# Patient Record
Sex: Female | Born: 1935 | Race: White | Hispanic: No | Smoking: Never smoker
Health system: Southern US, Community
[De-identification: ages and names within clinical notes are randomized; demographics above are authoritative.]

## PROBLEM LIST (undated history)

## (undated) DIAGNOSIS — Z9109 Other allergy status, other than to drugs and biological substances: Secondary | ICD-10-CM

## (undated) DIAGNOSIS — Z9221 Personal history of antineoplastic chemotherapy: Secondary | ICD-10-CM

## (undated) DIAGNOSIS — E7800 Pure hypercholesterolemia, unspecified: Secondary | ICD-10-CM

## (undated) DIAGNOSIS — I10 Essential (primary) hypertension: Secondary | ICD-10-CM

## (undated) DIAGNOSIS — C4490 Unspecified malignant neoplasm of skin, unspecified: Secondary | ICD-10-CM

## (undated) DIAGNOSIS — C189 Malignant neoplasm of colon, unspecified: Secondary | ICD-10-CM

## (undated) DIAGNOSIS — C50919 Malignant neoplasm of unspecified site of unspecified female breast: Secondary | ICD-10-CM

## (undated) DIAGNOSIS — C8580 Other specified types of non-Hodgkin lymphoma, unspecified site: Secondary | ICD-10-CM

## (undated) DIAGNOSIS — C8590 Non-Hodgkin lymphoma, unspecified, unspecified site: Secondary | ICD-10-CM

## (undated) HISTORY — DX: Malignant neoplasm of unspecified site of unspecified female breast: C50.919

## (undated) HISTORY — DX: Other specified types of non-hodgkin lymphoma, unspecified site: C85.80

## (undated) HISTORY — DX: Pure hypercholesterolemia, unspecified: E78.00

## (undated) HISTORY — DX: Essential (primary) hypertension: I10

## (undated) HISTORY — PX: COLONOSCOPY: SHX174

## (undated) HISTORY — PX: DIAGNOSTIC MAMMOGRAM: HXRAD719

## (undated) HISTORY — DX: Non-Hodgkin lymphoma, unspecified, unspecified site: C85.90

## (undated) HISTORY — DX: Malignant neoplasm of colon, unspecified: C18.9

---

## 2001-07-20 DIAGNOSIS — C8590 Non-Hodgkin lymphoma, unspecified, unspecified site: Secondary | ICD-10-CM

## 2001-07-20 DIAGNOSIS — Z9221 Personal history of antineoplastic chemotherapy: Secondary | ICD-10-CM

## 2001-07-20 DIAGNOSIS — C189 Malignant neoplasm of colon, unspecified: Secondary | ICD-10-CM

## 2001-07-20 HISTORY — DX: Malignant neoplasm of colon, unspecified: C18.9

## 2001-07-20 HISTORY — PX: LYMPH NODE DISSECTION: SHX5087

## 2001-07-20 HISTORY — DX: Non-Hodgkin lymphoma, unspecified, unspecified site: C85.90

## 2001-07-20 HISTORY — DX: Personal history of antineoplastic chemotherapy: Z92.21

## 2004-05-05 ENCOUNTER — Ambulatory Visit: Payer: Self-pay | Admitting: Oncology

## 2004-05-20 ENCOUNTER — Ambulatory Visit: Payer: Self-pay | Admitting: Oncology

## 2004-06-20 ENCOUNTER — Ambulatory Visit: Payer: Self-pay | Admitting: Oncology

## 2004-06-25 ENCOUNTER — Ambulatory Visit: Payer: Self-pay | Admitting: Oncology

## 2004-07-20 ENCOUNTER — Ambulatory Visit: Payer: Self-pay | Admitting: Oncology

## 2004-08-20 ENCOUNTER — Ambulatory Visit: Payer: Self-pay | Admitting: Oncology

## 2004-10-23 ENCOUNTER — Ambulatory Visit: Payer: Self-pay | Admitting: Oncology

## 2004-11-17 ENCOUNTER — Ambulatory Visit: Payer: Self-pay | Admitting: Oncology

## 2005-01-22 ENCOUNTER — Ambulatory Visit: Payer: Self-pay | Admitting: Oncology

## 2005-01-29 ENCOUNTER — Ambulatory Visit: Payer: Self-pay | Admitting: Oncology

## 2005-06-04 ENCOUNTER — Ambulatory Visit: Payer: Self-pay | Admitting: Oncology

## 2005-06-19 ENCOUNTER — Ambulatory Visit: Payer: Self-pay | Admitting: Oncology

## 2005-07-20 DIAGNOSIS — C8580 Other specified types of non-Hodgkin lymphoma, unspecified site: Secondary | ICD-10-CM

## 2005-07-20 HISTORY — DX: Other specified types of non-hodgkin lymphoma, unspecified site: C85.80

## 2005-08-27 ENCOUNTER — Ambulatory Visit: Payer: Self-pay | Admitting: Oncology

## 2005-09-25 ENCOUNTER — Ambulatory Visit: Payer: Self-pay | Admitting: Oncology

## 2005-10-02 ENCOUNTER — Ambulatory Visit: Payer: Self-pay | Admitting: Oncology

## 2005-11-10 ENCOUNTER — Ambulatory Visit: Payer: Self-pay | Admitting: Gastroenterology

## 2005-11-12 ENCOUNTER — Ambulatory Visit: Payer: Self-pay | Admitting: Oncology

## 2005-11-17 ENCOUNTER — Ambulatory Visit: Payer: Self-pay | Admitting: General Surgery

## 2005-11-17 ENCOUNTER — Other Ambulatory Visit: Payer: Self-pay

## 2005-11-19 ENCOUNTER — Inpatient Hospital Stay: Payer: Self-pay | Admitting: General Surgery

## 2005-11-24 HISTORY — PX: COLON SURGERY: SHX602

## 2005-11-25 ENCOUNTER — Ambulatory Visit: Payer: Self-pay | Admitting: Oncology

## 2005-12-31 ENCOUNTER — Ambulatory Visit: Payer: Self-pay | Admitting: Oncology

## 2006-01-17 ENCOUNTER — Ambulatory Visit: Payer: Self-pay | Admitting: Oncology

## 2006-03-31 ENCOUNTER — Ambulatory Visit: Payer: Self-pay | Admitting: Oncology

## 2006-04-19 ENCOUNTER — Ambulatory Visit: Payer: Self-pay | Admitting: Oncology

## 2006-06-25 ENCOUNTER — Ambulatory Visit: Payer: Self-pay | Admitting: Oncology

## 2006-06-30 ENCOUNTER — Ambulatory Visit: Payer: Self-pay | Admitting: Oncology

## 2006-09-29 ENCOUNTER — Ambulatory Visit: Payer: Self-pay | Admitting: Oncology

## 2006-10-19 ENCOUNTER — Ambulatory Visit: Payer: Self-pay | Admitting: Oncology

## 2006-11-15 ENCOUNTER — Ambulatory Visit: Payer: Self-pay | Admitting: Oncology

## 2006-11-16 ENCOUNTER — Ambulatory Visit: Payer: Self-pay | Admitting: Gastroenterology

## 2006-11-18 ENCOUNTER — Ambulatory Visit: Payer: Self-pay | Admitting: Oncology

## 2006-12-21 ENCOUNTER — Ambulatory Visit: Payer: Self-pay | Admitting: Oncology

## 2007-01-18 ENCOUNTER — Ambulatory Visit: Payer: Self-pay | Admitting: Oncology

## 2007-04-20 ENCOUNTER — Ambulatory Visit: Payer: Self-pay | Admitting: Oncology

## 2007-05-21 ENCOUNTER — Ambulatory Visit: Payer: Self-pay | Admitting: Oncology

## 2007-06-20 ENCOUNTER — Ambulatory Visit: Payer: Self-pay | Admitting: Oncology

## 2007-06-28 ENCOUNTER — Ambulatory Visit: Payer: Self-pay | Admitting: Oncology

## 2007-07-05 ENCOUNTER — Ambulatory Visit: Payer: Self-pay | Admitting: Oncology

## 2007-07-21 ENCOUNTER — Ambulatory Visit: Payer: Self-pay | Admitting: Oncology

## 2007-11-17 ENCOUNTER — Ambulatory Visit: Payer: Self-pay | Admitting: Oncology

## 2007-11-18 ENCOUNTER — Ambulatory Visit: Payer: Self-pay | Admitting: Oncology

## 2007-11-21 ENCOUNTER — Ambulatory Visit: Payer: Self-pay | Admitting: Oncology

## 2007-12-19 ENCOUNTER — Ambulatory Visit: Payer: Self-pay | Admitting: Oncology

## 2008-04-19 ENCOUNTER — Ambulatory Visit: Payer: Self-pay | Admitting: Oncology

## 2008-05-15 ENCOUNTER — Ambulatory Visit: Payer: Self-pay | Admitting: Oncology

## 2008-05-20 ENCOUNTER — Ambulatory Visit: Payer: Self-pay | Admitting: Oncology

## 2008-06-19 ENCOUNTER — Ambulatory Visit: Payer: Self-pay | Admitting: Oncology

## 2008-07-20 ENCOUNTER — Ambulatory Visit: Payer: Self-pay | Admitting: Oncology

## 2008-11-19 ENCOUNTER — Ambulatory Visit: Payer: Self-pay | Admitting: Oncology

## 2008-11-20 ENCOUNTER — Ambulatory Visit: Payer: Self-pay | Admitting: Oncology

## 2008-12-18 ENCOUNTER — Ambulatory Visit: Payer: Self-pay | Admitting: Oncology

## 2009-04-19 ENCOUNTER — Ambulatory Visit: Payer: Self-pay | Admitting: Oncology

## 2009-05-07 ENCOUNTER — Ambulatory Visit: Payer: Self-pay | Admitting: Oncology

## 2009-05-20 ENCOUNTER — Ambulatory Visit: Payer: Self-pay | Admitting: Oncology

## 2009-06-19 ENCOUNTER — Ambulatory Visit: Payer: Self-pay | Admitting: Oncology

## 2009-09-17 ENCOUNTER — Ambulatory Visit: Payer: Self-pay | Admitting: Oncology

## 2009-10-18 ENCOUNTER — Ambulatory Visit: Payer: Self-pay | Admitting: Oncology

## 2009-11-09 ENCOUNTER — Ambulatory Visit: Payer: Self-pay | Admitting: Internal Medicine

## 2009-11-11 ENCOUNTER — Ambulatory Visit: Payer: Self-pay | Admitting: Family Medicine

## 2009-11-13 ENCOUNTER — Ambulatory Visit: Payer: Self-pay | Admitting: Internal Medicine

## 2009-11-21 ENCOUNTER — Ambulatory Visit: Payer: Self-pay | Admitting: Oncology

## 2009-11-25 ENCOUNTER — Ambulatory Visit: Payer: Self-pay | Admitting: Oncology

## 2010-02-27 ENCOUNTER — Ambulatory Visit: Payer: Self-pay | Admitting: Gastroenterology

## 2010-03-20 ENCOUNTER — Ambulatory Visit: Payer: Self-pay | Admitting: Oncology

## 2010-03-25 ENCOUNTER — Ambulatory Visit: Payer: Self-pay | Admitting: Oncology

## 2010-04-19 ENCOUNTER — Ambulatory Visit: Payer: Self-pay | Admitting: Oncology

## 2010-07-23 ENCOUNTER — Ambulatory Visit: Payer: Self-pay | Admitting: Oncology

## 2010-08-06 ENCOUNTER — Ambulatory Visit: Payer: Self-pay | Admitting: Oncology

## 2010-08-20 ENCOUNTER — Ambulatory Visit: Payer: Self-pay | Admitting: Oncology

## 2010-11-25 ENCOUNTER — Ambulatory Visit: Payer: Self-pay | Admitting: Oncology

## 2011-02-04 ENCOUNTER — Ambulatory Visit: Payer: Self-pay | Admitting: Oncology

## 2011-02-05 LAB — CEA: CEA: 2.4 ng/mL (ref 0.0–4.7)

## 2011-02-18 ENCOUNTER — Ambulatory Visit: Payer: Self-pay | Admitting: Oncology

## 2011-09-11 ENCOUNTER — Ambulatory Visit: Payer: Self-pay | Admitting: Oncology

## 2011-09-11 LAB — CBC CANCER CENTER
Basophil #: 0 x10 3/mm (ref 0.0–0.1)
Basophil %: 0.9 %
Eosinophil #: 0.3 x10 3/mm (ref 0.0–0.7)
Eosinophil %: 7.3 %
Lymphocyte %: 33.7 %
MCH: 31.2 pg (ref 26.0–34.0)
Monocyte #: 0.4 x10 3/mm (ref 0.0–0.7)
Neutrophil #: 2.2 x10 3/mm (ref 1.4–6.5)
Neutrophil %: 47.8 %
Platelet: 208 x10 3/mm (ref 150–440)
RBC: 4.16 10*6/uL (ref 3.80–5.20)
WBC: 4.3 x10 3/mm (ref 3.6–11.0)

## 2011-09-11 LAB — COMPREHENSIVE METABOLIC PANEL
Anion Gap: 6 — ABNORMAL LOW (ref 7–16)
EGFR (African American): 57 — ABNORMAL LOW
EGFR (Non-African Amer.): 47 — ABNORMAL LOW
Glucose: 102 mg/dL — ABNORMAL HIGH (ref 65–99)
SGOT(AST): 17 U/L (ref 15–37)
SGPT (ALT): 20 U/L
Sodium: 140 mmol/L (ref 136–145)

## 2011-09-12 LAB — CEA: CEA: 2.6 ng/mL (ref 0.0–4.7)

## 2011-09-18 ENCOUNTER — Ambulatory Visit: Payer: Self-pay | Admitting: Oncology

## 2011-11-26 ENCOUNTER — Ambulatory Visit: Payer: Self-pay | Admitting: Oncology

## 2012-04-08 ENCOUNTER — Ambulatory Visit: Payer: Self-pay | Admitting: Oncology

## 2012-04-08 LAB — CBC CANCER CENTER
Basophil #: 0.1 x10 3/mm (ref 0.0–0.1)
Basophil %: 1.5 %
Eosinophil #: 0.5 x10 3/mm (ref 0.0–0.7)
Lymphocyte #: 1.5 x10 3/mm (ref 1.0–3.6)
Lymphocyte %: 36.5 %
MCHC: 33.7 g/dL (ref 32.0–36.0)
Monocyte %: 9.4 %
Platelet: 197 x10 3/mm (ref 150–440)
RBC: 4.07 10*6/uL (ref 3.80–5.20)
RDW: 13.5 % (ref 11.5–14.5)
WBC: 4.1 x10 3/mm (ref 3.6–11.0)

## 2012-04-08 LAB — COMPREHENSIVE METABOLIC PANEL
Albumin: 3.3 g/dL — ABNORMAL LOW (ref 3.4–5.0)
Anion Gap: 5 — ABNORMAL LOW (ref 7–16)
BUN: 19 mg/dL — ABNORMAL HIGH (ref 7–18)
Calcium, Total: 8.9 mg/dL (ref 8.5–10.1)
Chloride: 101 mmol/L (ref 98–107)
EGFR (Non-African Amer.): 48 — ABNORMAL LOW
Glucose: 102 mg/dL — ABNORMAL HIGH (ref 65–99)
SGOT(AST): 18 U/L (ref 15–37)
SGPT (ALT): 33 U/L (ref 12–78)
Total Protein: 7 g/dL (ref 6.4–8.2)

## 2012-04-08 LAB — LACTATE DEHYDROGENASE: LDH: 153 U/L (ref 81–246)

## 2012-04-19 ENCOUNTER — Ambulatory Visit: Payer: Self-pay | Admitting: Oncology

## 2012-10-07 ENCOUNTER — Ambulatory Visit: Payer: Self-pay | Admitting: Oncology

## 2012-10-07 LAB — CBC CANCER CENTER
Basophil #: 0.1 x10 3/mm (ref 0.0–0.1)
Eosinophil %: 12.3 %
Lymphocyte #: 1.3 x10 3/mm (ref 1.0–3.6)
Lymphocyte %: 33.3 %
MCHC: 33.4 g/dL (ref 32.0–36.0)
MCV: 94 fL (ref 80–100)
Monocyte %: 9.3 %
Neutrophil #: 1.6 x10 3/mm (ref 1.4–6.5)
Neutrophil %: 43.8 %
Platelet: 206 x10 3/mm (ref 150–440)
RBC: 4.18 10*6/uL (ref 3.80–5.20)
RDW: 13.1 % (ref 11.5–14.5)
WBC: 3.8 x10 3/mm (ref 3.6–11.0)

## 2012-10-07 LAB — COMPREHENSIVE METABOLIC PANEL
Albumin: 3.4 g/dL (ref 3.4–5.0)
Chloride: 101 mmol/L (ref 98–107)
Co2: 33 mmol/L — ABNORMAL HIGH (ref 21–32)
EGFR (Non-African Amer.): 52 — ABNORMAL LOW
Glucose: 106 mg/dL — ABNORMAL HIGH (ref 65–99)
Osmolality: 285 (ref 275–301)
Potassium: 3.7 mmol/L (ref 3.5–5.1)
SGOT(AST): 20 U/L (ref 15–37)
SGPT (ALT): 30 U/L (ref 12–78)
Sodium: 141 mmol/L (ref 136–145)
Total Protein: 7.2 g/dL (ref 6.4–8.2)

## 2012-10-18 ENCOUNTER — Ambulatory Visit: Payer: Self-pay | Admitting: Oncology

## 2012-11-29 ENCOUNTER — Ambulatory Visit: Payer: Self-pay | Admitting: Oncology

## 2013-05-12 ENCOUNTER — Ambulatory Visit: Payer: Self-pay | Admitting: Oncology

## 2013-05-12 LAB — CBC CANCER CENTER
Basophil %: 1.2 %
HCT: 39 % (ref 35.0–47.0)
HGB: 12.7 g/dL (ref 12.0–16.0)
Lymphocyte %: 32.4 %
MCH: 30.9 pg (ref 26.0–34.0)
MCHC: 32.5 g/dL (ref 32.0–36.0)
MCV: 95 fL (ref 80–100)
RDW: 13.1 % (ref 11.5–14.5)
WBC: 3.8 x10 3/mm (ref 3.6–11.0)

## 2013-05-12 LAB — COMPREHENSIVE METABOLIC PANEL
Bilirubin,Total: 0.4 mg/dL (ref 0.2–1.0)
Calcium, Total: 9 mg/dL (ref 8.5–10.1)
EGFR (Non-African Amer.): 47 — ABNORMAL LOW
Osmolality: 282 (ref 275–301)
Sodium: 139 mmol/L (ref 136–145)
Total Protein: 7 g/dL (ref 6.4–8.2)

## 2013-05-20 ENCOUNTER — Ambulatory Visit: Payer: Self-pay | Admitting: Oncology

## 2013-11-23 DIAGNOSIS — E7800 Pure hypercholesterolemia, unspecified: Secondary | ICD-10-CM | POA: Insufficient documentation

## 2013-11-23 DIAGNOSIS — Z85038 Personal history of other malignant neoplasm of large intestine: Secondary | ICD-10-CM | POA: Insufficient documentation

## 2013-11-23 DIAGNOSIS — I10 Essential (primary) hypertension: Secondary | ICD-10-CM | POA: Insufficient documentation

## 2013-11-23 DIAGNOSIS — J40 Bronchitis, not specified as acute or chronic: Secondary | ICD-10-CM | POA: Insufficient documentation

## 2013-11-30 ENCOUNTER — Ambulatory Visit: Payer: Self-pay | Admitting: Oncology

## 2014-03-09 ENCOUNTER — Ambulatory Visit: Payer: Self-pay | Admitting: Oncology

## 2014-03-09 LAB — URINALYSIS, COMPLETE
Bacteria: NEGATIVE
Bilirubin,UR: NEGATIVE
Blood: NEGATIVE
GLUCOSE, UR: NEGATIVE
KETONE: NEGATIVE
Leukocyte Esterase: NEGATIVE
NITRITE: NEGATIVE
PH: 6 (ref 5.0–8.0)
SPECIFIC GRAVITY: 1.005 (ref 1.000–1.030)

## 2014-03-09 LAB — CBC CANCER CENTER
Basophil #: 0 x10 3/mm (ref 0.0–0.1)
Basophil %: 1.2 %
Eosinophil #: 0.5 x10 3/mm (ref 0.0–0.7)
Eosinophil %: 14 %
HCT: 37.7 % (ref 35.0–47.0)
HGB: 12.6 g/dL (ref 12.0–16.0)
LYMPHS PCT: 32 %
Lymphocyte #: 1.1 x10 3/mm (ref 1.0–3.6)
MCH: 31.8 pg (ref 26.0–34.0)
MCHC: 33.4 g/dL (ref 32.0–36.0)
MCV: 95 fL (ref 80–100)
MONO ABS: 0.4 x10 3/mm (ref 0.2–0.9)
Monocyte %: 11.2 %
NEUTROS PCT: 41.6 %
Neutrophil #: 1.5 x10 3/mm (ref 1.4–6.5)
Platelet: 192 x10 3/mm (ref 150–440)
RBC: 3.97 10*6/uL (ref 3.80–5.20)
RDW: 13.4 % (ref 11.5–14.5)
WBC: 3.5 x10 3/mm — ABNORMAL LOW (ref 3.6–11.0)

## 2014-03-09 LAB — COMPREHENSIVE METABOLIC PANEL
AST: 17 U/L (ref 15–37)
Albumin: 3.4 g/dL (ref 3.4–5.0)
Alkaline Phosphatase: 69 U/L
Anion Gap: 6 — ABNORMAL LOW (ref 7–16)
BILIRUBIN TOTAL: 0.5 mg/dL (ref 0.2–1.0)
BUN: 19 mg/dL — ABNORMAL HIGH (ref 7–18)
CALCIUM: 9 mg/dL (ref 8.5–10.1)
CREATININE: 0.95 mg/dL (ref 0.60–1.30)
Chloride: 100 mmol/L (ref 98–107)
Co2: 30 mmol/L (ref 21–32)
EGFR (African American): >60
EGFR (Non-African Amer.): 55 — ABNORMAL LOW
Glucose: 98 mg/dL (ref 65–99)
OSMOLALITY: 274 (ref 275–301)
Potassium: 3.9 mmol/L (ref 3.5–5.1)
SGPT (ALT): 24 U/L
Sodium: 136 mmol/L (ref 136–145)
Total Protein: 7.1 g/dL (ref 6.4–8.2)

## 2014-03-09 LAB — LACTATE DEHYDROGENASE: LDH: 143 U/L (ref 81–246)

## 2014-03-20 ENCOUNTER — Ambulatory Visit: Payer: Self-pay | Admitting: Oncology

## 2014-05-18 ENCOUNTER — Ambulatory Visit: Payer: Self-pay | Admitting: Oncology

## 2014-05-18 LAB — CBC CANCER CENTER
BASOS ABS: 0.1 x10 3/mm (ref 0.0–0.1)
BASOS PCT: 1 %
Eosinophil #: 0.5 x10 3/mm (ref 0.0–0.7)
Eosinophil %: 7.2 %
HCT: 39.5 % (ref 35.0–47.0)
HGB: 13.3 g/dL (ref 12.0–16.0)
Lymphocyte #: 1.5 x10 3/mm (ref 1.0–3.6)
Lymphocyte %: 22.1 %
MCH: 31.6 pg (ref 26.0–34.0)
MCHC: 33.7 g/dL (ref 32.0–36.0)
MCV: 94 fL (ref 80–100)
MONOS PCT: 8.2 %
Monocyte #: 0.6 x10 3/mm (ref 0.2–0.9)
Neutrophil #: 4.1 x10 3/mm (ref 1.4–6.5)
Neutrophil %: 61.5 %
Platelet: 208 x10 3/mm (ref 150–440)
RBC: 4.21 10*6/uL (ref 3.80–5.20)
RDW: 12.9 % (ref 11.5–14.5)
WBC: 6.7 x10 3/mm (ref 3.6–11.0)

## 2014-05-18 LAB — COMPREHENSIVE METABOLIC PANEL
ANION GAP: 8 (ref 7–16)
Albumin: 3.6 g/dL (ref 3.4–5.0)
Alkaline Phosphatase: 80 U/L
BUN: 17 mg/dL (ref 7–18)
Bilirubin,Total: 0.6 mg/dL (ref 0.2–1.0)
CREATININE: 1.1 mg/dL (ref 0.60–1.30)
Calcium, Total: 9.4 mg/dL (ref 8.5–10.1)
Chloride: 100 mmol/L (ref 98–107)
Co2: 30 mmol/L (ref 21–32)
EGFR (African American): >60
GFR CALC NON AF AMER: 50 — AB
Glucose: 107 mg/dL — ABNORMAL HIGH (ref 65–99)
Osmolality: 278 (ref 275–301)
Potassium: 3.4 mmol/L — ABNORMAL LOW (ref 3.5–5.1)
SGOT(AST): 17 U/L (ref 15–37)
SGPT (ALT): 23 U/L
Sodium: 138 mmol/L (ref 136–145)
Total Protein: 7.4 g/dL (ref 6.4–8.2)

## 2014-05-18 LAB — URINALYSIS, COMPLETE
Glucose,UR: NEGATIVE
Nitrite: NEGATIVE
PH: 5.5 (ref 5.0–8.0)
Protein: 100
Specific Gravity: 1.02 (ref 1.000–1.030)

## 2014-05-19 LAB — URINE CULTURE

## 2014-05-20 ENCOUNTER — Ambulatory Visit: Payer: Self-pay | Admitting: Oncology

## 2014-05-21 LAB — CEA: CEA: 2.6 ng/mL (ref 0.0–4.7)

## 2014-06-19 ENCOUNTER — Ambulatory Visit: Payer: Self-pay | Admitting: Oncology

## 2014-11-12 ENCOUNTER — Other Ambulatory Visit: Payer: Self-pay | Admitting: Oncology

## 2014-11-12 DIAGNOSIS — Z85038 Personal history of other malignant neoplasm of large intestine: Secondary | ICD-10-CM

## 2014-11-12 DIAGNOSIS — C8299 Follicular lymphoma, unspecified, extranodal and solid organ sites: Secondary | ICD-10-CM

## 2014-11-15 ENCOUNTER — Other Ambulatory Visit: Payer: Self-pay | Admitting: *Deleted

## 2014-11-23 ENCOUNTER — Inpatient Hospital Stay: Payer: Medicare Other | Attending: Oncology

## 2014-11-23 ENCOUNTER — Other Ambulatory Visit: Payer: Self-pay | Admitting: Oncology

## 2014-11-23 ENCOUNTER — Ambulatory Visit: Payer: Self-pay | Admitting: Oncology

## 2014-11-23 ENCOUNTER — Inpatient Hospital Stay (HOSPITAL_BASED_OUTPATIENT_CLINIC_OR_DEPARTMENT_OTHER): Payer: Medicare Other | Admitting: Oncology

## 2014-11-23 VITALS — BP 134/67 | HR 93 | Temp 97.4°F | Resp 20 | Wt 147.5 lb

## 2014-11-23 DIAGNOSIS — Z9221 Personal history of antineoplastic chemotherapy: Secondary | ICD-10-CM

## 2014-11-23 DIAGNOSIS — I10 Essential (primary) hypertension: Secondary | ICD-10-CM | POA: Diagnosis not present

## 2014-11-23 DIAGNOSIS — Z800 Family history of malignant neoplasm of digestive organs: Secondary | ICD-10-CM | POA: Insufficient documentation

## 2014-11-23 DIAGNOSIS — C8299 Follicular lymphoma, unspecified, extranodal and solid organ sites: Secondary | ICD-10-CM

## 2014-11-23 DIAGNOSIS — Z7982 Long term (current) use of aspirin: Secondary | ICD-10-CM | POA: Insufficient documentation

## 2014-11-23 DIAGNOSIS — Z79899 Other long term (current) drug therapy: Secondary | ICD-10-CM

## 2014-11-23 DIAGNOSIS — Z8572 Personal history of non-Hodgkin lymphomas: Secondary | ICD-10-CM | POA: Diagnosis present

## 2014-11-23 DIAGNOSIS — Z803 Family history of malignant neoplasm of breast: Secondary | ICD-10-CM | POA: Insufficient documentation

## 2014-11-23 DIAGNOSIS — E780 Pure hypercholesterolemia: Secondary | ICD-10-CM | POA: Insufficient documentation

## 2014-11-23 DIAGNOSIS — Z801 Family history of malignant neoplasm of trachea, bronchus and lung: Secondary | ICD-10-CM | POA: Insufficient documentation

## 2014-11-23 DIAGNOSIS — Z85038 Personal history of other malignant neoplasm of large intestine: Secondary | ICD-10-CM

## 2014-11-23 DIAGNOSIS — Z0000 Encounter for general adult medical examination without abnormal findings: Secondary | ICD-10-CM

## 2014-11-23 LAB — CBC WITH DIFFERENTIAL/PLATELET
Basophils Absolute: 0.1 10*3/uL (ref 0–0.1)
Basophils Relative: 1 %
EOS ABS: 0.6 10*3/uL (ref 0–0.7)
EOS PCT: 13 %
HCT: 41.3 % (ref 35.0–47.0)
HEMOGLOBIN: 13.9 g/dL (ref 12.0–16.0)
LYMPHS ABS: 1.6 10*3/uL (ref 1.0–3.6)
Lymphocytes Relative: 36 %
MCH: 30.7 pg (ref 26.0–34.0)
MCHC: 33.6 g/dL (ref 32.0–36.0)
MCV: 91.2 fL (ref 80.0–100.0)
MONO ABS: 0.4 10*3/uL (ref 0.2–0.9)
Monocytes Relative: 9 %
Neutro Abs: 1.9 10*3/uL (ref 1.4–6.5)
Neutrophils Relative %: 41 %
Platelets: 224 10*3/uL (ref 150–440)
RBC: 4.52 MIL/uL (ref 3.80–5.20)
RDW: 13.5 % (ref 11.5–14.5)
WBC: 4.5 10*3/uL (ref 3.6–11.0)

## 2014-11-23 LAB — COMPREHENSIVE METABOLIC PANEL
ALBUMIN: 4.1 g/dL (ref 3.5–5.0)
ALK PHOS: 74 U/L (ref 38–126)
ALT: 14 U/L (ref 14–54)
AST: 21 U/L (ref 15–41)
Anion gap: 9 (ref 5–15)
BILIRUBIN TOTAL: 0.6 mg/dL (ref 0.3–1.2)
BUN: 26 mg/dL — ABNORMAL HIGH (ref 6–20)
CO2: 29 mmol/L (ref 22–32)
Calcium: 9.3 mg/dL (ref 8.9–10.3)
Chloride: 100 mmol/L — ABNORMAL LOW (ref 101–111)
Creatinine, Ser: 1.15 mg/dL — ABNORMAL HIGH (ref 0.44–1.00)
GFR calc Af Amer: 50 mL/min — ABNORMAL LOW (ref >60)
GFR calc non Af Amer: 43 mL/min — ABNORMAL LOW (ref >60)
GLUCOSE: 110 mg/dL — AB (ref 65–99)
POTASSIUM: 3.6 mmol/L (ref 3.5–5.1)
SODIUM: 138 mmol/L (ref 135–145)
Total Protein: 7.3 g/dL (ref 6.5–8.1)

## 2014-11-23 LAB — LACTATE DEHYDROGENASE: LDH: 138 U/L (ref 98–192)

## 2014-11-23 NOTE — Progress Notes (Signed)
Pt has been seeing Dr Janice [name] for Non Hodgkins Lymphoma and Colon Cancer. She states she is due for her mammogram now, her last one was in May last year, she said Dr Janice [name] always orders her mammograms. Pt is switching to Dr Janice [name] in order to stay in the [facility]. Her last PET/CT was in Jan 2102. Her last colonoscopy was with Dr Janice [name] in Aug 2011. Denies any night sweats or fever. Denies any abd pain. BMs are regular. No visible blood.

## 2014-11-23 NOTE — Progress Notes (Signed)
[place]  Telephone:([phone]) [phone] Fax:(336) [phone]  ID: Janice [name] OB: 12-30-1928  MR#: [id number]  HKV#:[id number]  No care team member to display  CHIEF COMPLAINT:  Chief Complaint  Patient presents with  . Follow-up    INTERVAL HISTORY: Patient returns to clinic today for routine six-month follow-up. She currently feels well and is asymptomatic. She has no neurologic complaints. She denies any pain. She denies any fevers or night sweats. She has no chest pain or shortness of breath. She denies any nausea, vomiting, constipation, or diarrhea. She denies any melena. She has no urinary complaints. Patient feels at her baseline and offers no specific complaints today.  REVIEW OF SYSTEMS:   Review of Systems  Constitutional: Negative.   Respiratory: Negative.   Gastrointestinal: Negative.     As per HPI. Otherwise, a complete review of systems is negatve.  PAST MEDICAL HISTORY: Past Medical History  Diagnosis Date  . Large cell lymphoma     Diffuse large cell lymphoma, completed CHOP and Rituxan in March 2004  . Colon cancer     adenocarcinoma of the ascending colon  . High cholesterol   . Hypertension     PAST SURGICAL HISTORY: Past Surgical History  Procedure Laterality Date  . Colonoscopy      2012  . Diagnostic mammogram      May 2014    FAMILY HISTORY Family History  Problem Relation Age of Onset  . Cancer Other     family history of colon cancer, breast cancer, lung cancer       ADVANCED DIRECTIVES:    HEALTH MAINTENANCE: History  Substance Use Topics  . Smoking status: Not on file  . Smokeless tobacco: Not on file  . Alcohol Use: Not on file     Colonoscopy:  PAP:  Bone density:  Lipid panel:  Allergies no known allergies  Current Outpatient Prescriptions  Medication Sig Dispense Refill  . amLODipine (NORVASC) 2.5 MG tablet Take 2.5 mg by mouth daily.    Marland Kitchen aspirin 81 MG tablet Take 81 mg by mouth daily.    .  Calcium-Vitamin D (CALTRATE 600 PLUS-VIT D PO) Take 1 tablet by mouth 2 (two) times daily.    Marland Kitchen lisinopril (PRINIVIL,ZESTRIL) 20 MG tablet Take 20 mg by mouth daily.    . Multiple Vitamins-Minerals (MULTIVITAMIN WITH MINERALS) tablet Take 1 tablet by mouth daily.    Marland Kitchen amlodipine-atorvastatin (CADUET) 10-10 MG per tablet Take 1 tablet by mouth daily.     No current facility-administered medications for this visit.    OBJECTIVE: Filed Vitals:   11/23/14 1231  BP: 134/67  Pulse: 93  Temp: 97.4 F (36.3 C)  Resp: 20     There is no height on file to calculate BMI.    ECOG FS:0 - Asymptomatic  General: Well-developed, well-nourished, no acute distress. Eyes: anicteric sclera. Lungs: Clear to auscultation bilaterally. Heart: Regular rate and rhythm. No rubs, murmurs, or gallops. Abdomen: Soft, nontender, nondistended. No organomegaly noted, normoactive bowel sounds. Musculoskeletal: No edema, cyanosis, or clubbing. Neuro: Alert, answering all questions appropriately. Cranial nerves grossly intact. Skin: No rashes or petechiae noted. Psych: Normal affect.   LAB RESULTS:  Lab Results  Component Value Date   NA 138 11/23/2014   K 3.6 11/23/2014   CL 100* 11/23/2014   CO2 29 11/23/2014   GLUCOSE 110* 11/23/2014   BUN 26* 11/23/2014   CREATININE 1.15* 11/23/2014   CALCIUM 9.3 11/23/2014   PROT 7.3 11/23/2014  ALBUMIN 4.1 11/23/2014   AST 21 11/23/2014   ALT 14 11/23/2014   ALKPHOS 74 11/23/2014   BILITOT 0.6 11/23/2014   GFRNONAA 43* 11/23/2014   GFRAA 50* 11/23/2014    Lab Results  Component Value Date   WBC 4.5 11/23/2014   NEUTROABS 1.9 11/23/2014   HGB 13.9 11/23/2014   HCT 41.3 11/23/2014   MCV 91.2 11/23/2014   PLT 224 11/23/2014     STUDIES: No results found.  ASSESSMENT: Diffuse large B-cell lymphoma, stage I colon cancer.  PLAN:    1. B-cell lymphoma: Patient completed her treatments in March 2004. Her most recent PET scan completed in January 2012  did not reveal any evidence of recurrence. No intervention is needed at this time. 2. Colon cancer: No evidence of disease. She had her resection in May 2007.  Patient's most recent colonoscopy in August 2011 was reported as negative. Repeat in August 2016. Her colonoscopies are completed by Dr. Candace Cruise. 3. Health maintenance: Patient is due for her screening mammogram in May 2016 and this is been ordered. These can now be managed by her primary care physician. 4. Disposition: Patient is approximately 9 years removed from her diagnosis of colon cancer and 12 years from completing her treatments for lymphoma. No further follow-up is necessary. Patient has been instructed to keep close follow-up with her primary care physician.   Patient expressed understanding and was in agreement with this plan. She also understands that She can call clinic at any time with any questions, concerns, or complaints.   No matching staging information was found for the patient.  Lloyd Huger, MD   11/23/2014 1:46 PM

## 2014-11-24 LAB — CEA: CEA: 3 ng/mL (ref 0.0–4.7)

## 2014-12-11 ENCOUNTER — Ambulatory Visit
Admission: RE | Admit: 2014-12-11 | Discharge: 2014-12-11 | Disposition: A | Discharge status: Home / Self Care | Payer: Medicare Other | Source: Ambulatory Visit | Attending: Oncology | Admitting: Oncology

## 2014-12-11 DIAGNOSIS — Z1231 Encounter for screening mammogram for malignant neoplasm of breast: Secondary | ICD-10-CM | POA: Diagnosis present

## 2014-12-11 DIAGNOSIS — Z0000 Encounter for general adult medical examination without abnormal findings: Secondary | ICD-10-CM

## 2014-12-11 HISTORY — DX: Unspecified malignant neoplasm of skin, unspecified: C44.90

## 2015-07-19 DIAGNOSIS — Z8572 Personal history of non-Hodgkin lymphomas: Secondary | ICD-10-CM | POA: Insufficient documentation

## 2015-07-21 DIAGNOSIS — C50919 Malignant neoplasm of unspecified site of unspecified female breast: Secondary | ICD-10-CM

## 2015-07-21 HISTORY — DX: Malignant neoplasm of unspecified site of unspecified female breast: C50.919

## 2015-08-27 ENCOUNTER — Encounter: Payer: Self-pay | Admitting: *Deleted

## 2015-08-27 NOTE — Discharge Instructions (Signed)

## 2015-08-28 ENCOUNTER — Ambulatory Visit
Admission: RE | Admit: 2015-08-28 | Discharge: 2015-08-28 | Disposition: A | Discharge status: Home / Self Care | Payer: Medicare Other | Source: Ambulatory Visit | Attending: Gastroenterology | Admitting: Gastroenterology

## 2015-08-28 ENCOUNTER — Ambulatory Visit: Payer: Medicare Other | Admitting: Student in an Organized Health Care Education/Training Program

## 2015-08-28 ENCOUNTER — Encounter: Payer: Self-pay | Admitting: *Deleted

## 2015-08-28 ENCOUNTER — Encounter
Admission: RE | Disposition: A | Discharge status: Home / Self Care | Payer: Self-pay | Source: Ambulatory Visit | Attending: Gastroenterology

## 2015-08-28 DIAGNOSIS — K5730 Diverticulosis of large intestine without perforation or abscess without bleeding: Secondary | ICD-10-CM | POA: Diagnosis not present

## 2015-08-28 DIAGNOSIS — Z85038 Personal history of other malignant neoplasm of large intestine: Secondary | ICD-10-CM | POA: Diagnosis present

## 2015-08-28 DIAGNOSIS — Z1211 Encounter for screening for malignant neoplasm of colon: Secondary | ICD-10-CM | POA: Insufficient documentation

## 2015-08-28 DIAGNOSIS — I10 Essential (primary) hypertension: Secondary | ICD-10-CM | POA: Diagnosis not present

## 2015-08-28 DIAGNOSIS — D125 Benign neoplasm of sigmoid colon: Secondary | ICD-10-CM | POA: Diagnosis not present

## 2015-08-28 HISTORY — PX: COLONOSCOPY: SHX5424

## 2015-08-28 HISTORY — DX: Other allergy status, other than to drugs and biological substances: Z91.09

## 2015-08-28 SURGERY — COLONOSCOPY
Anesthesia: Monitor Anesthesia Care

## 2015-08-28 MED ORDER — PROPOFOL 10 MG/ML IV BOLUS
INTRAVENOUS | Status: DC | PRN
  Administered 2015-08-28: 20 mg via INTRAVENOUS
  Administered 2015-08-28: 80 mg via INTRAVENOUS
  Administered 2015-08-28 (×2): 30 mg via INTRAVENOUS
  Administered 2015-08-28 (×2): 20 mg via INTRAVENOUS

## 2015-08-28 MED ORDER — LACTATED RINGERS IV SOLN
INTRAVENOUS | Status: DC
  Administered 2015-08-28: 10:00:00 via INTRAVENOUS

## 2015-08-28 MED ORDER — LIDOCAINE HCL (CARDIAC) 20 MG/ML IV SOLN
INTRAVENOUS | Status: DC | PRN
Start: 2015-08-28 — End: 2015-08-28
  Administered 2015-08-28: 50 mg via INTRAVENOUS

## 2015-08-28 MED ORDER — LACTATED RINGERS IV SOLN: 500.0000 mL | INTRAVENOUS | Status: DC

## 2015-08-28 SURGICAL SUPPLY — 30 items
CANISTER SUCT 1200ML W/VALVE (MISCELLANEOUS) ×2 IMPLANT
FCP ESCP3.2XJMB 240X2.8X (MISCELLANEOUS)
FORCEPS BIOP RAD 4 LRG CAP 4 (CUTTING FORCEPS) ×2 IMPLANT
FORCEPS BIOP RJ4 240 W/NDL (MISCELLANEOUS)
FORCEPS ESCP3.2XJMB 240X2.8X (MISCELLANEOUS) IMPLANT
GOWN CVR UNV OPN BCK APRN NK (MISCELLANEOUS) ×1 IMPLANT
GOWN ISOL THUMB LOOP REG UNIV (MISCELLANEOUS) ×1
GOWN STRL REUS W/ TWL LRG LVL3 (GOWN DISPOSABLE) ×1 IMPLANT
GOWN STRL REUS W/TWL LRG LVL3 (GOWN DISPOSABLE) ×1
HEMOCLIP INSTINCT (CLIP) IMPLANT
INJECTOR VARIJECT VIN23 (MISCELLANEOUS) IMPLANT
KIT CO2 TUBING (TUBING) IMPLANT
KIT DEFENDO VALVE AND CONN (KITS) IMPLANT
KIT ENDO PROCEDURE OLY (KITS) ×2 IMPLANT
LIGATOR MULTIBAND 6SHOOTER MBL (MISCELLANEOUS) IMPLANT
MARKER SPOT ENDO TATTOO 5ML (MISCELLANEOUS) IMPLANT
PAD GROUND ADULT SPLIT (MISCELLANEOUS) IMPLANT
SNARE SHORT THROW 13M SML OVAL (MISCELLANEOUS) IMPLANT
SNARE SHORT THROW 30M LRG OVAL (MISCELLANEOUS) IMPLANT
SPOT EX ENDOSCOPIC TATTOO (MISCELLANEOUS)
SUCTION POLY TRAP 4CHAMBER (MISCELLANEOUS) IMPLANT
TRAP SUCTION POLY (MISCELLANEOUS) IMPLANT
TUBING CONN 6MMX3.1M (TUBING)
TUBING SUCTION CONN 0.25 STRL (TUBING) IMPLANT
UNDERPAD 30X60 958B10 (PK) (MISCELLANEOUS) IMPLANT
VALVE BIOPSY ENDO (VALVE) IMPLANT
VARIJECT INJECTOR VIN23 (MISCELLANEOUS)
WATER AUXILLARY (MISCELLANEOUS) IMPLANT
WATER STERILE IRR 250ML POUR (IV SOLUTION) IMPLANT
WATER STERILE IRR 500ML POUR (IV SOLUTION) IMPLANT

## 2015-08-28 NOTE — Anesthesia Postprocedure Evaluation (Signed)
Anesthesia Post Note  Patient: Janice [name]  Procedure(s) Performed: Procedure(s) (LRB): COLONOSCOPY (N/A)  Patient location during evaluation: PACU Anesthesia Type: MAC Level of consciousness: awake and alert Pain management: pain level controlled Vital Signs Assessment: post-procedure vital signs reviewed and stable Respiratory status: spontaneous breathing, nonlabored ventilation and respiratory function stable Cardiovascular status: blood pressure returned to baseline and stable Postop Assessment: no signs of nausea or vomiting Anesthetic complications: no    DANIEL D KOVACS

## 2015-08-28 NOTE — Transfer of Care (Signed)
Immediate Anesthesia Transfer of Care Note  Patient: Janice [name]  Procedure(s) Performed: Procedure(s): COLONOSCOPY (N/A)  Patient Location: PACU  Anesthesia Type: MAC  Level of Consciousness: awake, alert  and patient cooperative  Airway and Oxygen Therapy: Patient Spontanous Breathing and Patient connected to supplemental oxygen  Post-op Assessment: Post-op Vital signs reviewed, Patient's Cardiovascular Status Stable, Respiratory Function Stable, Patent Airway and No signs of Nausea or vomiting  Post-op Vital Signs: Reviewed and stable  Complications: No apparent anesthesia complications

## 2015-08-28 NOTE — Anesthesia Preprocedure Evaluation (Signed)
Anesthesia Evaluation  Patient identified by MRN, date of birth, ID band Patient awake    Reviewed: Allergy & Precautions, H&P , NPO status , Patient's Chart, lab work & pertinent test results, reviewed documented beta blocker date and time   Airway Mallampati: II  TM Distance: >3 FB Neck ROM: full    Dental no notable dental hx.    Pulmonary neg pulmonary ROS,    Pulmonary exam normal breath sounds clear to auscultation       Cardiovascular Exercise Tolerance: Good hypertension, On Medications  Rhythm:regular Rate:Normal     Neuro/Psych negative neurological ROS  negative psych ROS   GI/Hepatic negative GI ROS, Neg liver ROS,   Endo/Other  negative endocrine ROS  Renal/GU negative Renal ROS  negative genitourinary   Musculoskeletal   Abdominal   Peds  Hematology negative hematology ROS (+)   Anesthesia Other Findings   Reproductive/Obstetrics negative OB ROS                             Anesthesia Physical Anesthesia Plan  ASA: II  Anesthesia Plan: MAC   Post-op Pain Management:    Induction:   Airway Management Planned:   Additional Equipment:   Intra-op Plan:   Post-operative Plan:   Informed Consent: I have reviewed the patients History and Physical, chart, labs and discussed the procedure including the risks, benefits and alternatives for the proposed anesthesia with the patient or authorized representative who has indicated his/her understanding and acceptance.     Plan Discussed with: CRNA  Anesthesia Plan Comments:         Anesthesia Quick Evaluation  

## 2015-08-28 NOTE — Anesthesia Procedure Notes (Signed)
Procedure Name: MAC Performed by: Barney Russomanno Pre-anesthesia Checklist: Patient identified, Emergency Drugs available, Suction available, Timeout performed and Patient being monitored Patient Re-evaluated:Patient Re-evaluated prior to inductionOxygen Delivery Method: Nasal cannula Placement Confirmation: positive ETCO2     

## 2015-08-28 NOTE — H&P (Signed)
  Date of Initial H&P:08/22/2015  History reviewed, patient examined, no change in status, stable for surgery.

## 2015-08-28 NOTE — Op Note (Signed)
[facility] Patient Name: Janice [name] Procedure Date: 08/28/2015 11:02 AM MRN: [id number] Account #: [id number] Date of Birth: [birth year] Admit Type: Outpatient Age: 82 Room: [facility] OR ROOM 01 Gender: Female Note Status: Finalized Procedure:         Colonoscopy Indications:       Personal history of malignant neoplasm of the colon Providers:         Lupita Dawn. Candace Cruise, MD Referring MD:      Shirline Frees (Referring MD) Medicines:         Monitored Anesthesia Care Complications:     No immediate complications. Procedure:         Pre-Anesthesia Assessment:                    - Prior to the procedure, a History and Physical was                     performed, and patient medications, allergies and                     sensitivities were reviewed. The patient's tolerance of                     previous anesthesia was reviewed.                    - The risks and benefits of the procedure and the sedation                     options and risks were discussed with the patient. All                     questions were answered and informed consent was obtained.                    - After reviewing the risks and benefits, the patient was                     deemed in satisfactory condition to undergo the procedure.                    After obtaining informed consent, the colonoscope was                     passed under direct vision. Throughout the procedure, the                     patient's blood pressure, pulse, and oxygen saturations                     were monitored continuously. The Olympus CF H180AL                     colonoscope (S#: [id number]) was introduced through the anus                     and advanced to the the cecum, identified by appendiceal                     orifice and ileocecal valve. The colonoscopy was performed                     without difficulty. The patient tolerated the procedure  well. The quality of the bowel  preparation was fair. Findings:      A small polyp was found in the sigmoid colon in the distal sigmoid       colon. The polyp was sessile. The polyp was removed with a jumbo cold       forceps. Resection and retrieval were complete.      Multiple small and large-mouthed diverticula were found in the sigmoid       colon and in the descending colon.      The exam was otherwise without abnormality. Impression:        - One small polyp in the sigmoid colon in the distal                     sigmoid colon. Resected and retrieved.                    - Diverticulosis in the sigmoid colon and in the                     descending colon.                    - The examination was otherwise normal. Recommendation:    - Discharge patient to home.                    - Await pathology results.                    - The findings and recommendations were discussed with the                     patient. Procedure Code(s): --- Professional ---                    [phone], Colonoscopy, flexible; with biopsy, single or                     multiple Diagnosis Code(s): --- Professional ---                    D12.5, Benign neoplasm of sigmoid colon                    Z85.038, Personal history of other malignant neoplasm of                     large intestine                    K57.30, Diverticulosis of large intestine without                     perforation or abscess without bleeding CPT copyright 2014 American Medical Association. All rights reserved. The codes documented in this report are preliminary and upon coder review may  be revised to meet current compliance requirements. Hulen Luster, MD 08/28/2015 11:28:23 AM This report has been signed electronically. Number of Addenda: 0 Note Initiated On: 08/28/2015 11:02 AM Scope Withdrawal Time: 0 hours 5 minutes 3 seconds  Total Procedure Duration: 0 hours 11 minutes 29 seconds       [facility]

## 2015-08-29 ENCOUNTER — Encounter: Payer: Self-pay | Admitting: Gastroenterology

## 2015-08-30 LAB — SURGICAL PATHOLOGY

## 2015-12-28 ENCOUNTER — Encounter: Payer: Self-pay | Admitting: Gynecology

## 2015-12-28 ENCOUNTER — Ambulatory Visit
Admission: EM | Admit: 2015-12-28 | Discharge: 2015-12-28 | Disposition: A | Discharge status: Home / Self Care | Payer: Medicare Other | Attending: Family Medicine | Admitting: Family Medicine

## 2015-12-28 DIAGNOSIS — L03116 Cellulitis of left lower limb: Secondary | ICD-10-CM | POA: Diagnosis not present

## 2015-12-28 MED ORDER — CEPHALEXIN 500 MG PO CAPS: 500.0000 mg | ORAL_CAPSULE | Freq: Three times a day (TID) | ORAL | Status: DC

## 2015-12-28 NOTE — Discharge Instructions (Signed)

## 2015-12-28 NOTE — ED Notes (Signed)
Patient c/o swelling on left foot notice x 4 days ago. Per patient question insect bite.

## 2016-01-15 ENCOUNTER — Other Ambulatory Visit: Payer: Self-pay | Admitting: Oncology

## 2016-01-15 ENCOUNTER — Other Ambulatory Visit: Payer: Self-pay | Admitting: Family Medicine

## 2016-01-15 DIAGNOSIS — Z1231 Encounter for screening mammogram for malignant neoplasm of breast: Secondary | ICD-10-CM

## 2016-01-29 ENCOUNTER — Ambulatory Visit: Payer: Medicare Other

## 2016-02-05 ENCOUNTER — Ambulatory Visit
Admission: RE | Admit: 2016-02-05 | Discharge: 2016-02-05 | Disposition: A | Discharge status: Home / Self Care | Payer: Medicare Other | Source: Ambulatory Visit | Attending: Family Medicine | Admitting: Family Medicine

## 2016-02-05 DIAGNOSIS — Z1231 Encounter for screening mammogram for malignant neoplasm of breast: Secondary | ICD-10-CM | POA: Insufficient documentation

## 2016-02-05 DIAGNOSIS — R928 Other abnormal and inconclusive findings on diagnostic imaging of breast: Secondary | ICD-10-CM | POA: Diagnosis not present

## 2016-02-11 NOTE — ED Provider Notes (Signed)
[facility]    CSN: [id number] Arrival date & time: 12/28/15  1150  First Provider Contact:  First MD Initiated Contact with Patient 12/28/15 1224        History   Chief Complaint Chief Complaint  Patient presents with  . Insect Bite    HPI Janice [name] is a 82 y.o. female.   82 yo female with left foot skin pain and redness. Denies any injuries, falls, fevers, chills, rash, drainage.    The history is provided by the patient.    Past Medical History:  Diagnosis Date  . Colon cancer ([place]) 2003   adenocarcinoma of the ascending colon  . Environmental allergies   . High cholesterol   . Hypertension   . Large cell lymphoma ([place]) 2007   Diffuse large cell lymphoma, completed CHOP and Rituxan in March 2004  . Skin cancer     There are no active problems to display for this patient.   Past Surgical History:  Procedure Laterality Date  . COLON SURGERY  11/24/05   Cancer surgery  . COLONOSCOPY     2012  . COLONOSCOPY N/A 08/28/2015   Procedure: COLONOSCOPY;  Surgeon: Hulen Luster, MD;  Location: [place];  Service: Gastroenterology;  Laterality: N/A;  . DIAGNOSTIC MAMMOGRAM     May 2014  . LYMPH NODE DISSECTION  2003   neck - non-hodgkin's lymphoma    OB History    No data available       Home Medications    Prior to Admission medications   Medication Sig Start Date End Date Taking? Authorizing Provider  amLODipine (NORVASC) 2.5 MG tablet Take 2.5 mg by mouth daily.   Yes Historical Provider, MD  aspirin 81 MG tablet Take 81 mg by mouth daily.   Yes Historical Provider, MD  atorvastatin (LIPITOR) 10 MG tablet Take 10 mg by mouth daily.   Yes Historical Provider, MD  Calcium-Vitamin D (CALTRATE 600 PLUS-VIT D PO) Take 1 tablet by mouth 2 (two) times daily.   Yes Historical Provider, MD  diphenhydrAMINE (SOMINEX) 25 MG tablet Take 25 mg by mouth at bedtime as needed for allergies.   Yes Historical Provider, MD  lisinopril (PRINIVIL,ZESTRIL)  20 MG tablet Take 20 mg by mouth daily.   Yes Historical Provider, MD  Multiple Vitamins-Minerals (MULTIVITAMIN WITH MINERALS) tablet Take 1 tablet by mouth daily.   Yes Historical Provider, MD  cephALEXin (KEFLEX) 500 MG capsule Take 1 capsule (500 mg total) by mouth 3 (three) times daily. 12/28/15   Norval Gable, MD    Family History Family History  Problem Relation Age of Onset  . Cancer Other     family history of colon cancer, breast cancer, lung cancer  . Breast cancer Sister 32    half sister    Social History Social History  Substance Use Topics  . Smoking status: Never Smoker  . Smokeless tobacco: Not on file  . Alcohol use No     Allergies   Review of patient's allergies indicates no known allergies.   Review of Systems Review of Systems   Physical Exam Triage Vital Signs ED Triage Vitals  Enc Vitals Group     BP 12/28/15 1202 (!) 142/65     Pulse Rate 12/28/15 1202 81     Resp 12/28/15 1202 16     Temp 12/28/15 1202 98.2 F (36.8 C)     Temp Source 12/28/15 1202 Oral     SpO2 12/28/15 1202  98 %     Weight 12/28/15 1202 165 lb (74.8 kg)     Height 12/28/15 1202 5\' 3"  (1.6 m)     Head Circumference --      Peak Flow --      Pain Score 12/28/15 1206 1     Pain Loc --      Pain Edu? --      Excl. in [place]? --    No data found.   Updated Vital Signs BP (!) 142/65 (BP Location: Left Arm)   Pulse 81   Temp 98.2 F (36.8 C) (Oral)   Resp 16   Ht 5\' 3"  (1.6 m)   Wt 165 lb (74.8 kg)   SpO2 98%   BMI 29.23 kg/m   Visual Acuity Right Eye Distance:   Left Eye Distance:   Bilateral Distance:    Right Eye Near:   Left Eye Near:    Bilateral Near:     Physical Exam  Constitutional: She appears well-developed. No distress.  Skin: She is not diaphoretic. There is erythema (blanchable, warmtha and tenderness to skin on dorsum of foot).  Nursing note and vitals reviewed.    UC Treatments / Results  Labs (all labs ordered are listed, but only  abnormal results are displayed) Labs Reviewed - No data to display  EKG  EKG Interpretation None       Radiology No results found.  Procedures Procedures (including critical care time)  Medications Ordered in UC Medications - No data to display   Initial Impression / Assessment and Plan / UC Course  I have reviewed the triage vital signs and the nursing notes.  Pertinent labs & imaging results that were available during my care of the patient were reviewed by me and considered in my medical decision making (see chart for details).  Clinical Course      Final Clinical Impressions(s) / UC Diagnoses   Final diagnoses:  Cellulitis of foot, left    New Prescriptions Discharge Medication List as of 12/28/2015 12:38 PM    START taking these medications   Details  cephALEXin (KEFLEX) 500 MG capsule Take 1 capsule (500 mg total) by mouth 3 (three) times daily., Starting 12/28/2015, Until Discontinued, Normal      1.  diagnosis reviewed with patient 2. rx as per orders above; reviewed possible side effects, interactions, risks and benefits  3. Recommend supportive treatment with warm compresses, elevation 4. Follow-up prn if symptoms worsen or don't improve   Norval Gable, MD 02/11/16 2123

## 2016-02-13 ENCOUNTER — Other Ambulatory Visit: Payer: Self-pay | Admitting: Family Medicine

## 2016-02-13 DIAGNOSIS — R921 Mammographic calcification found on diagnostic imaging of breast: Secondary | ICD-10-CM

## 2016-02-27 ENCOUNTER — Ambulatory Visit
Admission: RE | Admit: 2016-02-27 | Discharge: 2016-02-27 | Disposition: A | Discharge status: Home / Self Care | Payer: Medicare Other | Source: Ambulatory Visit | Attending: Family Medicine | Admitting: Family Medicine

## 2016-02-27 DIAGNOSIS — R921 Mammographic calcification found on diagnostic imaging of breast: Secondary | ICD-10-CM

## 2016-03-03 ENCOUNTER — Other Ambulatory Visit: Payer: Self-pay | Admitting: Family Medicine

## 2016-03-03 DIAGNOSIS — R921 Mammographic calcification found on diagnostic imaging of breast: Secondary | ICD-10-CM

## 2016-03-18 ENCOUNTER — Ambulatory Visit
Admission: RE | Admit: 2016-03-18 | Discharge: 2016-03-18 | Disposition: A | Discharge status: Home / Self Care | Payer: Medicare Other | Source: Ambulatory Visit | Attending: Family Medicine | Admitting: Family Medicine

## 2016-03-18 DIAGNOSIS — D0511 Intraductal carcinoma in situ of right breast: Secondary | ICD-10-CM | POA: Diagnosis not present

## 2016-03-18 DIAGNOSIS — R921 Mammographic calcification found on diagnostic imaging of breast: Secondary | ICD-10-CM

## 2016-03-18 HISTORY — PX: BREAST BIOPSY: SHX20

## 2016-03-20 NOTE — Progress Notes (Signed)
  Oncology Nurse Navigator Documentation  Navigator Location: [facility] (03/20/16 1200) Navigator Encounter Type: Introductory phone call;Telephone (03/20/16 1200) Telephone: [name] Call;Appt Confirmation/Clarification (03/20/16 1200) Abnormal Finding Date: 02/27/16 (03/20/16 1200) Confirmed Diagnosis Date: 03/18/16 (03/20/16 1200)     Patient Visit Type: Initial (03/20/16 1200)                              Time Spent with Patient: 79 (03/20/16 1200)   Introduced IT trainer to patient.  Scheduled  Med/Onc consult with Dr. Grayland Ormond on 03/27/16, and surgical consult with Dr. Dahlia Byes on 03/31/16.  Patient has 2 sons who live close to her.  She drives, but tries not to drive a great distance from her home. She would prefer to have appointments in [place] if possible.

## 2016-03-26 DIAGNOSIS — D0511 Intraductal carcinoma in situ of right breast: Secondary | ICD-10-CM | POA: Insufficient documentation

## 2016-03-26 NOTE — Progress Notes (Signed)
Janice [name]  Telephone:(336) [phone] Fax:(336) [phone]  ID: Janice [name] OB: 10/17/1928  MR#: [id number]  [id number]  Patient Care Team: Janice Daisy, MD as PCP - General (Family Medicine)  CHIEF COMPLAINT: Right breast DCIS, high grade.  INTERVAL HISTORY: Patient was recently discharged from clinic, but returns today after screening mammogram and subsequent biopsy revealed right breast DCIS. She is anxious, but otherwise feels well. She has some slight tenderness at her biopsy site. She has no neurologic complaints. She denies any pain. She denies any fevers or night sweats. She has no chest pain or shortness of breath. She denies any nausea, vomiting, constipation, or diarrhea. She denies any melena. She has no urinary complaints. Patient offers no specific complaints today.  REVIEW OF SYSTEMS:   Review of Systems  Constitutional: Negative.  Negative for fever, malaise/fatigue and weight loss.  Respiratory: Negative.  Negative for cough and shortness of breath.   Cardiovascular: Negative.  Negative for chest pain and leg swelling.  Gastrointestinal: Negative.  Negative for abdominal pain.  Genitourinary: Negative.   Musculoskeletal: Negative.   Neurological: Negative.  Negative for weakness.  Psychiatric/Behavioral: The patient is nervous/anxious.     As per HPI. Otherwise, a complete review of systems is negative.  PAST MEDICAL HISTORY: Past Medical History:  Diagnosis Date  . Colon cancer ([place]) 2003   adenocarcinoma of the ascending colon  . Environmental allergies   . High cholesterol   . Hypertension   . Large cell lymphoma ([place]) 2007   Diffuse large cell lymphoma, completed CHOP and Rituxan in March 2004  . Skin cancer     PAST SURGICAL HISTORY: Past Surgical History:  Procedure Laterality Date  . BREAST BIOPSY Right 03/18/2016   path pending  . COLON SURGERY  11/24/05   Cancer surgery  . COLONOSCOPY     2012  . COLONOSCOPY N/A 08/28/2015     Procedure: COLONOSCOPY;  Surgeon: Janice Luster, MD;  Location: [facility];  Service: Gastroenterology;  Laterality: N/A;  . DIAGNOSTIC MAMMOGRAM     May 2014  . LYMPH NODE DISSECTION  2003   neck - non-hodgkin's lymphoma    FAMILY HISTORY Family History  Problem Relation Age of Onset  . Breast cancer Sister 1    half sister  . Cancer Other     family history of colon cancer, breast cancer, lung cancer  . Cancer Paternal Uncle     colon       ADVANCED DIRECTIVES:    HEALTH MAINTENANCE: Social History  Substance Use Topics  . Smoking status: Never Smoker  . Smokeless tobacco: Never Used  . Alcohol use No     Colonoscopy:  PAP:  Bone density:  Lipid panel:  No Known Allergies  Current Outpatient Prescriptions  Medication Sig Dispense Refill  . amLODipine (NORVASC) 2.5 MG tablet Take 2.5 mg by mouth daily.    Marland Kitchen atorvastatin (LIPITOR) 10 MG tablet Take 10 mg by mouth daily.    Marland Kitchen lisinopril (PRINIVIL,ZESTRIL) 20 MG tablet Take 20 mg by mouth daily.    . Multiple Vitamins-Minerals (MULTIVITAMIN WITH MINERALS) tablet Take 1 tablet by mouth daily.    Marland Kitchen aspirin 81 MG tablet Take 81 mg by mouth daily.    . diphenhydrAMINE (SOMINEX) 25 MG tablet Take 25 mg by mouth at bedtime as needed for allergies.     No current facility-administered medications for this visit.     OBJECTIVE: Vitals:   03/27/16 1140  BP: (!) 144/76  Pulse: 87  Temp: 97.8 F (36.6 C)     Body mass index is 29.69 kg/m.    ECOG FS:0 - Asymptomatic  General: Well-developed, well-nourished, no acute distress. Eyes: anicteric sclera. Lungs: Clear to auscultation bilaterally. Breasts: Mild right breast ecchymosis secondary to biopsy. No palpable lumps or masses. Heart: Regular rate and rhythm. No rubs, murmurs, or gallops. Abdomen: Soft, nontender, nondistended. No organomegaly noted, normoactive bowel sounds. Musculoskeletal: No edema, cyanosis, or clubbing. Neuro: Alert, answering all  questions appropriately. Cranial nerves grossly intact. Skin: No rashes or petechiae noted. Psych: Normal affect.   LAB RESULTS:  Lab Results  Component Value Date   NA 138 11/23/2014   K 3.6 11/23/2014   CL 100 (L) 11/23/2014   CO2 29 11/23/2014   GLUCOSE 110 (H) 11/23/2014   BUN 26 (H) 11/23/2014   CREATININE 1.15 (H) 11/23/2014   CALCIUM 9.3 11/23/2014   PROT 7.3 11/23/2014   ALBUMIN 4.1 11/23/2014   AST 21 11/23/2014   ALT 14 11/23/2014   ALKPHOS 74 11/23/2014   BILITOT 0.6 11/23/2014   GFRNONAA 43 (L) 11/23/2014   GFRAA 50 (L) 11/23/2014    Lab Results  Component Value Date   WBC 4.5 11/23/2014   NEUTROABS 1.9 11/23/2014   HGB 13.9 11/23/2014   HCT 41.3 11/23/2014   MCV 91.2 11/23/2014   PLT 224 11/23/2014     STUDIES: Mm Clip Placement Right  Result Date: 03/18/2016 CLINICAL DATA:  Status post stereotactic guided right breast biopsy EXAM: DIAGNOSTIC RIGHT MAMMOGRAM POST STEREOTACTIC BIOPSY COMPARISON:  Previous exam(s). FINDINGS: Mammographic images were obtained following stereotactic guided biopsy of indeterminate calcifications in the upper, outer right breast. Post biopsy mammogram demonstrates the cylinder-shaped biopsy marker in the expected location within the upper, outer right breast. IMPRESSION: Appropriate marker position as above. Final Assessment: Post Procedure Mammograms for Marker Placement Electronically Signed   By: Janice [name] M.D.   On: 03/18/2016 14:31   Mm Rt Breast Bx W Loc Dev 1st Lesion Image Bx Spec Stereo Guide  Addendum Date: 03/25/2016   ADDENDUM REPORT: 03/24/2016 12:50 ADDENDUM: Pathology of the right breast biopsy revealed BREAST, RIGHT, UPPER OUTER QUADRANT; STEREOTACTIC CORE BIOPSY: DUCTAL CARCINOMA IN SITU (DCIS), HIGH NUCLEAR GRADE WITH COMEDONECROSIS, ASSOCIATED WITH CALCIFICATIONS. Comment: High grade DCIS is present in multiple core fragments and measures at least 3 mm. The diagnosis was called to Janice [name] in the [facility] on 03/19/16 at 4:28 PM. Read-back was performed. This was found to be concordant by Dr. Enriqueta [name]. Recommendations:  Surgical referral. At the patient's request, pathology and recommendations were relayed to the patient by phone by Dr. Enriqueta [name]. The patient stated she has done well following the biopsy. Post biopsy instructions were reviewed. All of her questions were answered. She was informed that the nurse navigator for [facility] would contact her with referral information. She was encouraged to call the [facility] with any further questions or concerns. Results and recommendations were relayed to Al Pimple, RN, nurse navigator for [facility] [place], [place]. Appointments were made with Dr. Dahlia Byes on 03/31/16 at 2:15 PM and with Dr. Grayland Ormond on 03/27/16 at 11:30 AM at the [facility] offices. The patient has been notified of the appointment information. Addendum by Jetta Lout, RRA on 03/24/16. Electronically Signed   By: Janice [name] M.D.   On: 03/24/2016 12:50   Result Date: 03/25/2016 CLINICAL DATA:  83 year old female for  stereotactic guided biopsy of indeterminate right breast calcifications EXAM: RIGHT BREAST STEREOTACTIC CORE NEEDLE BIOPSY COMPARISON:  Previous exams. FINDINGS: The patient and I discussed the procedure of stereotactic-guided biopsy including benefits and alternatives. We discussed the high likelihood of a successful procedure. We discussed the risks of the procedure including infection, bleeding, tissue injury, clip migration, and inadequate sampling. Informed written consent was given. The usual time out protocol was performed immediately prior to the procedure. Using sterile technique and 1% Lidocaine as local anesthetic, under stereotactic guidance, a 9 gauge vacuum assist device was used to perform core needle biopsy of calcifications in the upper, outer quadrant of the right breast  using a lateral to medial approach. Specimen radiograph was performed showing calcifications within the specimen. Specimens with calcifications are identified for pathology. At the conclusion of the procedure, a cylinder-shaped tissue marker clip was deployed into the biopsy cavity. Follow-up 2-view mammogram was performed and dictated separately. IMPRESSION: Stereotactic-guided biopsy of indeterminate right breast calcifications. No apparent complications. Electronically Signed: By: Janice [name] M.D. On: 03/18/2016 14:32    ASSESSMENT: Right breast DCIS, high grade.   PLAN:    1. Right breast DCIS, high grade: Patient will require lumpectomy likely followed by adjuvant XRT. She has appointment surgery in the next week. Patient will return to clinic one to 2 weeks after her surgery to discuss her final pathology results and additional treatment planning. If there is an invasive component on her final pathology, likely would not recommend chemotherapy given her advanced age. She will require tamoxifen for total of 5 years upon completion of her adjuvant XRT. 2.  History of B-cell lymphoma: Patient completed her treatments in March 2004. Her most recent PET scan completed in January 2012 did not reveal any evidence of recurrence. No intervention is needed at this time. 3.  History of Colon cancer: No evidence of disease. She had her resection in May 2007.  Patient's most recent colonoscopy in August 2011 was reported as negative.  Approximately 45 minutes was spent in discussion of which greater than 50% was consultation.   Patient expressed understanding and was in agreement with this plan. She also understands that She can call clinic at any time with any questions, concerns, or complaints.   Lloyd Huger, MD   03/29/2016 10:32 PM

## 2016-03-27 ENCOUNTER — Encounter: Payer: Self-pay | Admitting: Oncology

## 2016-03-27 ENCOUNTER — Inpatient Hospital Stay: Payer: Medicare Other | Attending: Oncology | Admitting: Oncology

## 2016-03-27 DIAGNOSIS — I10 Essential (primary) hypertension: Secondary | ICD-10-CM | POA: Diagnosis not present

## 2016-03-27 DIAGNOSIS — Z85828 Personal history of other malignant neoplasm of skin: Secondary | ICD-10-CM | POA: Diagnosis not present

## 2016-03-27 DIAGNOSIS — Z800 Family history of malignant neoplasm of digestive organs: Secondary | ICD-10-CM | POA: Diagnosis not present

## 2016-03-27 DIAGNOSIS — F419 Anxiety disorder, unspecified: Secondary | ICD-10-CM | POA: Insufficient documentation

## 2016-03-27 DIAGNOSIS — Z803 Family history of malignant neoplasm of breast: Secondary | ICD-10-CM | POA: Insufficient documentation

## 2016-03-27 DIAGNOSIS — D0511 Intraductal carcinoma in situ of right breast: Secondary | ICD-10-CM

## 2016-03-27 DIAGNOSIS — Z8572 Personal history of non-Hodgkin lymphomas: Secondary | ICD-10-CM | POA: Diagnosis not present

## 2016-03-27 DIAGNOSIS — Z79899 Other long term (current) drug therapy: Secondary | ICD-10-CM

## 2016-03-27 DIAGNOSIS — Z7982 Long term (current) use of aspirin: Secondary | ICD-10-CM | POA: Diagnosis not present

## 2016-03-27 DIAGNOSIS — E7800 Pure hypercholesterolemia, unspecified: Secondary | ICD-10-CM | POA: Insufficient documentation

## 2016-03-27 DIAGNOSIS — Z85038 Personal history of other malignant neoplasm of large intestine: Secondary | ICD-10-CM | POA: Insufficient documentation

## 2016-03-27 DIAGNOSIS — Z801 Family history of malignant neoplasm of trachea, bronchus and lung: Secondary | ICD-10-CM

## 2016-03-27 NOTE — Progress Notes (Signed)
Patient here for breast cancer (right) evaluation.

## 2016-03-31 ENCOUNTER — Telehealth: Payer: Self-pay | Admitting: Surgery

## 2016-03-31 ENCOUNTER — Ambulatory Visit (INDEPENDENT_AMBULATORY_CARE_PROVIDER_SITE_OTHER): Payer: Medicare Other | Admitting: Surgery

## 2016-03-31 ENCOUNTER — Encounter: Payer: Self-pay | Admitting: Surgery

## 2016-03-31 VITALS — BP 150/75 | HR 85 | Temp 97.7°F | Ht 60.0 in | Wt 152.0 lb

## 2016-03-31 DIAGNOSIS — N63 Unspecified lump in breast: Secondary | ICD-10-CM

## 2016-03-31 DIAGNOSIS — N6310 Unspecified lump in the right breast, unspecified quadrant: Secondary | ICD-10-CM

## 2016-03-31 NOTE — Telephone Encounter (Signed)
LVM for Janice [name] @ [place]  At this time to schedule Right breast US for patient.   When appointment is scheduled Dr.Reilly Molchan would like to speak specifically to the Radiologist that will be doing the Ultrasound.

## 2016-03-31 NOTE — Telephone Encounter (Signed)
Called patient at this time to let her know of her appointment times. Ultrasound appt-04/06/16 @ 9:20 an [place] F/U Dr.Pabon- 04/13/16 @ 3:00 pm [place]   Patient notified of appointment times above.

## 2016-03-31 NOTE — Patient Instructions (Signed)
I will call you later today or tomorrow with an Ultrasound appointment. Please call our office if you have questions or concerns.

## 2016-04-01 NOTE — Progress Notes (Signed)
Patient ID: Janice [name], female   DOB: 01-Feb-1929, 82 y.o.   MRN: [id number]  HPI Janice [name] is a 82 y.o. female seen in consultation for breast calcifications obtained on routine mammogram. She underwent spot compression showing evidence of clusters of calcification in the right upper outer quadrant. Biopsy confirmed high-grade DCIS with, or necrosis. I have personally seen and the mammograms and there is a cluster of calcifications. I did see that the clip is where the calcifications where. An I also see that her breast tissue spray dense. Of note she did have a history of colon cancer status post colectomy did not require any chemotherapy. She did have B-cell lymphoma that was treated with chemotherapy and now is on remission. She denies any breast mass or any breast discharge. She only complains of mild breast pain after the biopsy but not before this. She is 72 but is very functional and is independent and lives with her son and is able to walk only with assistance of a cane. She is able to drive on her own. No dyspnea or angina  HPI  Past Medical History:  Diagnosis Date  . Colon cancer ([place]) 2003   adenocarcinoma of the ascending colon  . Environmental allergies   . High cholesterol   . Hypertension   . Large cell lymphoma ([place]) 2007   Diffuse large cell lymphoma, completed CHOP and Rituxan in March 2004  . Skin cancer     Past Surgical History:  Procedure Laterality Date  . BREAST BIOPSY Right 03/18/2016   path pending  . COLON SURGERY  11/24/05   Cancer surgery  . COLONOSCOPY     2012  . COLONOSCOPY N/A 08/28/2015   Procedure: COLONOSCOPY;  Surgeon: Hulen Luster, MD;  Location: [facility];  Service: Gastroenterology;  Laterality: N/A;  . DIAGNOSTIC MAMMOGRAM     May 2014  . LYMPH NODE DISSECTION  2003   neck - non-hodgkin's lymphoma    Family History  Problem Relation Age of Onset  . Breast cancer Sister 88    half sister  . Cancer Other     family history of  colon cancer, breast cancer, lung cancer  . Cancer Paternal Uncle     colon    Social History Social History  Substance Use Topics  . Smoking status: Never Smoker  . Smokeless tobacco: Never Used  . Alcohol use No    No Known Allergies  Current Outpatient Prescriptions  Medication Sig Dispense Refill  . amLODipine (NORVASC) 2.5 MG tablet Take 2.5 mg by mouth daily.    Marland Kitchen aspirin 81 MG tablet Take 81 mg by mouth daily.    Marland Kitchen atorvastatin (LIPITOR) 10 MG tablet Take 10 mg by mouth daily.    Marland Kitchen lisinopril (PRINIVIL,ZESTRIL) 20 MG tablet Take 20 mg by mouth daily.     No current facility-administered medications for this visit.      Review of Systems A 10 point review of systems was asked and was negative except for the information on the HPI  Physical Exam Blood pressure (!) 150/75, pulse 85, temperature 97.7 F (36.5 C), temperature source Oral, height 5' (1.524 m), weight 68.9 kg (152 lb). CONSTITUTIONAL: NAD, walks w a cane at a resonable pace. EYES: Pupils are equal, round, and reactive to light, Sclera are non-icteric. EARS, NOSE, MOUTH AND THROAT: The oropharynx is clear. The oral mucosa is pink and moist. Hearing is intact to voice. LYMPH NODES:  Lymph nodes in the neck  are normal. RESPIRATORY:  Lungs are clear. There is normal respiratory effort, with equal breath sounds bilaterally, and without pathologic use of accessory muscles. CARDIOVASCULAR: Heart is regular without murmurs, gallops, or rubs. BREAST: Large and pedunculated breast. And there is a previous ecchymosis from the biopsy site and in the right upper outer quadrant there is a heart mass measuring approximately 1.5 x 3.5 cm. This is mobile and and is somehow ill-defined. GI: The abdomen is soft, nontender, and nondistended. There are no palpable masses. There is no hepatosplenomegaly. There are normal bowel sounds in all quadrants. GU: Rectal deferred.   MUSCULOSKELETAL: Normal muscle strength and tone. No  cyanosis or edema.   SKIN: Turgor is good and there are no pathologic skin lesions or ulcers. NEUROLOGIC: Motor and sensation is grossly normal. Cranial nerves are grossly intact. PSYCH:  Oriented to person, place and time. Affect is normal.  Data Reviewed I have personally reviewed the patient's imaging, laboratory findings and medical records.    Assessment Plan 82 year old female with recently diagnosed DCIS by biopsy of the calcifications. I do however feel an mass on my physical exam that may or may not correspond to the calcifications area. I given the discrepancy on the physical exam and mammography I'll like to obtain at targeted ultrasound on the right upper outer quadrant and discussed with our breast radiologist to see if what I'm feeling is part of the calcifications or its a completely new mass. She may need additional work In the form of an MRI. As a stated before there is some discrepancy between my physical exam which I do not believe this is a hematoma a do believe this is a legitimate breast tissue mass that may warrant additional workup before committing to a definitive surgical plan. I have discussed with the patient in detail and also discussed with the son about my concerns. I have informed very meticulous with my physical exam and also with the review of the imaging studies. One of the reasons  Why the mammogram may have missed the palpable mass could have been because of the density of the breast tissue and how pediculated and large her breasts are. After obtaining further images studies we will develop a definitive surgical plan. She is currently interested in breast preserving surgery, which I think might be a reasonable option if in fact the calc correlate closely with the palpable mass.  Extensive counseling provided.  Caroleen Hamman, MD FACS General Surgeon 04/01/2016, 10:18 AM

## 2016-04-03 NOTE — Progress Notes (Signed)
  Oncology Nurse Navigator Documentation  Navigator Location: [facility]c (04/03/16 1500)   Telephone: Outgoing Call (04/03/16 1500)                                        Time Spent with Patient: 15 (04/03/16 1500)   Phoned patient to follow-up on consults with Med Onc and Surgery.  States she is coming for ultrasound on 04/06/16, and will follow with Dr. Dahlia Byes on 04/13/16.  Reminded to call with any needs.

## 2016-04-06 ENCOUNTER — Other Ambulatory Visit: Payer: Self-pay | Admitting: Surgery

## 2016-04-06 ENCOUNTER — Telehealth: Payer: Self-pay

## 2016-04-06 ENCOUNTER — Ambulatory Visit
Admission: RE | Admit: 2016-04-06 | Discharge: 2016-04-06 | Disposition: A | Discharge status: Home / Self Care | Payer: Medicare Other | Source: Ambulatory Visit | Attending: Surgery | Admitting: Surgery

## 2016-04-06 DIAGNOSIS — N63 Unspecified lump in breast: Secondary | ICD-10-CM | POA: Diagnosis present

## 2016-04-06 DIAGNOSIS — I808 Phlebitis and thrombophlebitis of other sites: Secondary | ICD-10-CM | POA: Insufficient documentation

## 2016-04-06 DIAGNOSIS — L7632 Postprocedural hematoma of skin and subcutaneous tissue following other procedure: Secondary | ICD-10-CM | POA: Insufficient documentation

## 2016-04-06 DIAGNOSIS — D0511 Intraductal carcinoma in situ of right breast: Secondary | ICD-10-CM | POA: Diagnosis not present

## 2016-04-06 DIAGNOSIS — N6310 Unspecified lump in the right breast, unspecified quadrant: Secondary | ICD-10-CM

## 2016-04-06 DIAGNOSIS — C50411 Malignant neoplasm of upper-outer quadrant of right female breast: Secondary | ICD-10-CM

## 2016-04-06 DIAGNOSIS — C50412 Malignant neoplasm of upper-outer quadrant of left female breast: Principal | ICD-10-CM

## 2016-04-06 NOTE — Telephone Encounter (Signed)
I spoke with patient today to let her know that Dr,Pabon would like for her to have a MRI Bilateral Breast. Patient is  ok with this. I let the patient know that I would get this scheduled tomorrow and call her back. We also discussed a possibility of moving her follow up appointment with Dr.Pabon out further if I can not get the MRI scheduled prior to her appointment on 04/13/16.

## 2016-04-07 NOTE — Progress Notes (Signed)
  Oncology Nurse Navigator Documentation  Navigator Location: [facility] (04/07/16 0900)   Telephone: [name] Call;Incoming Call;Appt Confirmation/Clarification (04/07/16 0900)         Patient Visit Type: Follow-up (04/07/16 0900)                              Time Spent with Patient: 30 (04/07/16 0900)  Patient and son Vicente Serene requested information on follow-up appointments.  Needed directions for MRI on 04/16/16 at 3:00 at [facility].  Spoke to MRI staff, and directions to [facility] given to patient.

## 2016-04-07 NOTE — Telephone Encounter (Signed)
Spoke with patient at this time. Mailed appointment reminders today.  1. MRI 04/16/16 @ 2:45 pm 2. F/U Dr.Pabon 04/22/16 @ 11:00 am [place]  Patient verbalized understanding.

## 2016-04-10 ENCOUNTER — Other Ambulatory Visit (HOSPITAL_COMMUNITY): Payer: Medicare Other

## 2016-04-13 ENCOUNTER — Ambulatory Visit: Payer: Self-pay | Admitting: Surgery

## 2016-04-16 ENCOUNTER — Ambulatory Visit (HOSPITAL_COMMUNITY)
Admission: RE | Admit: 2016-04-16 | Discharge: 2016-04-16 | Disposition: A | Discharge status: Home / Self Care | Payer: Medicare Other | Source: Ambulatory Visit | Attending: Surgery | Admitting: Surgery

## 2016-04-16 DIAGNOSIS — C50411 Malignant neoplasm of upper-outer quadrant of right female breast: Secondary | ICD-10-CM | POA: Diagnosis not present

## 2016-04-16 DIAGNOSIS — C50412 Malignant neoplasm of upper-outer quadrant of left female breast: Secondary | ICD-10-CM | POA: Diagnosis not present

## 2016-04-16 LAB — POCT I-STAT CREATININE: CREATININE: 1 mg/dL (ref 0.44–1.00)

## 2016-04-16 MED ORDER — GADOBENATE DIMEGLUMINE 529 MG/ML IV SOLN
14.0000 mL | Freq: Once | INTRAVENOUS | Status: AC | PRN
  Administered 2016-04-16: 14 mL via INTRAVENOUS

## 2016-04-17 ENCOUNTER — Other Ambulatory Visit: Payer: Self-pay

## 2016-04-17 DIAGNOSIS — N6310 Unspecified lump in the right breast, unspecified quadrant: Secondary | ICD-10-CM

## 2016-04-17 NOTE — Telephone Encounter (Signed)
Called pt about MRI results, these were reviewed personally. Evidence of mass on the right side likely hematoma after the biopsy, No evidence of second malignancy. We will plan on needle loc of the right breast with SLNBx. Procedure d/w the pt in detail and she wishes to proceed. D/W son in detail as well. We will plan for surgery next week

## 2016-04-20 ENCOUNTER — Telehealth: Payer: Self-pay | Admitting: Surgery

## 2016-04-20 NOTE — Telephone Encounter (Signed)
Pt advised of pre op date/time and sx date. Sx: 04/24/16 with Dr Othelia Pulling lumpectomy with NL an SN.  Pre op: 04/21/16 @ 1:45pm--Office.   Patient made aware to arrive at [facility] at 8:00am the day of surgery.   Patient understands all directions.

## 2016-04-21 ENCOUNTER — Encounter
Admission: RE | Admit: 2016-04-21 | Discharge: 2016-04-21 | Disposition: A | Discharge status: Home / Self Care | Payer: Medicare Other | Source: Ambulatory Visit | Attending: Surgery | Admitting: Surgery

## 2016-04-21 DIAGNOSIS — Z85828 Personal history of other malignant neoplasm of skin: Secondary | ICD-10-CM | POA: Diagnosis not present

## 2016-04-21 DIAGNOSIS — Z7982 Long term (current) use of aspirin: Secondary | ICD-10-CM | POA: Diagnosis not present

## 2016-04-21 DIAGNOSIS — Z9221 Personal history of antineoplastic chemotherapy: Secondary | ICD-10-CM | POA: Diagnosis not present

## 2016-04-21 DIAGNOSIS — Z803 Family history of malignant neoplasm of breast: Secondary | ICD-10-CM | POA: Diagnosis not present

## 2016-04-21 DIAGNOSIS — N644 Mastodynia: Secondary | ICD-10-CM | POA: Diagnosis not present

## 2016-04-21 DIAGNOSIS — Z85038 Personal history of other malignant neoplasm of large intestine: Secondary | ICD-10-CM | POA: Diagnosis not present

## 2016-04-21 DIAGNOSIS — D0511 Intraductal carcinoma in situ of right breast: Secondary | ICD-10-CM | POA: Diagnosis not present

## 2016-04-21 DIAGNOSIS — E7800 Pure hypercholesterolemia, unspecified: Secondary | ICD-10-CM | POA: Diagnosis not present

## 2016-04-21 DIAGNOSIS — Z801 Family history of malignant neoplasm of trachea, bronchus and lung: Secondary | ICD-10-CM | POA: Diagnosis not present

## 2016-04-21 DIAGNOSIS — I10 Essential (primary) hypertension: Secondary | ICD-10-CM | POA: Diagnosis not present

## 2016-04-21 DIAGNOSIS — Z800 Family history of malignant neoplasm of digestive organs: Secondary | ICD-10-CM | POA: Diagnosis not present

## 2016-04-21 DIAGNOSIS — Z8572 Personal history of non-Hodgkin lymphomas: Secondary | ICD-10-CM | POA: Diagnosis not present

## 2016-04-21 LAB — CBC WITH DIFFERENTIAL/PLATELET
BASOS ABS: 0 10*3/uL (ref 0–0.1)
BASOS PCT: 1 %
EOS ABS: 0.3 10*3/uL (ref 0–0.7)
EOS PCT: 6 %
HCT: 42.5 % (ref 35.0–47.0)
Hemoglobin: 14 g/dL (ref 12.0–16.0)
Lymphocytes Relative: 26 %
Lymphs Abs: 1.3 10*3/uL (ref 1.0–3.6)
MCH: 31.2 pg (ref 26.0–34.0)
MCHC: 32.9 g/dL (ref 32.0–36.0)
MCV: 94.9 fL (ref 80.0–100.0)
Monocytes Absolute: 0.4 10*3/uL (ref 0.2–0.9)
Monocytes Relative: 8 %
Neutro Abs: 2.9 10*3/uL (ref 1.4–6.5)
Neutrophils Relative %: 59 %
PLATELETS: 209 10*3/uL (ref 150–440)
RBC: 4.48 MIL/uL (ref 3.80–5.20)
RDW: 14.1 % (ref 11.5–14.5)
WBC: 4.8 10*3/uL (ref 3.6–11.0)

## 2016-04-21 LAB — COMPREHENSIVE METABOLIC PANEL
ALK PHOS: 60 U/L (ref 38–126)
ALT: 19 U/L (ref 14–54)
AST: 20 U/L (ref 15–41)
Albumin: 4 g/dL (ref 3.5–5.0)
Anion gap: 7 (ref 5–15)
BUN: 16 mg/dL (ref 6–20)
CALCIUM: 9.7 mg/dL (ref 8.9–10.3)
CHLORIDE: 102 mmol/L (ref 101–111)
CO2: 30 mmol/L (ref 22–32)
CREATININE: 0.83 mg/dL (ref 0.44–1.00)
GFR calc Af Amer: >60 mL/min (ref >60)
Glucose, Bld: 107 mg/dL — ABNORMAL HIGH (ref 65–99)
Potassium: 4 mmol/L (ref 3.5–5.1)
SODIUM: 139 mmol/L (ref 135–145)
Total Bilirubin: 0.4 mg/dL (ref 0.3–1.2)
Total Protein: 7.5 g/dL (ref 6.5–8.1)

## 2016-04-21 LAB — PROTIME-INR
INR: 0.92
Prothrombin Time: 12.3 seconds (ref 11.4–15.2)

## 2016-04-21 LAB — APTT: APTT: 28 s (ref 24–36)

## 2016-04-21 NOTE — Patient Instructions (Addendum)
Your procedure is scheduled on: Friday 04/24/16 Report to Mammography AT 8:00 AM.  [phone] DAY SURGERY NUMBER Remember: Instructions that are not followed completely may result in serious medical risk, up to and including death, or upon the discretion of your surgeon and anesthesiologist your surgery may need to be rescheduled.    __X__ 1. Do not eat food or drink liquids after midnight. No gum chewing or hard candies.     __X__ 2. No Alcohol for 24 hours before or after surgery.   ____ 3. Bring all medications with you on the day of surgery if instructed.    __X__ 4. Notify your doctor if there is any change in your medical condition     (cold, fever, infections).     Do not wear jewelry, make-up, hairpins, clips or nail polish.  Do not wear lotions, powders, or perfumes.   Do not shave 48 hours prior to surgery. Men may shave face and neck.  Do not bring valuables to the hospital.    [facility] is not responsible for any belongings or valuables.               Contacts, dentures or bridgework may not be worn into surgery.  Leave your suitcase in the car. After surgery it may be brought to your room.  For patients admitted to the hospital, discharge time is determined by your                treatment team.   Patients discharged the day of surgery will not be allowed to drive home.   Please read over the following fact sheets that you were given:   MRSA Information   __X__ Take these medicines the morning of surgery with A SIP OF WATER:    1. AMLODIPINE  2. ATORVASTATIN  3. LISINOPRIL  4.  5.  6.  ____ Fleet Enema (as directed)   __X__ Use CHG Soap as directed  ____ Use inhalers on the day of surgery  ____ Stop metformin 2 days prior to surgery    ____ Take 1/2 of usual insulin dose the night before surgery and none on the morning of surgery.   ____ Stop Coumadin/Plavix/aspirin on ALREADY STOPPED ASPIRIN  ____ Stop Anti-inflammatories on    ____ Stop  supplements until after surgery.    ____ Bring C-Pap to the hospital.

## 2016-04-22 ENCOUNTER — Ambulatory Visit: Payer: Self-pay | Admitting: Surgery

## 2016-04-22 NOTE — Pre-Procedure Instructions (Signed)
MEDICAL CLEARANCE REQUEST/EKG,AS INSTRUCTED BY DR Amie Critchley CALLED AND FAXED TO [place] AT DR Claudette Laws. ALSO CALLED AND FAXED TO Janace Hoard AT DR PABON'S. PATIENT ALSO NOTIFIED.

## 2016-04-23 MED ORDER — CEFAZOLIN SODIUM-DEXTROSE 2-4 GM/100ML-% IV SOLN
2.0000 g | INTRAVENOUS | Status: AC
  Administered 2016-04-24: 2 g via INTRAVENOUS

## 2016-04-23 NOTE — Pre-Procedure Instructions (Signed)
Medical clearance received from Dr. Kandice Robinsons and placed on the chart; patient is a moderate surgical risk.

## 2016-04-24 ENCOUNTER — Ambulatory Visit
Admission: RE | Admit: 2016-04-24 | Discharge: 2016-04-24 | Disposition: A | Discharge status: Home / Self Care | Payer: Medicare Other | Source: Ambulatory Visit | Attending: Surgery | Admitting: Surgery

## 2016-04-24 ENCOUNTER — Encounter: Payer: Self-pay | Admitting: *Deleted

## 2016-04-24 ENCOUNTER — Encounter
Admission: RE | Admit: 2016-04-24 | Discharge: 2016-04-24 | Disposition: A | Discharge status: Home / Self Care | Payer: Medicare Other | Source: Ambulatory Visit | Attending: Surgery | Admitting: Surgery

## 2016-04-24 ENCOUNTER — Encounter
Admission: RE | Disposition: A | Discharge status: Home / Self Care | Payer: Self-pay | Source: Ambulatory Visit | Attending: Surgery

## 2016-04-24 ENCOUNTER — Ambulatory Visit: Payer: Medicare Other | Admitting: Anesthesiology

## 2016-04-24 DIAGNOSIS — Z85828 Personal history of other malignant neoplasm of skin: Secondary | ICD-10-CM | POA: Insufficient documentation

## 2016-04-24 DIAGNOSIS — Z803 Family history of malignant neoplasm of breast: Secondary | ICD-10-CM | POA: Insufficient documentation

## 2016-04-24 DIAGNOSIS — Z8572 Personal history of non-Hodgkin lymphomas: Secondary | ICD-10-CM | POA: Insufficient documentation

## 2016-04-24 DIAGNOSIS — N6310 Unspecified lump in the right breast, unspecified quadrant: Secondary | ICD-10-CM

## 2016-04-24 DIAGNOSIS — Z7982 Long term (current) use of aspirin: Secondary | ICD-10-CM | POA: Insufficient documentation

## 2016-04-24 DIAGNOSIS — Z85038 Personal history of other malignant neoplasm of large intestine: Secondary | ICD-10-CM | POA: Insufficient documentation

## 2016-04-24 DIAGNOSIS — D0511 Intraductal carcinoma in situ of right breast: Secondary | ICD-10-CM | POA: Diagnosis not present

## 2016-04-24 DIAGNOSIS — N644 Mastodynia: Secondary | ICD-10-CM | POA: Insufficient documentation

## 2016-04-24 DIAGNOSIS — Z801 Family history of malignant neoplasm of trachea, bronchus and lung: Secondary | ICD-10-CM | POA: Insufficient documentation

## 2016-04-24 DIAGNOSIS — E7800 Pure hypercholesterolemia, unspecified: Secondary | ICD-10-CM | POA: Insufficient documentation

## 2016-04-24 DIAGNOSIS — I10 Essential (primary) hypertension: Secondary | ICD-10-CM | POA: Insufficient documentation

## 2016-04-24 DIAGNOSIS — Z9221 Personal history of antineoplastic chemotherapy: Secondary | ICD-10-CM | POA: Insufficient documentation

## 2016-04-24 DIAGNOSIS — Z800 Family history of malignant neoplasm of digestive organs: Secondary | ICD-10-CM | POA: Insufficient documentation

## 2016-04-24 HISTORY — PX: SENTINEL NODE BIOPSY: SHX6608

## 2016-04-24 HISTORY — PX: BREAST LUMPECTOMY: SHX2

## 2016-04-24 HISTORY — PX: BREAST LUMPECTOMY WITH NEEDLE LOCALIZATION: SHX5759

## 2016-04-24 SURGERY — BREAST LUMPECTOMY WITH NEEDLE LOCALIZATION
Anesthesia: General | Laterality: Right

## 2016-04-24 MED ORDER — CHLORHEXIDINE GLUCONATE CLOTH 2 % EX PADS: 6.0000 | MEDICATED_PAD | Freq: Once | CUTANEOUS | Status: DC

## 2016-04-24 MED ORDER — LIDOCAINE-EPINEPHRINE 1 %-1:100000 IJ SOLN: INTRAMUSCULAR | Status: DC | PRN

## 2016-04-24 MED ORDER — OXYCODONE HCL 5 MG PO TABS
ORAL_TABLET | ORAL | Status: AC
  Filled 2016-04-24: qty 1

## 2016-04-24 MED ORDER — FENTANYL CITRATE (PF) 100 MCG/2ML IJ SOLN
INTRAMUSCULAR | Status: DC | PRN
  Administered 2016-04-24 (×8): 25 ug via INTRAVENOUS

## 2016-04-24 MED ORDER — ISOSULFAN BLUE 1 % ~~LOC~~ SOLN
SUBCUTANEOUS | Status: AC
  Filled 2016-04-24: qty 5

## 2016-04-24 MED ORDER — PHENYLEPHRINE HCL 10 MG/ML IJ SOLN
INTRAMUSCULAR | Status: DC | PRN
  Administered 2016-04-24 (×2): 100 ug via INTRAVENOUS
  Administered 2016-04-24: 50 ug via INTRAVENOUS
  Administered 2016-04-24: 100 ug via INTRAVENOUS

## 2016-04-24 MED ORDER — OXYCODONE HCL 5 MG/5ML PO SOLN: 5.0000 mg | Freq: Once | ORAL | Status: AC | PRN

## 2016-04-24 MED ORDER — PROPOFOL 10 MG/ML IV BOLUS
INTRAVENOUS | Status: DC | PRN
  Administered 2016-04-24: 30 mg via INTRAVENOUS
  Administered 2016-04-24: 80 mg via INTRAVENOUS
  Administered 2016-04-24: 40 mg via INTRAVENOUS

## 2016-04-24 MED ORDER — ISOSULFAN BLUE 1 % ~~LOC~~ SOLN
SUBCUTANEOUS | Status: DC | PRN
  Administered 2016-04-24: 5 mg via SUBCUTANEOUS

## 2016-04-24 MED ORDER — BUPIVACAINE-EPINEPHRINE (PF) 0.25% -1:200000 IJ SOLN
INTRAMUSCULAR | Status: AC
  Filled 2016-04-24: qty 30

## 2016-04-24 MED ORDER — MEPERIDINE HCL 25 MG/ML IJ SOLN: 6.2500 mg | INTRAMUSCULAR | Status: DC | PRN

## 2016-04-24 MED ORDER — LIDOCAINE-EPINEPHRINE 1 %-1:100000 IJ SOLN
INTRAMUSCULAR | Status: AC
  Filled 2016-04-24: qty 1

## 2016-04-24 MED ORDER — TECHNETIUM TC 99M SULFUR COLLOID
1.0840 | Freq: Once | INTRAVENOUS | Status: AC | PRN
  Administered 2016-04-24: 1.084 via INTRAVENOUS

## 2016-04-24 MED ORDER — ACETAMINOPHEN 500 MG PO TABS
ORAL_TABLET | ORAL | Status: AC
  Filled 2016-04-24: qty 2

## 2016-04-24 MED ORDER — CEFAZOLIN SODIUM-DEXTROSE 2-4 GM/100ML-% IV SOLN
INTRAVENOUS | Status: AC
  Filled 2016-04-24: qty 100

## 2016-04-24 MED ORDER — LACTATED RINGERS IV SOLN
INTRAVENOUS | Status: DC
  Administered 2016-04-24: 16:00:00 via INTRAVENOUS
  Administered 2016-04-24: 75 mL/h via INTRAVENOUS

## 2016-04-24 MED ORDER — FAMOTIDINE 20 MG PO TABS
20.0000 mg | ORAL_TABLET | Freq: Once | ORAL | Status: AC
  Administered 2016-04-24: 20 mg via ORAL

## 2016-04-24 MED ORDER — BUPIVACAINE-EPINEPHRINE (PF) 0.5% -1:200000 IJ SOLN
INTRAMUSCULAR | Status: AC
Start: 2016-04-24 — End: 2016-04-24
  Filled 2016-04-24: qty 30

## 2016-04-24 MED ORDER — DEXAMETHASONE SODIUM PHOSPHATE 10 MG/ML IJ SOLN
INTRAMUSCULAR | Status: DC | PRN
  Administered 2016-04-24: 5 mg via INTRAVENOUS

## 2016-04-24 MED ORDER — FENTANYL CITRATE (PF) 100 MCG/2ML IJ SOLN
25.0000 ug | INTRAMUSCULAR | Status: DC | PRN
  Administered 2016-04-24 (×2): 25 ug via INTRAVENOUS

## 2016-04-24 MED ORDER — OXYCODONE HCL 5 MG PO TABS
5.0000 mg | ORAL_TABLET | Freq: Once | ORAL | Status: AC | PRN
  Administered 2016-04-24: 5 mg via ORAL

## 2016-04-24 MED ORDER — ACETAMINOPHEN 10 MG/ML IV SOLN
INTRAVENOUS | Status: AC
  Filled 2016-04-24: qty 100

## 2016-04-24 MED ORDER — BUPIVACAINE-EPINEPHRINE 0.25% -1:200000 IJ SOLN
INTRAMUSCULAR | Status: DC | PRN
  Administered 2016-04-24: 30 mL

## 2016-04-24 MED ORDER — LIDOCAINE HCL (CARDIAC) 20 MG/ML IV SOLN
INTRAVENOUS | Status: DC | PRN
  Administered 2016-04-24: 60 mg via INTRAVENOUS

## 2016-04-24 MED ORDER — ONDANSETRON HCL 4 MG/2ML IJ SOLN
INTRAMUSCULAR | Status: DC | PRN
  Administered 2016-04-24: 4 mg via INTRAVENOUS

## 2016-04-24 MED ORDER — FENTANYL CITRATE (PF) 100 MCG/2ML IJ SOLN
INTRAMUSCULAR | Status: AC
  Administered 2016-04-24: 25 ug via INTRAVENOUS
  Filled 2016-04-24: qty 2

## 2016-04-24 MED ORDER — ACETAMINOPHEN 10 MG/ML IV SOLN
INTRAVENOUS | Status: DC | PRN
  Administered 2016-04-24: 1000 mg via INTRAVENOUS

## 2016-04-24 MED ORDER — FAMOTIDINE 20 MG PO TABS
ORAL_TABLET | ORAL | Status: AC
  Administered 2016-04-24: 20 mg via ORAL
  Filled 2016-04-24: qty 1

## 2016-04-24 MED ORDER — PROMETHAZINE HCL 25 MG/ML IJ SOLN: 6.2500 mg | INTRAMUSCULAR | Status: DC | PRN

## 2016-04-24 MED ORDER — EPHEDRINE SULFATE 50 MG/ML IJ SOLN
INTRAMUSCULAR | Status: DC | PRN
  Administered 2016-04-24: 10 mg via INTRAVENOUS

## 2016-04-24 MED ORDER — HYDROCODONE-ACETAMINOPHEN 5-325 MG PO TABS: 1.0000 | ORAL_TABLET | ORAL | 0 refills | Status: DC | PRN

## 2016-04-24 SURGICAL SUPPLY — 24 items
APPLIER CLIP 9.375 SM OPEN (CLIP) ×6
BULB RESERV EVAC DRAIN JP 100C (MISCELLANEOUS) ×3 IMPLANT
CANISTER SUCT 1200ML W/VALVE (MISCELLANEOUS) ×3 IMPLANT
CHLORAPREP W/TINT 26ML (MISCELLANEOUS) ×3 IMPLANT
CLIP APPLIE 9.375 SM OPEN (CLIP) ×2 IMPLANT
DRAIN CHANNEL JP 15F RND 16 (MISCELLANEOUS) ×3 IMPLANT
DRAPE LAPAROTOMY 100X77 ABD (DRAPES) ×3 IMPLANT
ELECT CAUTERY BLADE 6.4 (BLADE) ×3 IMPLANT
ELECT REM PT RETURN 9FT ADLT (ELECTROSURGICAL) ×3
ELECTRODE REM PT RTRN 9FT ADLT (ELECTROSURGICAL) ×1 IMPLANT
GLOVE BIO SURGEON STRL SZ7 (GLOVE) ×12 IMPLANT
GOWN STRL REUS W/ TWL LRG LVL3 (GOWN DISPOSABLE) ×3 IMPLANT
GOWN STRL REUS W/TWL LRG LVL3 (GOWN DISPOSABLE) ×6
KIT RM TURNOVER STRD PROC AR (KITS) ×3 IMPLANT
LIQUID BAND (GAUZE/BANDAGES/DRESSINGS) ×3 IMPLANT
NEEDLE HYPO 22GX1.5 SAFETY (NEEDLE) ×3 IMPLANT
PACK BASIN MINOR ARMC (MISCELLANEOUS) ×3 IMPLANT
SLEVE PROBE SENORX GAMMA FIND (MISCELLANEOUS) ×3 IMPLANT
SPONGE DRAIN TRACH 4X4 STRL 2S (GAUZE/BANDAGES/DRESSINGS) ×3 IMPLANT
SPONGE LAP 18X18 5 PK (GAUZE/BANDAGES/DRESSINGS) ×3 IMPLANT
SUT SILK 2 0 SH (SUTURE) ×3 IMPLANT
SUT VIC AB 2-0 CT2 27 (SUTURE) ×3 IMPLANT
SYR 10ML SLIP (SYRINGE) ×3 IMPLANT
WATER STERILE IRR 1000ML POUR (IV SOLUTION) ×3 IMPLANT

## 2016-04-24 NOTE — Transfer of Care (Signed)
Immediate Anesthesia Transfer of Care Note  Patient: Janice [name]  Procedure(s) Performed: Procedure(s): BREAST LUMPECTOMY WITH NEEDLE LOCALIZATION (Right) SENTINEL NODE BIOPSY (Right)  Patient Location: PACU  Anesthesia Type:General  Level of Consciousness: sedated  Airway & Oxygen Therapy: Patient Spontanous Breathing and Patient connected to face mask oxygen  Post-op Assessment: Report given to RN and Post -op Vital signs reviewed and stable  Post vital signs: Reviewed and stable  Last Vitals:  Vitals:   04/24/16 0948 04/24/16 1556  BP: (!) 175/75 134/64  Pulse: 91 82  Resp: 17 14  Temp: 36.5 C 36.7 C    Last Pain:  Vitals:   04/24/16 0948  TempSrc: Oral         Complications: No apparent anesthesia complications

## 2016-04-24 NOTE — Anesthesia Preprocedure Evaluation (Signed)
Anesthesia Evaluation  Patient identified by MRN, date of birth, ID band Patient awake    Reviewed: Allergy & Precautions, NPO status , Patient's Chart, lab work & pertinent test results  History of Anesthesia Complications Negative for: history of anesthetic complications  Airway Mallampati: III  TM Distance: >3 FB Neck ROM: Full    Dental no notable dental hx.    Pulmonary neg pulmonary ROS, neg sleep apnea, neg COPD,    breath sounds clear to auscultation- rhonchi (-) wheezing      Cardiovascular Exercise Tolerance: Good hypertension, Pt. on medications (-) CAD and (-) Past MI  Rhythm:Regular Rate:Normal - Systolic murmurs and - Diastolic murmurs    Neuro/Psych negative neurological ROS  negative psych ROS   GI/Hepatic negative GI ROS, Neg liver ROS,   Endo/Other  negative endocrine ROSneg diabetes  Renal/GU negative Renal ROS     Musculoskeletal negative musculoskeletal ROS (+)   Abdominal (+) - obese,   Peds  Hematology negative hematology ROS (+)   Anesthesia Other Findings Past Medical History: 2003: Colon cancer ([place])     Comment: adenocarcinoma of the ascending colon No date: Environmental allergies No date: High cholesterol No date: Hypertension 2007: Large cell lymphoma ([place])     Comment: Diffuse large cell lymphoma, completed CHOP               and Rituxan in March 2004 No date: Skin cancer   Reproductive/Obstetrics                             Anesthesia Physical Anesthesia Plan  ASA: III  Anesthesia Plan: General   Post-op Pain Management:    Induction: Intravenous  Airway Management Planned: LMA  Additional Equipment:   Intra-op Plan:   Post-operative Plan:   Informed Consent: I have reviewed the patients History and Physical, chart, labs and discussed the procedure including the risks, benefits and alternatives for the proposed anesthesia with the  patient or authorized representative who has indicated his/her understanding and acceptance.   Dental advisory given  Plan Discussed with: CRNA and Anesthesiologist  Anesthesia Plan Comments:         Anesthesia Quick Evaluation

## 2016-04-24 NOTE — Anesthesia Postprocedure Evaluation (Signed)
Anesthesia Post Note  Patient: Tyrhianna Bonnie Kato  Procedure(s) Performed: Procedure(s) (LRB): BREAST LUMPECTOMY WITH NEEDLE LOCALIZATION (Right) SENTINEL NODE BIOPSY (Right)  Patient location during evaluation: PACU Anesthesia Type: General Level of consciousness: awake and alert Pain management: pain level controlled Vital Signs Assessment: post-procedure vital signs reviewed and stable Respiratory status: spontaneous breathing, nonlabored ventilation, respiratory function stable and patient connected to nasal cannula oxygen Cardiovascular status: blood pressure returned to baseline and stable Postop Assessment: no signs of nausea or vomiting Anesthetic complications: no    Last Vitals:  Vitals:   04/24/16 1640 04/24/16 1641  BP:  (!) 155/64  Pulse: 92 92  Resp: 19 (!) 26  Temp:  36.3 C    Last Pain:  Vitals:   04/24/16 1640  TempSrc:   PainSc: 5                  Precious Haws Aideliz Garmany

## 2016-04-24 NOTE — Discharge Instructions (Signed)

## 2016-04-24 NOTE — Progress Notes (Signed)
Went over instructions for JP drain with sons and gave copy of instructions   Stated understanding

## 2016-04-24 NOTE — Op Note (Signed)
  Pre-operative Diagnosis:  DCIS with comedo necrosis    Post-operative Diagnosis: Same   Surgeon: Caroleen Hamman, MD FACS  Anesthesia:GETA  Procedure: Right Lumpectomy needle directed and sentinel node biopsy   Findings: Clip and tip of the wire within specimen                  One True SLNBx Both blue and radioactive. Second Lymph node only blue  Estimated Blood Loss: 25cc         Drains: None         Specimens: partial mastectomy with labels, long lateral and short superior; sentinel node for frozen section       Complications: none                Condition: Stable   Procedure Details  The patient was seen again in the Holding Room. The benefits, complications, treatment options, and expected outcomes were discussed with the patient. The risks of bleeding, infection, recurrence of symptoms, failure to resolve symptoms, hematoma, seroma, open wound, cosmetic deformity, and the need for further surgery were discussed.  The patient was taken to Operating Room, identified as [place] and the procedure verified.  A Time Out was held and the above information confirmed.  Prior to the induction of general anesthesia, antibiotic prophylaxis was administered. VTE prophylaxis was in place. Appropriate anesthesia was then administered and tolerated well. The chest was prepped with Chloraprep and draped in the sterile fashion. The patient was positioned in the supine position.   Isosulfan blue dye was injected periareolar early under aseptic conditions. Then using the hand-held probe an area of high counts was identified in the axilla, an incision was made and direction by the probe aided in dissection of a lymph node which was sent for permanent pathology, there was a second node that was blue but has no activity, this was also sent for pathology.  Attention was turned to the needle localization site where an incision was made encompassing the needle insertion site and we were actually able  to use the previous axillary incision given the lateral location of the lesion. Dissection around the needle to perform a partial mastectomy with adequate margins was performed. This was done with electrocautery and sharp dissection. I performed generous margins and the dep margin was the pectoralis muscle.   Hemostasis was with electrocautery. Additional Marcaine was infiltrated into the skin and subcutaneous tissues of the cavity. Once assuring that hemostasis was adequate and checked multiple times the wound was closed with interrupted 2-0 Vicryl followed by 4-0 subcuticular Monocryl sutures. A 15 Fr. Blake drain was placed deep in axillary region and on top of the pectoralis muscle. Drain secure w nylon sutures.   Patient was taken to the recovery room in stable condition    Caroleen Hamman, MD, FACS

## 2016-04-24 NOTE — Interval H&P Note (Signed)
History and Physical Interval Note:  04/24/2016 11:17 AM  Janice [name]  has presented today for surgery, with the diagnosis of right breast mass  The various methods of treatment have been discussed with the patient and family. After consideration of risks, benefits and other options for treatment, the patient has consented to  Procedure(s): BREAST LUMPECTOMY WITH NEEDLE LOCALIZATION (Right) SENTINEL NODE BIOPSY (Right) as a surgical intervention .  The patient's history has been reviewed, patient examined, no change in status, stable for surgery.  I have reviewed the patient's chart and labs.  Questions were answered to the patient's satisfaction.     [place]

## 2016-04-24 NOTE — Anesthesia Procedure Notes (Signed)
Procedure Name: LMA Insertion Performed by: Dionne Bucy Pre-anesthesia Checklist: Patient identified, Patient being monitored, Timeout performed, Emergency Drugs available and Suction available Patient Re-evaluated:Patient Re-evaluated prior to inductionOxygen Delivery Method: Circle system utilized Preoxygenation: Pre-oxygenation with 100% oxygen Intubation Type: IV induction Ventilation: Mask ventilation without difficulty LMA: LMA inserted LMA Size: 4.0 Tube type: Oral Number of attempts: 1 Placement Confirmation: positive ETCO2 and breath sounds checked- equal and bilateral Tube secured with: Tape Dental Injury: Teeth and Oropharynx as per pre-operative assessment

## 2016-04-24 NOTE — H&P (View-Only) (Signed)
Patient ID: Janice [name], female   DOB: September 29, 1928, 82 y.o.   MRN: [id number]  HPI Janice [name] is a 82 y.o. female seen in consultation for breast calcifications obtained on routine mammogram. She underwent spot compression showing evidence of clusters of calcification in the right upper outer quadrant. Biopsy confirmed high-grade DCIS with, or necrosis. I have personally seen and the mammograms and there is a cluster of calcifications. I did see that the clip is where the calcifications where. An I also see that her breast tissue spray dense. Of note she did have a history of colon cancer status post colectomy did not require any chemotherapy. She did have B-cell lymphoma that was treated with chemotherapy and now is on remission. She denies any breast mass or any breast discharge. She only complains of mild breast pain after the biopsy but not before this. She is 58 but is very functional and is independent and lives with her son and is able to walk only with assistance of a cane. She is able to drive on her own. No dyspnea or angina  HPI  Past Medical History:  Diagnosis Date  . Colon cancer ([place]) 2003   adenocarcinoma of the ascending colon  . Environmental allergies   . High cholesterol   . Hypertension   . Large cell lymphoma ([place]) 2007   Diffuse large cell lymphoma, completed [facility] and Rituxan in March 2004  . Skin cancer     Past Surgical History:  Procedure Laterality Date  . BREAST BIOPSY Right 03/18/2016   path pending  . COLON SURGERY  11/24/05   Cancer surgery  . COLONOSCOPY     2012  . COLONOSCOPY N/A 08/28/2015   Procedure: COLONOSCOPY;  Surgeon: Hulen Luster, MD;  Location: [facility];  Service: Gastroenterology;  Laterality: N/A;  . DIAGNOSTIC MAMMOGRAM     May 2014  . LYMPH NODE DISSECTION  2003   neck - non-hodgkin's lymphoma    Family History  Problem Relation Age of Onset  . Breast cancer Sister 45    half sister  . Cancer Other     family history of  colon cancer, breast cancer, lung cancer  . Cancer Paternal Uncle     colon    Social History Social History  Substance Use Topics  . Smoking status: Never Smoker  . Smokeless tobacco: Never Used  . Alcohol use No    No Known Allergies  Current Outpatient Prescriptions  Medication Sig Dispense Refill  . amLODipine (NORVASC) 2.5 MG tablet Take 2.5 mg by mouth daily.    Marland Kitchen aspirin 81 MG tablet Take 81 mg by mouth daily.    Marland Kitchen atorvastatin (LIPITOR) 10 MG tablet Take 10 mg by mouth daily.    Marland Kitchen lisinopril (PRINIVIL,ZESTRIL) 20 MG tablet Take 20 mg by mouth daily.     No current facility-administered medications for this visit.      Review of Systems A 10 point review of systems was asked and was negative except for the information on the HPI  Physical Exam Blood pressure (!) 150/75, pulse 85, temperature 97.7 F (36.5 C), temperature source Oral, height 5' (1.524 m), weight 68.9 kg (152 lb). CONSTITUTIONAL: NAD, walks w a cane at a resonable pace. EYES: Pupils are equal, round, and reactive to light, Sclera are non-icteric. EARS, NOSE, MOUTH AND THROAT: The oropharynx is clear. The oral mucosa is pink and moist. Hearing is intact to voice. LYMPH NODES:  Lymph nodes in the neck  are normal. RESPIRATORY:  Lungs are clear. There is normal respiratory effort, with equal breath sounds bilaterally, and without pathologic use of accessory muscles. CARDIOVASCULAR: Heart is regular without murmurs, gallops, or rubs. BREAST: Large and pedunculated breast. And there is a previous ecchymosis from the biopsy site and in the right upper outer quadrant there is a heart mass measuring approximately 1.5 x 3.5 cm. This is mobile and and is somehow ill-defined. GI: The abdomen is soft, nontender, and nondistended. There are no palpable masses. There is no hepatosplenomegaly. There are normal bowel sounds in all quadrants. GU: Rectal deferred.   MUSCULOSKELETAL: Normal muscle strength and tone. No  cyanosis or edema.   SKIN: Turgor is good and there are no pathologic skin lesions or ulcers. NEUROLOGIC: Motor and sensation is grossly normal. Cranial nerves are grossly intact. PSYCH:  Oriented to person, place and time. Affect is normal.  Data Reviewed I have personally reviewed the patient's imaging, laboratory findings and medical records.    Assessment Plan 82 year old female with recently diagnosed DCIS by biopsy of the calcifications. I do however feel an mass on my physical exam that may or may not correspond to the calcifications area. I given the discrepancy on the physical exam and mammography I'll like to obtain at targeted ultrasound on the right upper outer quadrant and discussed with our breast radiologist to see if what I'm feeling is part of the calcifications or its a completely new mass. She may need additional work In the form of an MRI. As a stated before there is some discrepancy between my physical exam which I do not believe this is a hematoma a do believe this is a legitimate breast tissue mass that may warrant additional workup before committing to a definitive surgical plan. I have discussed with the patient in detail and also discussed with the son about my concerns. I have informed very meticulous with my physical exam and also with the review of the imaging studies. One of the reasons  Why the mammogram may have missed the palpable mass could have been because of the density of the breast tissue and how pediculated and large her breasts are. After obtaining further images studies we will develop a definitive surgical plan. She is currently interested in breast preserving surgery, which I think might be a reasonable option if in fact the calc correlate closely with the palpable mass.  Extensive counseling provided.  Caroleen Hamman, MD FACS General Surgeon 04/01/2016, 10:18 AM

## 2016-04-24 NOTE — Progress Notes (Signed)
Dressing to right breast clean and dry at drain site  JP emptied of 2.5cc bright red drainage

## 2016-04-24 NOTE — Transfer of Care (Signed)
Immediate Anesthesia Transfer of Care Note  Patient: Janice [name]  Procedure(s) Performed: Procedure(s): BREAST LUMPECTOMY WITH NEEDLE LOCALIZATION (Right) SENTINEL NODE BIOPSY (Right)  Patient Location: PACU  Anesthesia Type:General  Level of Consciousness: sedated  Airway & Oxygen Therapy: Patient Spontanous Breathing and Patient connected to face mask oxygen  Post-op Assessment: Report given to RN and Post -op Vital signs reviewed and stable  Post vital signs: Reviewed and stable  Last Vitals:  Vitals:   04/24/16 0948  BP: (!) 175/75  Pulse: 91  Resp: 17  Temp: 36.5 C    Last Pain:  Vitals:   04/24/16 0948  TempSrc: Oral         Complications: No apparent anesthesia complications

## 2016-04-27 ENCOUNTER — Encounter: Payer: Self-pay | Admitting: Surgery

## 2016-04-28 ENCOUNTER — Telehealth: Payer: Self-pay | Admitting: Surgery

## 2016-04-28 LAB — SURGICAL PATHOLOGY

## 2016-04-28 NOTE — Telephone Encounter (Signed)
D/w pt in detail about pathology and she understands. She is very Patent attorney. F/U next Monday

## 2016-04-30 LAB — SURGICAL PATHOLOGY

## 2016-05-04 ENCOUNTER — Ambulatory Visit (INDEPENDENT_AMBULATORY_CARE_PROVIDER_SITE_OTHER): Payer: Medicare Other | Admitting: Surgery

## 2016-05-04 DIAGNOSIS — Z09 Encounter for follow-up examination after completed treatment for conditions other than malignant neoplasm: Secondary | ICD-10-CM

## 2016-05-04 MED ORDER — AMOXICILLIN-POT CLAVULANATE 875-125 MG PO TABS: 1.0000 | ORAL_TABLET | Freq: Two times a day (BID) | ORAL | 0 refills | Status: DC

## 2016-05-04 NOTE — Addendum Note (Signed)
Addended by: Wayna Chalet on: 05/04/2016 03:09 PM   Modules accepted: Orders

## 2016-05-04 NOTE — Progress Notes (Signed)
S/p right lumpectomy SLNBx for DCIS w comedo necrosis Path no evidence of residual DCIS , negative lymph node  She ios doing well, minimal output from drain less 5cc Taking PO, minimal pain  PE NAD Wound healing well, no infection, some retraction of subq from closure of lumpectomy. No evidence of infection. Drain removed  A/P doing well Pending oncology and radiation re evaluation No need for further surgical re-excision Path d/w pt in detail

## 2016-05-11 ENCOUNTER — Ambulatory Visit
Admission: RE | Admit: 2016-05-11 | Discharge: 2016-05-11 | Disposition: A | Discharge status: Home / Self Care | Payer: Medicare Other | Source: Ambulatory Visit | Attending: Radiation Oncology | Admitting: Radiation Oncology

## 2016-05-11 ENCOUNTER — Inpatient Hospital Stay: Payer: Medicare Other | Attending: Oncology | Admitting: Oncology

## 2016-05-11 VITALS — BP 173/72 | HR 79 | Temp 97.4°F | Resp 18 | Wt 148.9 lb

## 2016-05-11 DIAGNOSIS — D0511 Intraductal carcinoma in situ of right breast: Secondary | ICD-10-CM | POA: Insufficient documentation

## 2016-05-11 DIAGNOSIS — I10 Essential (primary) hypertension: Secondary | ICD-10-CM | POA: Diagnosis not present

## 2016-05-11 DIAGNOSIS — Z8572 Personal history of non-Hodgkin lymphomas: Secondary | ICD-10-CM | POA: Insufficient documentation

## 2016-05-11 DIAGNOSIS — Z85038 Personal history of other malignant neoplasm of large intestine: Secondary | ICD-10-CM | POA: Insufficient documentation

## 2016-05-11 DIAGNOSIS — Z800 Family history of malignant neoplasm of digestive organs: Secondary | ICD-10-CM | POA: Insufficient documentation

## 2016-05-11 DIAGNOSIS — E7800 Pure hypercholesterolemia, unspecified: Secondary | ICD-10-CM | POA: Diagnosis not present

## 2016-05-11 DIAGNOSIS — Z803 Family history of malignant neoplasm of breast: Secondary | ICD-10-CM | POA: Diagnosis not present

## 2016-05-11 DIAGNOSIS — Z7982 Long term (current) use of aspirin: Secondary | ICD-10-CM | POA: Insufficient documentation

## 2016-05-11 DIAGNOSIS — Z85828 Personal history of other malignant neoplasm of skin: Secondary | ICD-10-CM | POA: Insufficient documentation

## 2016-05-11 DIAGNOSIS — Z79899 Other long term (current) drug therapy: Secondary | ICD-10-CM | POA: Insufficient documentation

## 2016-05-11 DIAGNOSIS — Z79810 Long term (current) use of selective estrogen receptor modulators (SERMs): Secondary | ICD-10-CM | POA: Diagnosis not present

## 2016-05-11 DIAGNOSIS — Z801 Family history of malignant neoplasm of trachea, bronchus and lung: Secondary | ICD-10-CM | POA: Insufficient documentation

## 2016-05-11 DIAGNOSIS — Z809 Family history of malignant neoplasm, unspecified: Secondary | ICD-10-CM | POA: Insufficient documentation

## 2016-05-11 DIAGNOSIS — Z170 Estrogen receptor positive status [ER+]: Secondary | ICD-10-CM | POA: Insufficient documentation

## 2016-05-11 MED ORDER — TAMOXIFEN CITRATE 20 MG PO TABS: 20.0000 mg | ORAL_TABLET | Freq: Every day | ORAL | 11 refills | Status: DC

## 2016-05-11 NOTE — Progress Notes (Signed)
[place]  [phone] Fax:(336) [phone]  ID: Janice [name] OB: 1928-12-07  MR#: [id number]  QIO#:[id number]  Patient Care Team: Sherrin Daisy, MD as PCP - General (Family Medicine)  CHIEF COMPLAINT: Right breast DCIS, high grade.  INTERVAL HISTORY: Patient returns to clinic today for further evaluation, discussion of her final pathology results, and treatment planning. She has some mild breast tenderness at the site of her surgery, but otherwise feels well. She has no neurologic complaints. She denies any other pain. She denies any fevers or night sweats. She has no chest pain or shortness of breath. She denies any nausea, vomiting, constipation, or diarrhea. She denies any melena. She has no urinary complaints. Patient offers no further specific complaints today.  REVIEW OF SYSTEMS:   Review of Systems  Constitutional: Negative.  Negative for fever, malaise/fatigue and weight loss.  Respiratory: Negative.  Negative for cough and shortness of breath.   Cardiovascular: Negative.  Negative for chest pain and leg swelling.  Gastrointestinal: Negative.  Negative for abdominal pain.  Genitourinary: Negative.   Musculoskeletal: Negative.   Neurological: Negative.  Negative for weakness.  Psychiatric/Behavioral: The patient is nervous/anxious.     As per HPI. Otherwise, a complete review of systems is negative.  PAST MEDICAL HISTORY: Past Medical History:  Diagnosis Date  . Colon cancer ([place]) 2003   adenocarcinoma of the ascending colon  . Environmental allergies   . High cholesterol   . Hypertension   . Large cell lymphoma ([place]) 2007   Diffuse large cell lymphoma, completed CHOP and Rituxan in March 2004  . Skin cancer     PAST SURGICAL HISTORY: Past Surgical History:  Procedure Laterality Date  . BREAST BIOPSY Right 03/18/2016   path pending  . BREAST LUMPECTOMY WITH NEEDLE LOCALIZATION Right 04/24/2016   Procedure: BREAST LUMPECTOMY WITH  NEEDLE LOCALIZATION;  Surgeon: Jules Husbands, MD;  Location: [facility];  Service: General;  Laterality: Right;  . COLON SURGERY  11/24/05   Cancer surgery  . COLONOSCOPY     2012  . COLONOSCOPY N/A 08/28/2015   Procedure: COLONOSCOPY;  Surgeon: Hulen Luster, MD;  Location: [place];  Service: Gastroenterology;  Laterality: N/A;  . DIAGNOSTIC MAMMOGRAM     May 2014  . LYMPH NODE DISSECTION  2003   neck - non-hodgkin's lymphoma  . SENTINEL NODE BIOPSY Right 04/24/2016   Procedure: SENTINEL NODE BIOPSY;  Surgeon: Jules Husbands, MD;  Location: [facility];  Service: General;  Laterality: Right;    FAMILY HISTORY Family History  Problem Relation Age of Onset  . Breast cancer Sister 35    half sister  . Cancer Other     family history of colon cancer, breast cancer, lung cancer  . Cancer Paternal Uncle     colon       ADVANCED DIRECTIVES:    HEALTH MAINTENANCE: Social History  Substance Use Topics  . Smoking status: Never Smoker  . Smokeless tobacco: Never Used  . Alcohol use No     Colonoscopy:  PAP:  Bone density:  Lipid panel:  No Known Allergies  Current Outpatient Prescriptions  Medication Sig Dispense Refill  . acetaminophen (TYLENOL) 500 MG tablet Take 500 mg by mouth every 6 (six) hours as needed.    Marland Kitchen amLODipine (NORVASC) 2.5 MG tablet Take 2.5 mg by mouth daily.    Marland Kitchen aspirin 81 MG tablet Take 81 mg by mouth daily.    Marland Kitchen atorvastatin (LIPITOR) 10 MG tablet Take  10 mg by mouth daily.    . diphenhydrAMINE (SOMINEX) 25 MG tablet Take 25 mg by mouth at bedtime as needed for sleep.    Marland Kitchen lisinopril (PRINIVIL,ZESTRIL) 20 MG tablet Take 20 mg by mouth daily.    . tamoxifen (NOLVADEX) 20 MG tablet Take 1 tablet (20 mg total) by mouth daily. 30 tablet 11   No current facility-administered medications for this visit.     OBJECTIVE: Vitals:   05/11/16 1013  BP: (!) 173/72  Pulse: 79  Resp: 18  Temp: 97.4 F (36.3 C)     Body mass index is 26.38 kg/m.    ECOG  FS:0 - Asymptomatic  General: Well-developed, well-nourished, no acute distress. Eyes: anicteric sclera. Lungs: Clear to auscultation bilaterally. Breasts: Well-healed surgical scar on right breast. No palpable lumps or masses. Heart: Regular rate and rhythm. No rubs, murmurs, or gallops. Abdomen: Soft, nontender, nondistended. No organomegaly noted, normoactive bowel sounds. Musculoskeletal: No edema, cyanosis, or clubbing. Neuro: Alert, answering all questions appropriately. Cranial nerves grossly intact. Skin: No rashes or petechiae noted. Psych: Normal affect.   LAB RESULTS:  Lab Results  Component Value Date   NA 139 04/21/2016   K 4.0 04/21/2016   CL 102 04/21/2016   CO2 30 04/21/2016   GLUCOSE 107 (H) 04/21/2016   BUN 16 04/21/2016   CREATININE 0.83 04/21/2016   CALCIUM 9.7 04/21/2016   PROT 7.5 04/21/2016   ALBUMIN 4.0 04/21/2016   AST 20 04/21/2016   ALT 19 04/21/2016   ALKPHOS 60 04/21/2016   BILITOT 0.4 04/21/2016   GFRNONAA >60 04/21/2016   GFRAA >60 04/21/2016    Lab Results  Component Value Date   WBC 4.8 04/21/2016   NEUTROABS 2.9 04/21/2016   HGB 14.0 04/21/2016   HCT 42.5 04/21/2016   MCV 94.9 04/21/2016   PLT 209 04/21/2016     STUDIES: Mr Breast Bilateral W Wo Contrast  Result Date: 04/17/2016 CLINICAL DATA:  82 year old female presenting for evaluation of bilateral breasts status post recent diagnosis of high-grade DCIS in the right breast identified following biopsy of suspicious calcifications in the upper-outer quadrant. The patient had a palpable lump in the right breast following biopsy which on diagnostic workup was found to be compatible with a hematoma and a superficial thrombophlebitis (Mondor's Disease). LABS:  Not applicable. EXAM: BILATERAL BREAST MRI WITH AND WITHOUT CONTRAST TECHNIQUE: Multiplanar, multisequence MR images of both breasts were obtained prior to and following the intravenous administration of 14 ml of MultiHance.  THREE-DIMENSIONAL MR IMAGE RENDERING ON INDEPENDENT WORKSTATION: Three-dimensional MR images were rendered by post-processing of the original MR data on an independent workstation. The three-dimensional MR images were interpreted, and findings are reported in the following complete MRI report for this study. Three dimensional images were evaluated at the independent DynaCad workstation COMPARISON:  No prior MRI available for comparison. Correlation made with prior ultrasounds and mammograms. FINDINGS: Breast composition: c. Heterogeneous fibroglandular tissue. Background parenchymal enhancement: Minimal. Right breast: Mild linear non mass enhancement is noted in the upper-outer quadrant of the right breast extending to the skin surface of the upper outer breast, most compatible with the biopsy tract. Toward the end of this tract, susceptibility artifact from the biopsy marking clip in the upper-outer quadrant of the breast is seen denoting the site of biopsy. The linear non mass enhancement continues approximately 1.5 cm posterior to the biopsy marking clip. This MRI cannot differentiate postprocedural enhancement with that of any residual DCIS at the biopsy site. There is  no residual hematoma identified at this site. Left breast: No mass or abnormal enhancement. Lymph nodes: No abnormal appearing lymph nodes. Ancillary findings:  None. IMPRESSION: 1. There is linear non mass enhancement extending from the skin surface of the upper-outer quadrant to the biopsy marking clip in the upper-outer quadrant and continues 1.5 cm posterior to the clip. This is compatible with the biopsy tract, though this enhancement cannot be differentiated from any residual DCIS at the biopsy site. 2.  No MRI evidence of left breast malignancy. RECOMMENDATION: Continue treatment plan. BI-RADS CATEGORY  6: Known biopsy-proven malignancy. Electronically Signed   By: Ammie Ferrier M.D.   On: 04/17/2016 09:04   Nm Sentinel Node  Injection  Result Date: 04/24/2016 CLINICAL DATA:  Right breast cancer. EXAM: NUCLEAR MEDICINE BREAST LYMPHOSCINTIGRAPHY RIGHT BREAST TECHNIQUE: Intradermal injection of radiopharmaceutical was performed at the 12 o'clock, 3 o'clock, 6 o'clock, and 9 o'clock positions around the right nipple. The patient was then sent to the operating room where the sentinel node(s) were identified and removed by the surgeon. RADIOPHARMACEUTICALS:  Total of 1 mCi Millipore-filtered Technetium-1msulfur colloid, injected into the right periareolar region. IMPRESSION: Uncomplicated intradermal injection of a total of 1 mCi Technetium-937mulfur colloid for purposes of sentinel node identification. Electronically Signed   By: ThMarcello MooresRegister   On: 04/24/2016 10:10   Mm Breast Surgical Specimen  Result Date: 04/24/2016 CLINICAL DATA:  Status post lumpectomy right breast. EXAM: SPECIMEN RADIOGRAPH OF THE RIGHT BREAST COMPARISON:  Previous exam(s). FINDINGS: Status post excision of the right breast. The wire tip and biopsy marker clip are present and are marked for pathology. IMPRESSION: Specimen radiograph of the right breast. Electronically Signed   By: ElNolon Nations.D.   On: 04/24/2016 15:23   Mm Rt Plc Breast Loc Dev   1st Lesion  Inc Mammo Guide  Result Date: 04/24/2016 CLINICAL DATA:  Patient presents for needle localization prior to lumpectomy. Recent stereotactic guided core biopsy of right breast calcifications reveals high-grade ductal carcinoma in situ. EXAM: NEEDLE LOCALIZATION OF THE RIGHT BREAST WITH MAMMO GUIDANCE COMPARISON:  Previous exams. FINDINGS: Patient presents for needle localization prior to lumpectomy. I met with the patient and we discussed the procedure of needle localization including benefits and alternatives. We discussed the high likelihood of a successful procedure. We discussed the risks of the procedure, including infection, bleeding, tissue injury, and further surgery. Informed, written  consent was given. The usual time-out protocol was performed immediately prior to the procedure. Using mammographic guidance, sterile technique, 1% lidocaine and a 9 cm modified Kopans needle, tissue marker clip in the lateral portion of the right breast is localized using a lateral approach. The images were marked for Dr. PaDahlia ByesIMPRESSION: Needle localization of the right breast. No apparent complications. Electronically Signed   By: ElNolon Nations.D.   On: 04/24/2016 09:12    ASSESSMENT: Right breast DCIS, high grade.   PLAN:    1. Right breast DCIS, high grade: Final pathology did not reveal an invasive component. Patient will require lumpectomy likely followed by adjuvant XRT. She also had consultation with radiation oncology today, but ultimately declined not to pursue adjuvant treatment given her multiple comorbidities. Patient was given a prescription for tamoxifen today which she will be required to take for 5 years completing in October 2022. Return to clinic in 2 months with repeat laboratory work and further evaluation.  2.  History of B-cell lymphoma: Patient completed her treatments in March 2004. Her most recent  PET scan completed in January 2012 did not reveal any evidence of recurrence. No intervention is needed at this time. 3.  History of colon cancer: No evidence of disease. She had her resection in May 2007.  Patient's most recent colonoscopy in August 2011 was reported as negative.  Approximately 30 minutes was spent in discussion of which greater than 50% was consultation.  Patient expressed understanding and was in agreement with this plan. She also understands that She can call clinic at any time with any questions, concerns, or complaints.   Lloyd Huger, MD   05/16/2016 9:03 AM

## 2016-05-11 NOTE — Progress Notes (Signed)
States is slightly tender from surgery today but feeling well.

## 2016-05-11 NOTE — Consult Note (Signed)
NEW PATIENT EVALUATION  Name: Janice [name]  MRN: [id number]  Date:   05/11/2016     DOB: 25-Dec-1928   This 82 y.o. female patient presents to the clinic for initial evaluation of ductal carcinoma of the right breast status post wide local excision and sentinel node biopsy stage 0 (Tis N0 M0) ER positive PR negative.  REFERRING PHYSICIAN: Sherrin Daisy, MD  CHIEF COMPLAINT:  Chief Complaint  Patient presents with  . Breast Cancer    Pt is here for initial consultation of breast cancer.      DIAGNOSIS: The encounter diagnosis was Ductal carcinoma in situ of right breast.   PREVIOUS INVESTIGATIONS:  Mammograms ultrasound and MRI scan of breast reviewed Pathology reports reviewed Clinical notes reviewed  HPI: Patient is a 82 year old female with significant past medical history for adenocarcinoma the ascending colon and diffuse large cell lymphoma treated back in 2004. She recently presented with an abnormal mammogram of the right breast showing 8 mm group of calcifications in the 4 port posterior upper outer quadrant of the right breast. Stereotactic biopsy was performed showing ductal carcinoma in situ ER/PR positive PR negative. MRI scan of the breast were performed showing linear non-mass enhancement extending from the skin to the upper outer quadrant of the biopsy marking clip with is compatible with biopsy tract. She underwent a reexcision wide local excision and sentinel node biopsy again showing no residual ductal carcinoma in situ and 2 sentinel lymph nodes negative for metastatic disease. Tumor did show comedonecrosis. She tolerated her surgery well seen today for radiation oncology opinion. She is been prescribed tamoxifen therapy.  PLANNED TREATMENT REGIMEN: Observation and tamoxifen  PAST MEDICAL HISTORY:  has a past medical history of Colon cancer ([place]) (2003); Environmental allergies; High cholesterol; Hypertension; Large cell lymphoma ([place]) (2007); and Skin cancer.     PAST SURGICAL HISTORY:  Past Surgical History:  Procedure Laterality Date  . BREAST BIOPSY Right 03/18/2016   path pending  . BREAST LUMPECTOMY WITH NEEDLE LOCALIZATION Right 04/24/2016   Procedure: BREAST LUMPECTOMY WITH NEEDLE LOCALIZATION;  Surgeon: Jules Husbands, MD;  Location: [facility];  Service: General;  Laterality: Right;  . COLON SURGERY  11/24/05   Cancer surgery  . COLONOSCOPY     2012  . COLONOSCOPY N/A 08/28/2015   Procedure: COLONOSCOPY;  Surgeon: Hulen Luster, MD;  Location: [place];  Service: Gastroenterology;  Laterality: N/A;  . DIAGNOSTIC MAMMOGRAM     May 2014  . LYMPH NODE DISSECTION  2003   neck - non-hodgkin's lymphoma  . SENTINEL NODE BIOPSY Right 04/24/2016   Procedure: SENTINEL NODE BIOPSY;  Surgeon: Jules Husbands, MD;  Location: [facility];  Service: General;  Laterality: Right;    FAMILY HISTORY: family history includes Breast cancer (age of onset: 58) in her sister; Cancer in her other and paternal uncle.  SOCIAL HISTORY:  reports that she has never smoked. She has never used smokeless tobacco. She reports that she does not drink alcohol or use drugs.  ALLERGIES: Review of patient's allergies indicates no known allergies.  MEDICATIONS:  Current Outpatient Prescriptions  Medication Sig Dispense Refill  . acetaminophen (TYLENOL) 500 MG tablet Take 500 mg by mouth every 6 (six) hours as needed.    Marland Kitchen amLODipine (NORVASC) 2.5 MG tablet Take 2.5 mg by mouth daily.    Marland Kitchen aspirin 81 MG tablet Take 81 mg by mouth daily.    Marland Kitchen atorvastatin (LIPITOR) 10 MG tablet Take 10 mg by mouth daily.    Marland Kitchen  diphenhydrAMINE (SOMINEX) 25 MG tablet Take 25 mg by mouth at bedtime as needed for sleep.    Marland Kitchen lisinopril (PRINIVIL,ZESTRIL) 20 MG tablet Take 20 mg by mouth daily.     No current facility-administered medications for this encounter.     ECOG PERFORMANCE STATUS:  0 - Asymptomatic  REVIEW OF SYSTEMS:  Patient denies any weight loss, fatigue, weakness, fever,  chills or night sweats. Patient denies any loss of vision, blurred vision. Patient denies any ringing  of the ears or hearing loss. No irregular heartbeat. Patient denies heart murmur or history of fainting. Patient denies any chest pain or pain radiating to her upper extremities. Patient denies any shortness of breath, difficulty breathing at night, cough or hemoptysis. Patient denies any swelling in the lower legs. Patient denies any nausea vomiting, vomiting of blood, or coffee ground material in the vomitus. Patient denies any stomach pain. Patient states has had normal bowel movements no significant constipation or diarrhea. Patient denies any dysuria, hematuria or significant nocturia. Patient denies any problems walking, swelling in the joints or loss of balance. Patient denies any skin changes, loss of hair or loss of weight. Patient denies any excessive worrying or anxiety or significant depression. Patient denies any problems with insomnia. Patient denies excessive thirst, polyuria, polydipsia. Patient denies any swollen glands, patient denies easy bruising or easy bleeding. Patient denies any recent infections, allergies or URI. Patient "s visual fields have not changed significantly in recent time.    PHYSICAL EXAM: There were no vitals taken for this visit. Elderly female in NAD. Breasts are extremely large and pendulous. She is status post wide local excision of the upper outer quadrant of the right breast which is healing well. No dominant mass or nodularity is noted in either breast in 2 positions examined. Well-developed well-nourished patient in NAD. HEENT reveals PERLA, EOMI, discs not visualized.  Oral cavity is clear. No oral mucosal lesions are identified. Neck is clear without evidence of cervical or supraclavicular adenopathy. Lungs are clear to A&P. Cardiac examination is essentially unremarkable with regular rate and rhythm without murmur rub or thrill. Abdomen is benign with no  organomegaly or masses noted. Motor sensory and DTR levels are equal and symmetric in the upper and lower extremities. Cranial nerves II through XII are grossly intact. Proprioception is intact. No peripheral adenopathy or edema is identified. No motor or sensory levels are noted. Crude visual fields are within normal range.  LABORATORY DATA: Pathology reports reviewed    RADIOLOGY RESULTS: MRI scan ultrasound and mammograms reviewed   IMPRESSION: Ductal carcinoma in situ of the right breast as was wide local excision and sentinel node biopsy in 82 year old female  PLAN: Based on the patient's significant comorbidities, extremely large breast size, and ductal carcinoma in situ nature of her disease I would recommend observation in this patient. She has significant risk for significant skin reaction based on the large size of her breasts. I believe based on the small nature for DCIS in that it is ER positive we can just follow her on tamoxifen with serial mammograms. I explained this in detail to the patient and her son and both seem to comprehend my treatment plan well. Patient will start her tamoxifen therapy and be followed by surgery and medical oncology. We'll be happy to reevaluate the patient any time should further opinion be indicated.  I would like to take this opportunity to thank you for allowing me to participate in the care of your patient.Noreene Filbert  S., MD

## 2016-05-12 NOTE — Progress Notes (Signed)
  Oncology Nurse Navigator Documentation  Navigator Location: [facility] (05/12/16 1500)   )Navigator Encounter Type: Telephone (05/12/16 1500) Telephone: Incoming Call;Outgoing Call;Appt Confirmation/Clarification;Education (05/12/16 1500)                   Patient Visit Type: Follow-up;RadOnc (05/12/16 1500)       Interventions: Coordination of Care;Education (05/12/16 1500)                      Time Spent with Patient: 30 (05/12/16 1500)  Patient phoned with questions about radiation treatment.  Clarified with Dr. Baruch Gouty that patient will not require radiation, and she is to start Tamoxifen as ordered.  Phoned patient pharmacy to make sure prescription for tamoxifen received, and notified patient that it is ready.

## 2016-07-21 ENCOUNTER — Encounter: Payer: Self-pay | Admitting: *Deleted

## 2016-07-21 NOTE — Progress Notes (Signed)
  Oncology Nurse Navigator Documentation  Navigator Location: [facility] (07/21/16 1500)   )Navigator Encounter Type: Letter/Fax/Email (07/21/16 1500)    Thinking of you card mailed to patient.       Time Spent with Patient: 15 (07/21/16 1500)

## 2016-07-23 ENCOUNTER — Ambulatory Visit: Payer: Medicare Other | Admitting: Oncology

## 2016-07-29 NOTE — Progress Notes (Signed)
[place]  Telephone:(336) [phone] Fax:(336) [phone]  ID: Tristy Vaden Cropley OB: 23-Aug-1928  MR#: [id number]  [id number]  Patient Care Team: Sherrin Daisy, MD as PCP - General (Family Medicine)  CHIEF COMPLAINT: Right breast DCIS, high grade.  INTERVAL HISTORY: Patient returns to clinic today for further evaluation and follow-up after surgery in October 2017. She denies breast tenderness at the site of her surgery, and otherwise feels well. She reports having gained 15 pounds over the past 3-4 years and would like assistance with weight loss. She has no neurologic complaints. She denies any other pain. She denies any fevers or night sweats. She has no chest pain or shortness of breath. She denies any nausea, vomiting, constipation, or diarrhea. She denies any melena. She has no urinary complaints. Patient offers no further specific complaints today.  REVIEW OF SYSTEMS:   Review of Systems  Constitutional: Negative.  Negative for fever, malaise/fatigue and weight loss.  HENT: Negative for congestion, sinus pain and sore throat.   Respiratory: Negative.  Negative for cough and shortness of breath.   Cardiovascular: Negative.  Negative for chest pain and leg swelling.  Gastrointestinal: Negative.  Negative for blood in stool, constipation, diarrhea, melena, nausea and vomiting.  Genitourinary: Negative.  Negative for frequency, hematuria and urgency.  Musculoskeletal: Negative.   Neurological: Negative.  Negative for tingling, weakness and headaches.  Psychiatric/Behavioral: The patient is not nervous/anxious.     As per HPI. Otherwise, a complete review of systems is negative.  PAST MEDICAL HISTORY: Past Medical History:  Diagnosis Date  . Colon cancer ([place]) 2003   adenocarcinoma of the ascending colon  . Environmental allergies   . High cholesterol   . Hypertension   . Large cell lymphoma ([place]) 2007   Diffuse large cell lymphoma, completed CHOP and Rituxan in  March 2004  . Skin cancer     PAST SURGICAL HISTORY: Past Surgical History:  Procedure Laterality Date  . BREAST BIOPSY Right 03/18/2016   path pending  . BREAST LUMPECTOMY WITH NEEDLE LOCALIZATION Right 04/24/2016   Procedure: BREAST LUMPECTOMY WITH NEEDLE LOCALIZATION;  Surgeon: Jules Husbands, MD;  Location: [facility];  Service: General;  Laterality: Right;  . COLON SURGERY  11/24/05   Cancer surgery  . COLONOSCOPY     2012  . COLONOSCOPY N/A 08/28/2015   Procedure: COLONOSCOPY;  Surgeon: Hulen Luster, MD;  Location: [place];  Service: Gastroenterology;  Laterality: N/A;  . DIAGNOSTIC MAMMOGRAM     May 2014  . LYMPH NODE DISSECTION  2003   neck - non-hodgkin's lymphoma  . SENTINEL NODE BIOPSY Right 04/24/2016   Procedure: SENTINEL NODE BIOPSY;  Surgeon: Jules Husbands, MD;  Location: [facility];  Service: General;  Laterality: Right;    FAMILY HISTORY Family History  Problem Relation Age of Onset  . Breast cancer Sister 32    half sister  . Cancer Other     family history of colon cancer, breast cancer, lung cancer  . Cancer Paternal Uncle     colon       ADVANCED DIRECTIVES:    HEALTH MAINTENANCE: Social History  Substance Use Topics  . Smoking status: Never Smoker  . Smokeless tobacco: Never Used  . Alcohol use No     Colonoscopy:  PAP:  Bone density:  Lipid panel:  No Known Allergies  Current Outpatient Prescriptions  Medication Sig Dispense Refill  . acetaminophen (TYLENOL) 500 MG tablet Take 500 mg by mouth every 6 (six)  hours as needed.    Marland Kitchen amLODipine (NORVASC) 2.5 MG tablet Take 2.5 mg by mouth daily.    Marland Kitchen aspirin 81 MG tablet Take 81 mg by mouth daily.    Marland Kitchen atorvastatin (LIPITOR) 10 MG tablet Take 10 mg by mouth daily.    . diphenhydrAMINE (SOMINEX) 25 MG tablet Take 25 mg by mouth at bedtime as needed for sleep.    Marland Kitchen lisinopril (PRINIVIL,ZESTRIL) 20 MG tablet Take 20 mg by mouth daily.    . tamoxifen (NOLVADEX) 20 MG tablet Take 1 tablet (20  mg total) by mouth daily. 30 tablet 11   No current facility-administered medications for this visit.     OBJECTIVE: Vitals:   07/31/16 1400  BP: (!) 165/72  Pulse: 82  Resp: 18  Temp: 98.5 F (36.9 C)     Body mass index is 27.14 kg/m.    ECOG FS:0 - Asymptomatic  General: Well-developed, well-nourished, no acute distress. Eyes: anicteric sclera. Lungs: Clear to auscultation bilaterally. Breasts: Well-healed surgical scar on right breast. No palpable lumps or masses. Heart: Regular rate and rhythm. No rubs, murmurs, or gallops. Abdomen: Soft, nontender, nondistended. No organomegaly noted, normoactive bowel sounds. Musculoskeletal: No edema, cyanosis, or clubbing. Neuro: Alert, answering all questions appropriately. Cranial nerves grossly intact. Skin: No rashes or petechiae noted. Psych: Normal affect.   LAB RESULTS:  Lab Results  Component Value Date   NA 139 04/21/2016   K 4.0 04/21/2016   CL 102 04/21/2016   CO2 30 04/21/2016   GLUCOSE 107 (H) 04/21/2016   BUN 16 04/21/2016   CREATININE 0.83 04/21/2016   CALCIUM 9.7 04/21/2016   PROT 7.5 04/21/2016   ALBUMIN 4.0 04/21/2016   AST 20 04/21/2016   ALT 19 04/21/2016   ALKPHOS 60 04/21/2016   BILITOT 0.4 04/21/2016   GFRNONAA >60 04/21/2016   GFRAA >60 04/21/2016    Lab Results  Component Value Date   WBC 4.8 04/21/2016   NEUTROABS 2.9 04/21/2016   HGB 14.0 04/21/2016   HCT 42.5 04/21/2016   MCV 94.9 04/21/2016   PLT 209 04/21/2016     STUDIES: No results found.  ASSESSMENT: Right breast DCIS, high grade.   PLAN:    1. Right breast DCIS, high grade: Final pathology did not reveal an invasive component. Patient At her lumpectomy on April 24, 2016, but declined adjuvant XRT. Patient is currently taking tamoxifen, started in October 2017, and will take for 5 years, completing in October 2022. Return to clinic in 6 months for further evaluation.  2.  History of B-cell lymphoma: Patient completed her  treatments in March 2004. Her most recent PET scan completed in January 2012 did not reveal any evidence of recurrence. No intervention is needed at this time. 3.  History of colon cancer: No evidence of disease. She had her resection in May 2007.  Patient's most recent colonoscopy in February 2017 was reported as negative.  Patient expressed understanding and was in agreement with this plan. She also understands that She can call clinic at any time with any questions, concerns, or complaints.   Lucendia Herrlich, NP  08/02/16 6:51 PM   Patient was seen and evaluated independently and I agree with the assessment and plan as dictated above. Patient expressed understanding the risks of foregoing adjuvant XRT, but given her advanced age and comorbidities it was agreed upon that this is not necessary.  Lloyd Huger, MD 08/02/16 6:53 PM

## 2016-07-31 ENCOUNTER — Inpatient Hospital Stay: Payer: Medicare Other | Attending: Oncology | Admitting: Oncology

## 2016-07-31 VITALS — BP 165/72 | HR 82 | Temp 98.5°F | Resp 18 | Wt 153.2 lb

## 2016-07-31 DIAGNOSIS — D0511 Intraductal carcinoma in situ of right breast: Secondary | ICD-10-CM | POA: Insufficient documentation

## 2016-07-31 DIAGNOSIS — Z9221 Personal history of antineoplastic chemotherapy: Secondary | ICD-10-CM | POA: Diagnosis not present

## 2016-07-31 DIAGNOSIS — Z801 Family history of malignant neoplasm of trachea, bronchus and lung: Secondary | ICD-10-CM | POA: Diagnosis not present

## 2016-07-31 DIAGNOSIS — Z803 Family history of malignant neoplasm of breast: Secondary | ICD-10-CM | POA: Insufficient documentation

## 2016-07-31 DIAGNOSIS — Z85828 Personal history of other malignant neoplasm of skin: Secondary | ICD-10-CM | POA: Diagnosis not present

## 2016-07-31 DIAGNOSIS — Z170 Estrogen receptor positive status [ER+]: Secondary | ICD-10-CM | POA: Diagnosis not present

## 2016-07-31 DIAGNOSIS — Z800 Family history of malignant neoplasm of digestive organs: Secondary | ICD-10-CM | POA: Insufficient documentation

## 2016-07-31 DIAGNOSIS — I10 Essential (primary) hypertension: Secondary | ICD-10-CM | POA: Diagnosis not present

## 2016-07-31 DIAGNOSIS — E7800 Pure hypercholesterolemia, unspecified: Secondary | ICD-10-CM | POA: Insufficient documentation

## 2016-07-31 DIAGNOSIS — Z79810 Long term (current) use of selective estrogen receptor modulators (SERMs): Secondary | ICD-10-CM | POA: Insufficient documentation

## 2016-07-31 DIAGNOSIS — R635 Abnormal weight gain: Secondary | ICD-10-CM | POA: Insufficient documentation

## 2016-07-31 DIAGNOSIS — Z8572 Personal history of non-Hodgkin lymphomas: Secondary | ICD-10-CM

## 2016-07-31 DIAGNOSIS — Z85038 Personal history of other malignant neoplasm of large intestine: Secondary | ICD-10-CM | POA: Diagnosis not present

## 2016-07-31 DIAGNOSIS — Z7982 Long term (current) use of aspirin: Secondary | ICD-10-CM | POA: Insufficient documentation

## 2016-07-31 DIAGNOSIS — Z79899 Other long term (current) drug therapy: Secondary | ICD-10-CM | POA: Diagnosis not present

## 2016-07-31 NOTE — Progress Notes (Signed)
Offers no complaints. States is feeling well. 

## 2017-01-26 NOTE — Progress Notes (Signed)
[name]  Telephone:(336) [phone] Fax:(336) [phone]  ID: Janice [name] OB: 10-Feb-1929  MR#: [id number]  FTD#:[id number]  Patient Care Team: Sherrin Daisy, MD as PCP - General (Family Medicine)  CHIEF COMPLAINT: Right breast DCIS, high grade.  INTERVAL HISTORY: Patient returns to clinic today for routine 6 month evaluation. She has some mild tenderness in her left neck at the site of her non-Hodgkin's lymphoma, but otherwise feels well and is asymptomatic. She is tolerating tamoxifen without significant side effects. She has no neurologic complaints. She denies any other pain. She denies any fevers or night sweats. She has no chest pain or shortness of breath. She denies any nausea, vomiting, constipation, or diarrhea. She denies any melena. She has no urinary complaints. Patient offers no further specific complaints today.  REVIEW OF SYSTEMS:   Review of Systems  Constitutional: Negative.  Negative for fever, malaise/fatigue and weight loss.  HENT: Negative for congestion, sinus pain and sore throat.   Respiratory: Negative.  Negative for cough and shortness of breath.   Cardiovascular: Negative.  Negative for chest pain and leg swelling.  Gastrointestinal: Negative.  Negative for blood in stool, constipation, diarrhea, melena, nausea and vomiting.  Genitourinary: Negative.  Negative for frequency, hematuria and urgency.  Musculoskeletal: Positive for neck pain.  Neurological: Negative.  Negative for tingling, weakness and headaches.  Psychiatric/Behavioral: The patient is not nervous/anxious.     As per HPI. Otherwise, a complete review of systems is negative.  PAST MEDICAL HISTORY: Past Medical History:  Diagnosis Date  . Breast cancer ([place])    DCIS  . Colon cancer [facility]) 2003   adenocarcinoma of the ascending colon, 2007  . Environmental allergies   . High cholesterol   . Hypertension   . Large cell lymphoma ([place]) 2007   Diffuse large cell lymphoma,  completed CHOP and Rituxan in March 2004  . Non Hodgkin's lymphoma ([place]) 2003  . Skin cancer     PAST SURGICAL HISTORY: Past Surgical History:  Procedure Laterality Date  . BREAST BIOPSY Right 03/18/2016   path pending  . BREAST LUMPECTOMY WITH NEEDLE LOCALIZATION Right 04/24/2016   Procedure: BREAST LUMPECTOMY WITH NEEDLE LOCALIZATION;  Surgeon: Jules Husbands, MD;  Location: [facility];  Service: General;  Laterality: Right;  . COLON SURGERY  11/24/05   Cancer surgery  . COLONOSCOPY     2012  . COLONOSCOPY N/A 08/28/2015   Procedure: COLONOSCOPY;  Surgeon: Hulen Luster, MD;  Location: [place];  Service: Gastroenterology;  Laterality: N/A;  . DIAGNOSTIC MAMMOGRAM     May 2014  . LYMPH NODE DISSECTION  2003   neck - non-hodgkin's lymphoma  . SENTINEL NODE BIOPSY Right 04/24/2016   Procedure: SENTINEL NODE BIOPSY;  Surgeon: Jules Husbands, MD;  Location: [facility];  Service: General;  Laterality: Right;    FAMILY HISTORY Family History  Problem Relation Age of Onset  . Breast cancer Sister 20       half sister  . Cancer Other        family history of colon cancer, breast cancer, lung cancer  . Cancer Paternal Uncle        colon  . Colon cancer Maternal Uncle   . Stomach cancer Maternal Grandmother        ADVANCED DIRECTIVES:    HEALTH MAINTENANCE: Social History  Substance Use Topics  . Smoking status: Never Smoker  . Smokeless tobacco: Never Used  . Alcohol use No     Colonoscopy:  PAP:  Bone density:  Lipid panel:  No Known Allergies  Current Outpatient Prescriptions  Medication Sig Dispense Refill  . acetaminophen (TYLENOL) 500 MG tablet Take 500 mg by mouth every 6 (six) hours as needed.    Marland Kitchen amLODipine (NORVASC) 2.5 MG tablet Take 2.5 mg by mouth daily.    Marland Kitchen aspirin 81 MG tablet Take 81 mg by mouth daily.    Marland Kitchen atorvastatin (LIPITOR) 10 MG tablet Take 10 mg by mouth daily.    . diphenhydrAMINE (SOMINEX) 25 MG tablet Take 25 mg by mouth at bedtime as  needed for sleep.    Marland Kitchen lisinopril (PRINIVIL,ZESTRIL) 20 MG tablet Take 20 mg by mouth daily.    . tamoxifen (NOLVADEX) 20 MG tablet Take 1 tablet (20 mg total) by mouth daily. 30 tablet 11   No current facility-administered medications for this visit.     OBJECTIVE: Vitals:   01/29/17 1109  BP: (!) 150/82  Pulse: 74  Resp: 18  Temp: 98.7 F (37.1 C)     Body mass index is 26.91 kg/m.    ECOG FS:0 - Asymptomatic  General: Well-developed, well-nourished, no acute distress. Eyes: anicteric sclera. HEENT: Clear oropharynx. No palpable cervical or supraclavicular lymphadenopathy. Lungs: Clear to auscultation bilaterally. Breasts: Well-healed surgical scar on right breast. No palpable lumps or masses. Heart: Regular rate and rhythm. No rubs, murmurs, or gallops. Abdomen: Soft, nontender, nondistended. No organomegaly noted, normoactive bowel sounds. Musculoskeletal: No edema, cyanosis, or clubbing. Neuro: Alert, answering all questions appropriately. Cranial nerves grossly intact. Skin: No rashes or petechiae noted. Psych: Normal affect.   LAB RESULTS:  Lab Results  Component Value Date   NA 139 04/21/2016   K 4.0 04/21/2016   CL 102 04/21/2016   CO2 30 04/21/2016   GLUCOSE 107 (H) 04/21/2016   BUN 16 04/21/2016   CREATININE 0.83 04/21/2016   CALCIUM 9.7 04/21/2016   PROT 7.5 04/21/2016   ALBUMIN 4.0 04/21/2016   AST 20 04/21/2016   ALT 19 04/21/2016   ALKPHOS 60 04/21/2016   BILITOT 0.4 04/21/2016   GFRNONAA >60 04/21/2016   GFRAA >60 04/21/2016    Lab Results  Component Value Date   WBC 4.8 04/21/2016   NEUTROABS 2.9 04/21/2016   HGB 14.0 04/21/2016   HCT 42.5 04/21/2016   MCV 94.9 04/21/2016   PLT 209 04/21/2016     STUDIES: No results found.  ASSESSMENT: Right breast DCIS, high grade.   PLAN:    1. Right breast DCIS, high grade: Final pathology did not reveal an invasive component. Patient had her lumpectomy on April 24, 2016, but declined  adjuvant XRT. Patient expressed understanding the risks of foregoing adjuvant XRT, but given her advanced age and comorbidities it was agreed upon that this is not necessary.  Patient has been instructed to continue tamoxifen for a total 5 years completing in October 2022. Patient will require repeat mammogram in August 2018.  Return to clinic in 6 months for further evaluation.  2.  History of B-cell lymphoma: Patient completed her treatments in March 2004. Her most recent PET scan completed in January 2012 did not reveal any evidence of recurrence. No intervention is needed at this time. 3.  History of colon cancer: No evidence of disease. She had her resection in May 2007.  Patient's most recent colonoscopy in February 2017 was reported as negative.  Patient expressed understanding and was in agreement with this plan. She also understands that She can call clinic at any time with  any questions, concerns, or complaints.   Lloyd Huger, MD 01/29/17 11:36 AM

## 2017-01-29 ENCOUNTER — Inpatient Hospital Stay: Payer: Medicare Other | Attending: Oncology | Admitting: Oncology

## 2017-01-29 ENCOUNTER — Ambulatory Visit: Payer: Medicare Other | Admitting: Oncology

## 2017-01-29 ENCOUNTER — Encounter: Payer: Self-pay | Admitting: Oncology

## 2017-01-29 VITALS — BP 150/82 | HR 74 | Temp 98.7°F | Resp 18 | Ht 63.0 in | Wt 151.9 lb

## 2017-01-29 DIAGNOSIS — Z85828 Personal history of other malignant neoplasm of skin: Secondary | ICD-10-CM | POA: Diagnosis not present

## 2017-01-29 DIAGNOSIS — D0511 Intraductal carcinoma in situ of right breast: Secondary | ICD-10-CM | POA: Diagnosis not present

## 2017-01-29 DIAGNOSIS — Z79810 Long term (current) use of selective estrogen receptor modulators (SERMs): Secondary | ICD-10-CM | POA: Diagnosis not present

## 2017-01-29 DIAGNOSIS — Z79899 Other long term (current) drug therapy: Secondary | ICD-10-CM | POA: Insufficient documentation

## 2017-01-29 DIAGNOSIS — Z8572 Personal history of non-Hodgkin lymphomas: Secondary | ICD-10-CM | POA: Diagnosis not present

## 2017-01-29 DIAGNOSIS — Z170 Estrogen receptor positive status [ER+]: Secondary | ICD-10-CM | POA: Insufficient documentation

## 2017-01-29 DIAGNOSIS — Z803 Family history of malignant neoplasm of breast: Secondary | ICD-10-CM | POA: Diagnosis not present

## 2017-01-29 DIAGNOSIS — I10 Essential (primary) hypertension: Secondary | ICD-10-CM

## 2017-01-29 DIAGNOSIS — Z7982 Long term (current) use of aspirin: Secondary | ICD-10-CM | POA: Insufficient documentation

## 2017-01-29 DIAGNOSIS — Z85038 Personal history of other malignant neoplasm of large intestine: Secondary | ICD-10-CM | POA: Diagnosis not present

## 2017-01-29 DIAGNOSIS — E7800 Pure hypercholesterolemia, unspecified: Secondary | ICD-10-CM | POA: Diagnosis not present

## 2017-01-29 DIAGNOSIS — Z800 Family history of malignant neoplasm of digestive organs: Secondary | ICD-10-CM | POA: Diagnosis not present

## 2017-03-05 ENCOUNTER — Other Ambulatory Visit: Payer: Medicare Other

## 2017-04-07 ENCOUNTER — Other Ambulatory Visit: Payer: Self-pay | Admitting: *Deleted

## 2017-04-07 MED ORDER — TAMOXIFEN CITRATE 20 MG PO TABS: 20.0000 mg | ORAL_TABLET | Freq: Every day | ORAL | 1 refills | Status: DC

## 2017-08-01 NOTE — Progress Notes (Signed)
[place]  Telephone:(336) [phone] Fax:(336) [phone]  ID: Janice [name] OB: 07/09/1929  MR#: [id number]  GUY#:[id number]  Patient Care Team: Sherrin Daisy, MD as PCP - General (Family Medicine)  CHIEF COMPLAINT: Right breast DCIS, high grade.  INTERVAL HISTORY: Patient returns to clinic today for routine 6 month evaluation. She currently feels well and is asymptomatic. She has what appears to be stable angina.  She is tolerating tamoxifen without significant side effects. She has no neurologic complaints. She denies any other pain. She denies any fevers or night sweats. She has no cough, hemoptysis, or shortness of breath. She denies any nausea, vomiting, constipation, or diarrhea. She denies any melena. She has no urinary complaints. Patient offers no further specific complaints today.  REVIEW OF SYSTEMS:   Review of Systems  Constitutional: Negative.  Negative for fever, malaise/fatigue and weight loss.  HENT: Negative for congestion, sinus pain and sore throat.   Respiratory: Negative.  Negative for cough and shortness of breath.   Cardiovascular: Positive for chest pain. Negative for leg swelling.  Gastrointestinal: Negative.  Negative for blood in stool, constipation, diarrhea, melena, nausea and vomiting.  Genitourinary: Negative.  Negative for frequency, hematuria and urgency.  Musculoskeletal: Negative for neck pain.  Neurological: Negative.  Negative for tingling, sensory change, weakness and headaches.  Psychiatric/Behavioral: The patient is not nervous/anxious.     As per HPI. Otherwise, a complete review of systems is negative.  PAST MEDICAL HISTORY: Past Medical History:  Diagnosis Date  . Breast cancer ([place])    DCIS  . Colon cancer [facility]) 2003   adenocarcinoma of the ascending colon, 2007  . Environmental allergies   . High cholesterol   . Hypertension   . Large cell lymphoma ([place]) 2007   Diffuse large cell lymphoma, completed CHOP and Rituxan  in March 2004  . Non Hodgkin's lymphoma ([place]) 2003  . Skin cancer     PAST SURGICAL HISTORY: Past Surgical History:  Procedure Laterality Date  . BREAST BIOPSY Right 03/18/2016   path pending  . BREAST LUMPECTOMY WITH NEEDLE LOCALIZATION Right 04/24/2016   Procedure: BREAST LUMPECTOMY WITH NEEDLE LOCALIZATION;  Surgeon: Jules Husbands, MD;  Location: [facility];  Service: General;  Laterality: Right;  . COLON SURGERY  11/24/05   Cancer surgery  . COLONOSCOPY     2012  . COLONOSCOPY N/A 08/28/2015   Procedure: COLONOSCOPY;  Surgeon: Hulen Luster, MD;  Location: [place];  Service: Gastroenterology;  Laterality: N/A;  . DIAGNOSTIC MAMMOGRAM     May 2014  . LYMPH NODE DISSECTION  2003   neck - non-hodgkin's lymphoma  . SENTINEL NODE BIOPSY Right 04/24/2016   Procedure: SENTINEL NODE BIOPSY;  Surgeon: Jules Husbands, MD;  Location: [facility];  Service: General;  Laterality: Right;    FAMILY HISTORY Family History  Problem Relation Age of Onset  . Breast cancer Sister 71       half sister  . Cancer Other        family history of colon cancer, breast cancer, lung cancer  . Cancer Paternal Uncle        colon  . Colon cancer Maternal Uncle   . Stomach cancer Maternal Grandmother        ADVANCED DIRECTIVES:    HEALTH MAINTENANCE: Social History   Tobacco Use  . Smoking status: Never Smoker  . Smokeless tobacco: Never Used  Substance Use Topics  . Alcohol use: No  . Drug use: No  Colonoscopy:  PAP:  Bone density:  Lipid panel:  No Known Allergies  Current Outpatient Medications  Medication Sig Dispense Refill  . acetaminophen (TYLENOL) 500 MG tablet Take 500 mg by mouth every 6 (six) hours as needed.    Marland Kitchen amLODipine (NORVASC) 2.5 MG tablet Take 2.5 mg by mouth daily.    Marland Kitchen aspirin 81 MG tablet Take 81 mg by mouth daily.    Marland Kitchen atorvastatin (LIPITOR) 10 MG tablet Take 10 mg by mouth daily.    . diphenhydrAMINE (SOMINEX) 25 MG tablet Take 25 mg by mouth at bedtime  as needed for sleep.    Marland Kitchen lisinopril (PRINIVIL,ZESTRIL) 20 MG tablet Take 20 mg by mouth daily.    . tamoxifen (NOLVADEX) 20 MG tablet Take 1 tablet (20 mg total) by mouth daily. 90 tablet 1   No current facility-administered medications for this visit.     OBJECTIVE: There were no vitals filed for this visit.   There is no height or weight on file to calculate BMI.    ECOG FS:0 - Asymptomatic  General: Well-developed, well-nourished, no acute distress. Eyes: anicteric sclera. HEENT: Clear oropharynx. No palpable cervical or supraclavicular lymphadenopathy. Lungs: Clear to auscultation bilaterally. Breasts: Well-healed surgical scar on right breast. No palpable lumps or masses. Heart: Regular rate and rhythm. No rubs, murmurs, or gallops. Abdomen: Soft, nontender, nondistended. No organomegaly noted, normoactive bowel sounds. Musculoskeletal: No edema, cyanosis, or clubbing. Neuro: Alert, answering all questions appropriately. Cranial nerves grossly intact. Skin: No rashes or petechiae noted. Psych: Normal affect.   LAB RESULTS:  Lab Results  Component Value Date   NA 139 04/21/2016   K 4.0 04/21/2016   CL 102 04/21/2016   CO2 30 04/21/2016   GLUCOSE 107 (H) 04/21/2016   BUN 16 04/21/2016   CREATININE 0.83 04/21/2016   CALCIUM 9.7 04/21/2016   PROT 7.5 04/21/2016   ALBUMIN 4.0 04/21/2016   AST 20 04/21/2016   ALT 19 04/21/2016   ALKPHOS 60 04/21/2016   BILITOT 0.4 04/21/2016   GFRNONAA >60 04/21/2016   GFRAA >60 04/21/2016    Lab Results  Component Value Date   WBC 4.8 04/21/2016   NEUTROABS 2.9 04/21/2016   HGB 14.0 04/21/2016   HCT 42.5 04/21/2016   MCV 94.9 04/21/2016   PLT 209 04/21/2016     STUDIES: No results found.  ASSESSMENT: Right breast DCIS, high grade.   PLAN:    1. Right breast DCIS, high grade: Final pathology did not reveal an invasive component. Patient had her lumpectomy on April 24, 2016, but declined adjuvant XRT. Patient expressed  understanding the risks of foregoing adjuvant XRT, but given her advanced age and comorbidities it was agreed upon that this is not necessary.  Patient has been instructed to continue tamoxifen for a total 5 years completing in October 2022. Patient has a mammogram scheduled for next week. Return to clinic in 6 months for further evaluation.  2.  History of B-cell lymphoma: Patient completed her treatments in March 2004. Her most recent PET scan completed in January 2012 did not reveal any evidence of recurrence. No intervention is needed at this time. 3.  History of colon cancer: No evidence of disease. She had her resection in May 2007.  Patient's most recent colonoscopy in February 2017 was reported as negative. 4.  Angina: Patient has been instructed that if her intermittent chest pain begins to increase in frequency, duration, or intensity that she should seek medical attention immediately.  Patient expressed understanding  and was in agreement with this plan. She also understands that She can call clinic at any time with any questions, concerns, or complaints.   Lloyd Huger, MD 08/01/17 3:00 PM

## 2017-08-06 ENCOUNTER — Encounter: Payer: Self-pay | Admitting: Oncology

## 2017-08-06 ENCOUNTER — Ambulatory Visit: Payer: Medicare Other | Admitting: Oncology

## 2017-08-06 ENCOUNTER — Inpatient Hospital Stay: Payer: Medicare Other | Attending: Oncology | Admitting: Oncology

## 2017-08-06 ENCOUNTER — Other Ambulatory Visit: Payer: Self-pay

## 2017-08-06 VITALS — BP 153/71 | HR 77 | Temp 97.8°F | Resp 20 | Wt 151.2 lb

## 2017-08-06 DIAGNOSIS — Z79810 Long term (current) use of selective estrogen receptor modulators (SERMs): Secondary | ICD-10-CM | POA: Diagnosis not present

## 2017-08-06 DIAGNOSIS — D0511 Intraductal carcinoma in situ of right breast: Secondary | ICD-10-CM | POA: Insufficient documentation

## 2017-08-06 DIAGNOSIS — Z85038 Personal history of other malignant neoplasm of large intestine: Secondary | ICD-10-CM | POA: Diagnosis not present

## 2017-08-06 DIAGNOSIS — I209 Angina pectoris, unspecified: Secondary | ICD-10-CM | POA: Diagnosis not present

## 2017-08-06 DIAGNOSIS — Z8572 Personal history of non-Hodgkin lymphomas: Secondary | ICD-10-CM | POA: Insufficient documentation

## 2017-08-06 NOTE — Progress Notes (Signed)
Patient denies any concerns today.  

## 2017-08-12 ENCOUNTER — Ambulatory Visit
Admission: RE | Admit: 2017-08-12 | Discharge: 2017-08-12 | Disposition: A | Discharge status: Home / Self Care | Payer: Medicare Other | Source: Ambulatory Visit | Attending: Oncology | Admitting: Oncology

## 2017-08-12 ENCOUNTER — Other Ambulatory Visit: Payer: Self-pay | Admitting: Oncology

## 2017-08-12 DIAGNOSIS — D0511 Intraductal carcinoma in situ of right breast: Secondary | ICD-10-CM | POA: Diagnosis present

## 2017-08-12 DIAGNOSIS — Z79810 Long term (current) use of selective estrogen receptor modulators (SERMs): Secondary | ICD-10-CM | POA: Insufficient documentation

## 2017-08-12 DIAGNOSIS — R928 Other abnormal and inconclusive findings on diagnostic imaging of breast: Secondary | ICD-10-CM

## 2017-08-12 DIAGNOSIS — R921 Mammographic calcification found on diagnostic imaging of breast: Secondary | ICD-10-CM

## 2017-08-19 ENCOUNTER — Ambulatory Visit
Admission: RE | Admit: 2017-08-19 | Discharge: 2017-08-19 | Disposition: A | Discharge status: Home / Self Care | Payer: Medicare Other | Source: Ambulatory Visit | Attending: Oncology | Admitting: Oncology

## 2017-08-19 DIAGNOSIS — N6011 Diffuse cystic mastopathy of right breast: Secondary | ICD-10-CM | POA: Diagnosis not present

## 2017-08-19 DIAGNOSIS — R928 Other abnormal and inconclusive findings on diagnostic imaging of breast: Secondary | ICD-10-CM

## 2017-08-19 DIAGNOSIS — R921 Mammographic calcification found on diagnostic imaging of breast: Secondary | ICD-10-CM

## 2017-08-19 HISTORY — PX: BREAST BIOPSY: SHX20

## 2017-08-20 LAB — SURGICAL PATHOLOGY

## 2017-09-10 IMAGING — MG MM BREAST SURGICAL SPECIMEN
1 series · 1 of 1 positions shown · non-contrast
Comparison: Previous exam(s).

CLINICAL DATA: Status post lumpectomy right breast.

EXAM:
SPECIMEN RADIOGRAPH OF THE RIGHT BREAST

[R SPECIMEN]
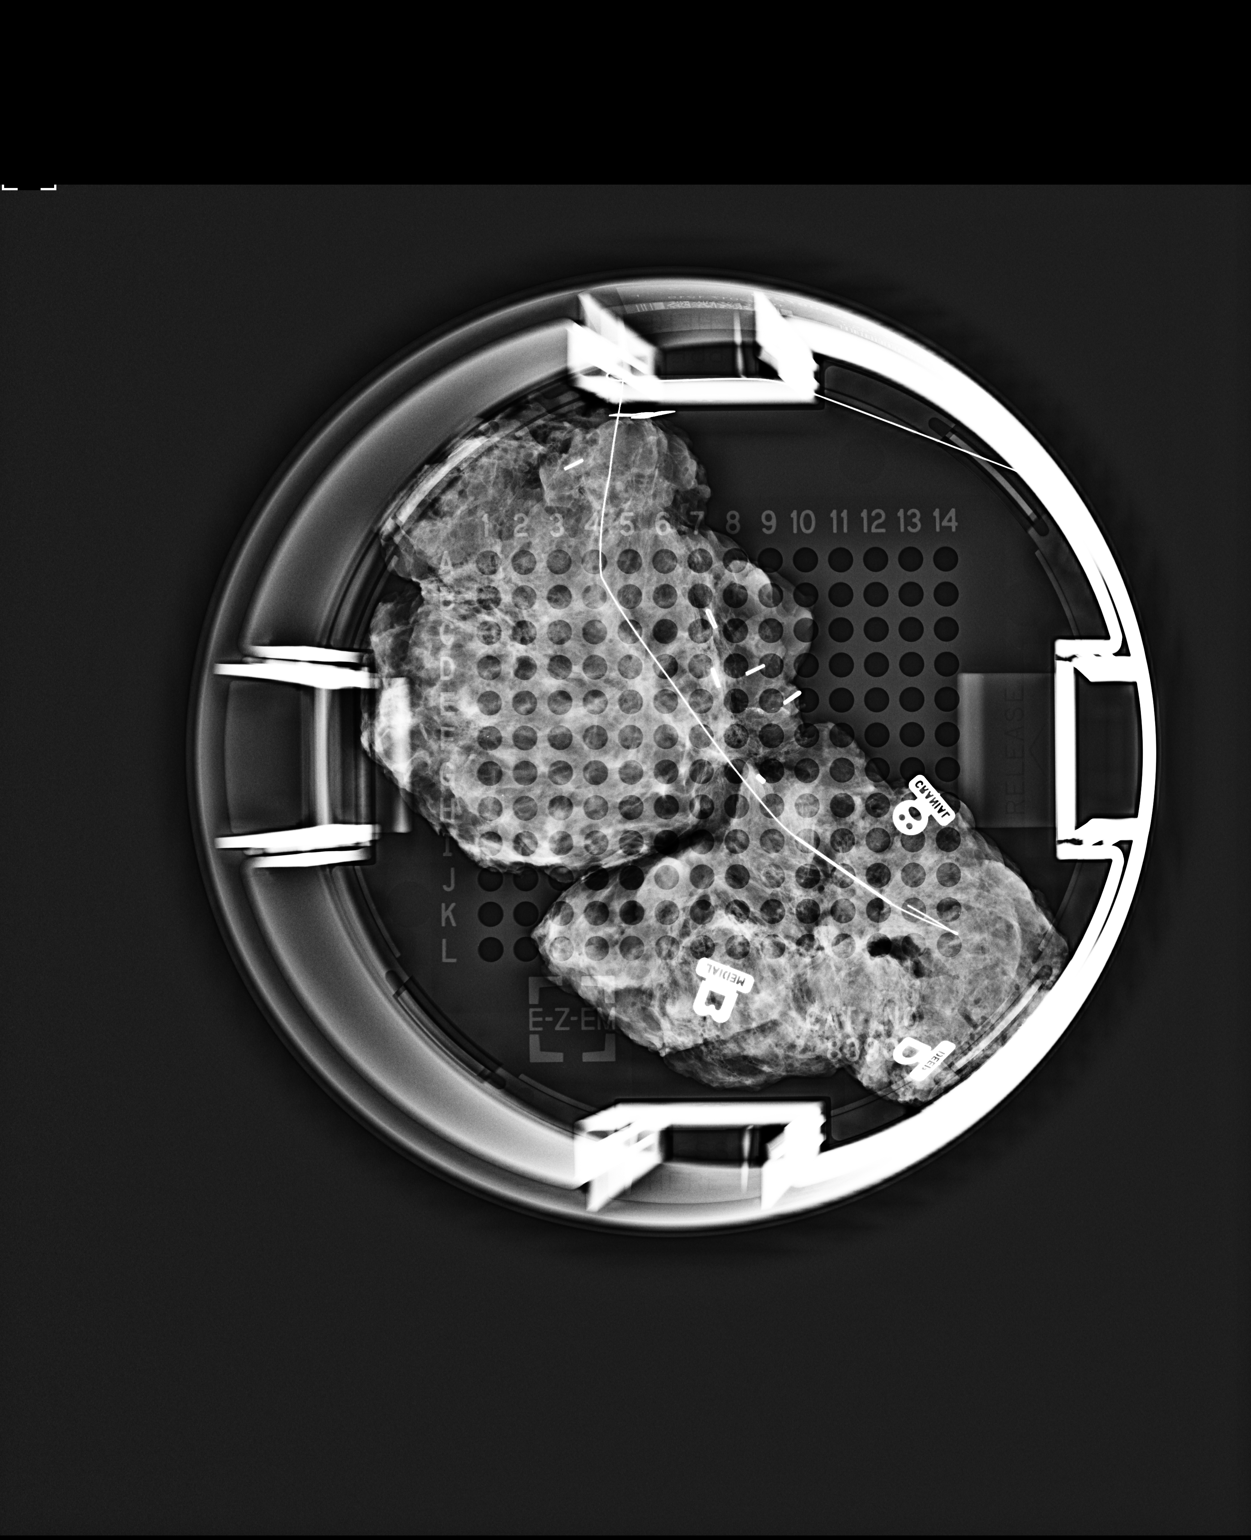

[1 of 1 positions shown; findings below may reference images not displayed]

FINDINGS: Status post excision of the right breast. The wire tip and biopsy
marker clip are present and are marked for pathology.
IMPRESSION: Specimen radiograph of the right breast.

## 2017-09-10 IMAGING — MG MM PLC BREAST LOC DEV 1ST LESION INC*R*
4 series · 4 of 4 positions shown · non-contrast
Comparison: Previous exams.

CLINICAL DATA: Patient presents for needle localization prior to
lumpectomy. Recent stereotactic guided core biopsy of right breast
calcifications reveals high-grade ductal carcinoma in situ.

EXAM:
NEEDLE LOCALIZATION OF THE RIGHT BREAST WITH MAMMO GUIDANCE

[R CC (1 of 2)]
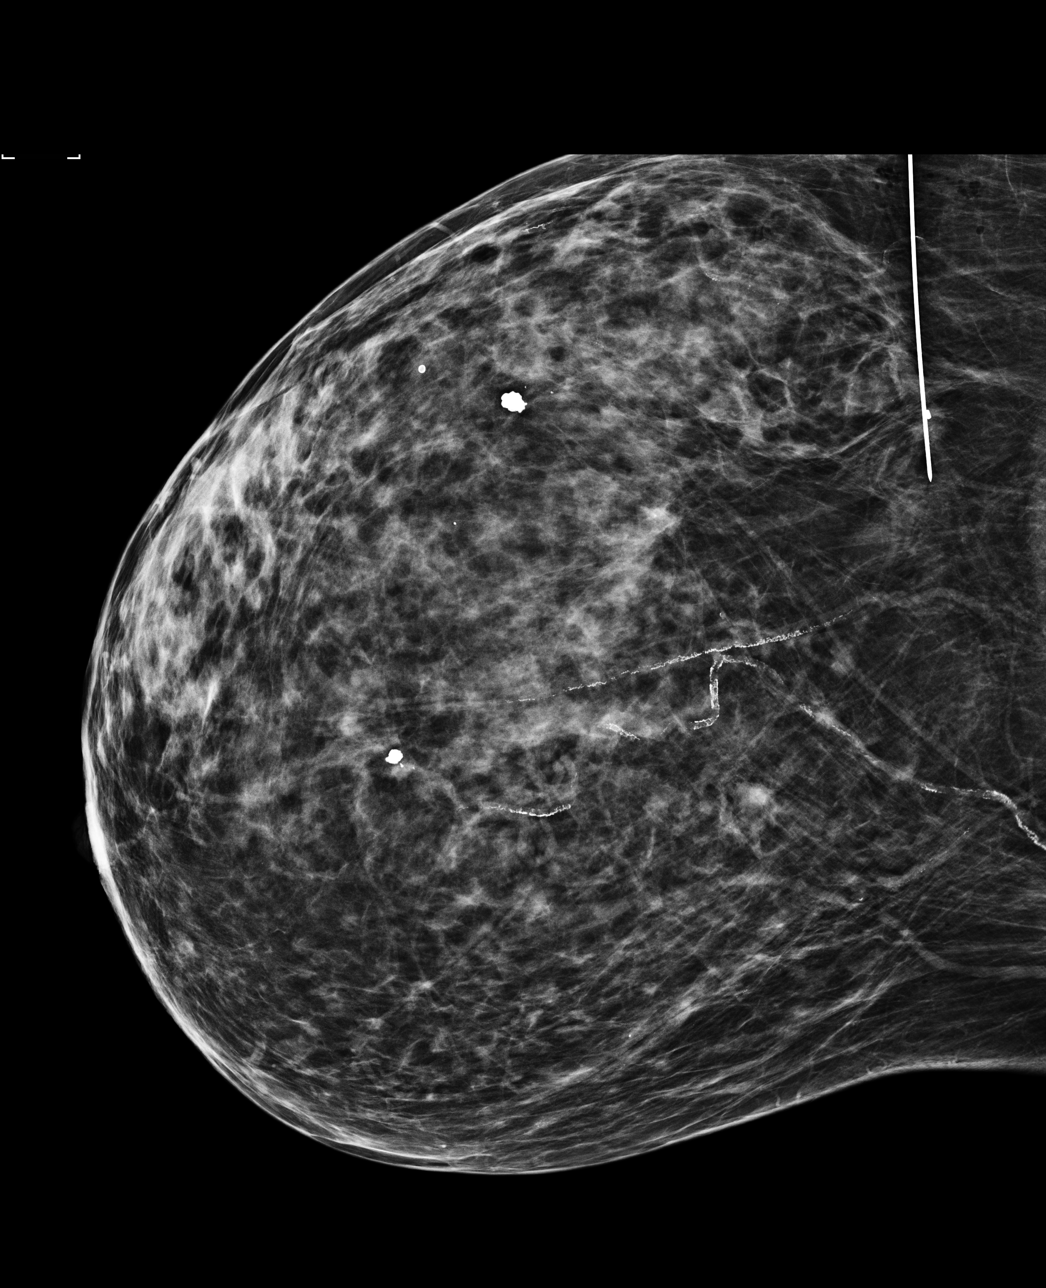

[R CC (2 of 2)]
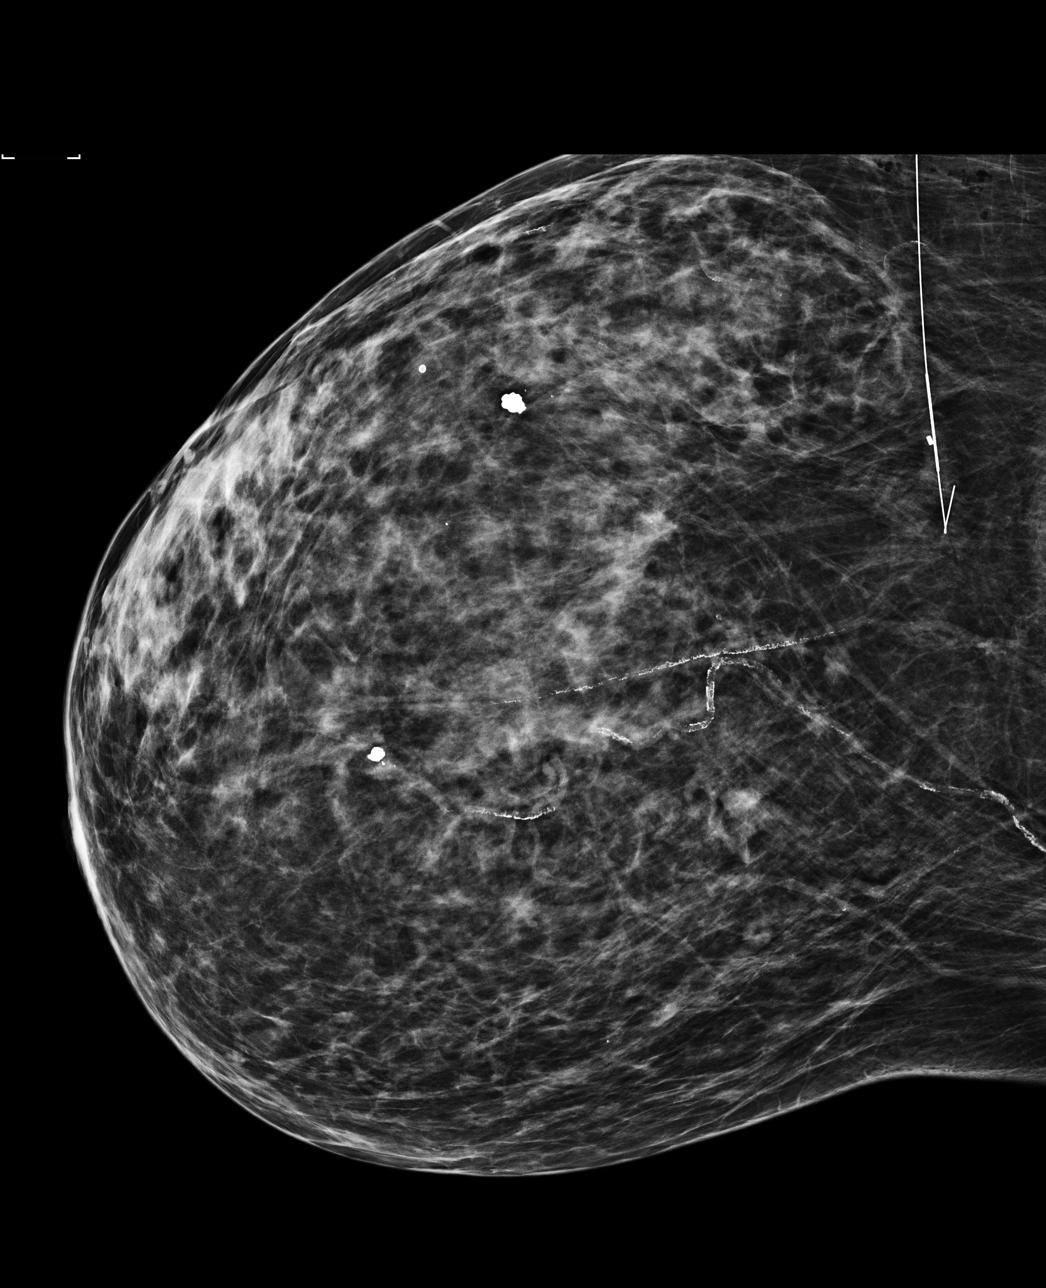

[R LM (1 of 2)]
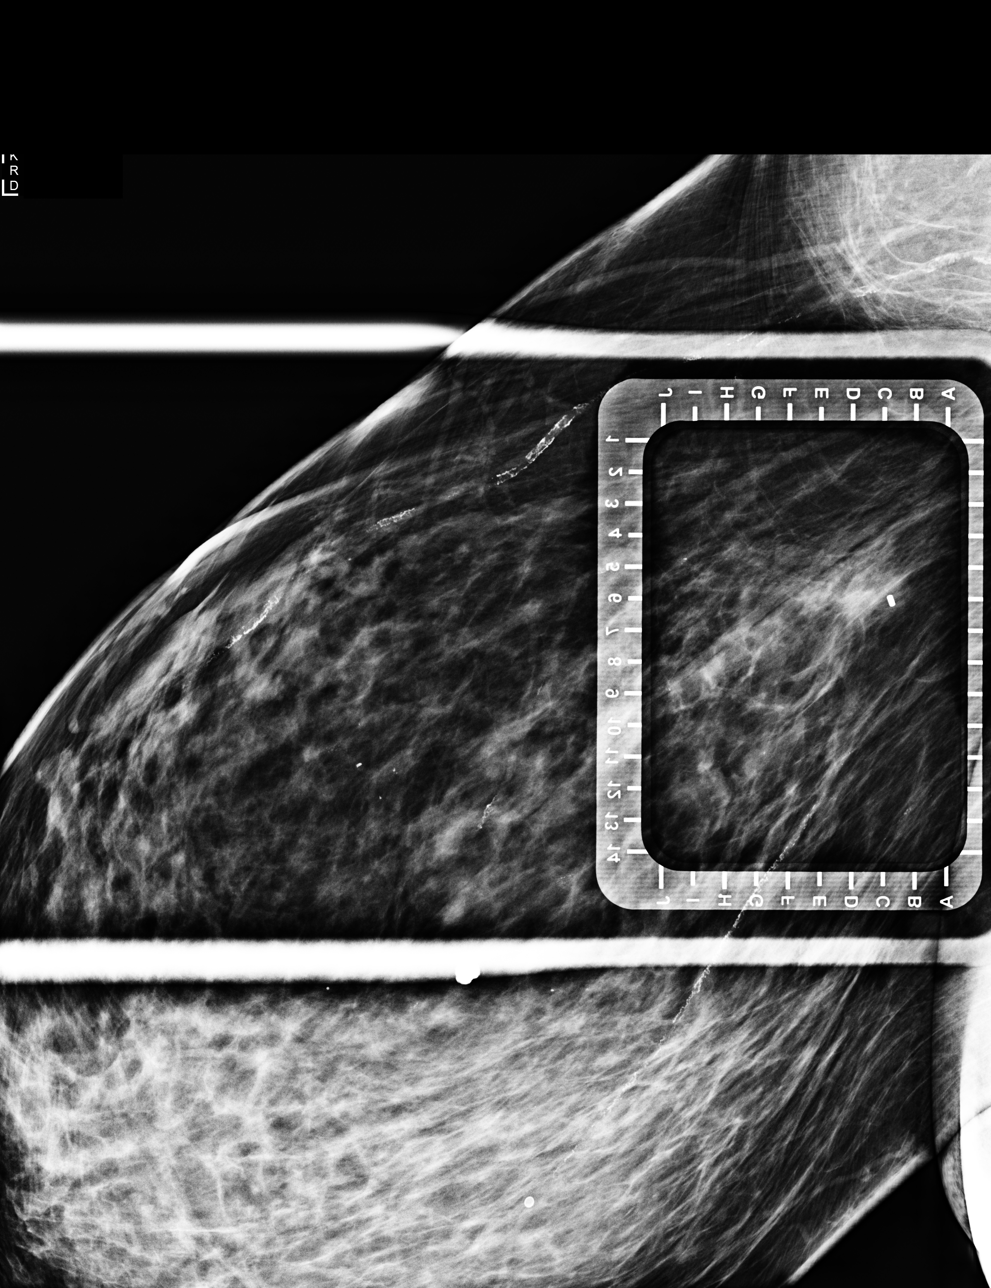

[R LM (2 of 2)]
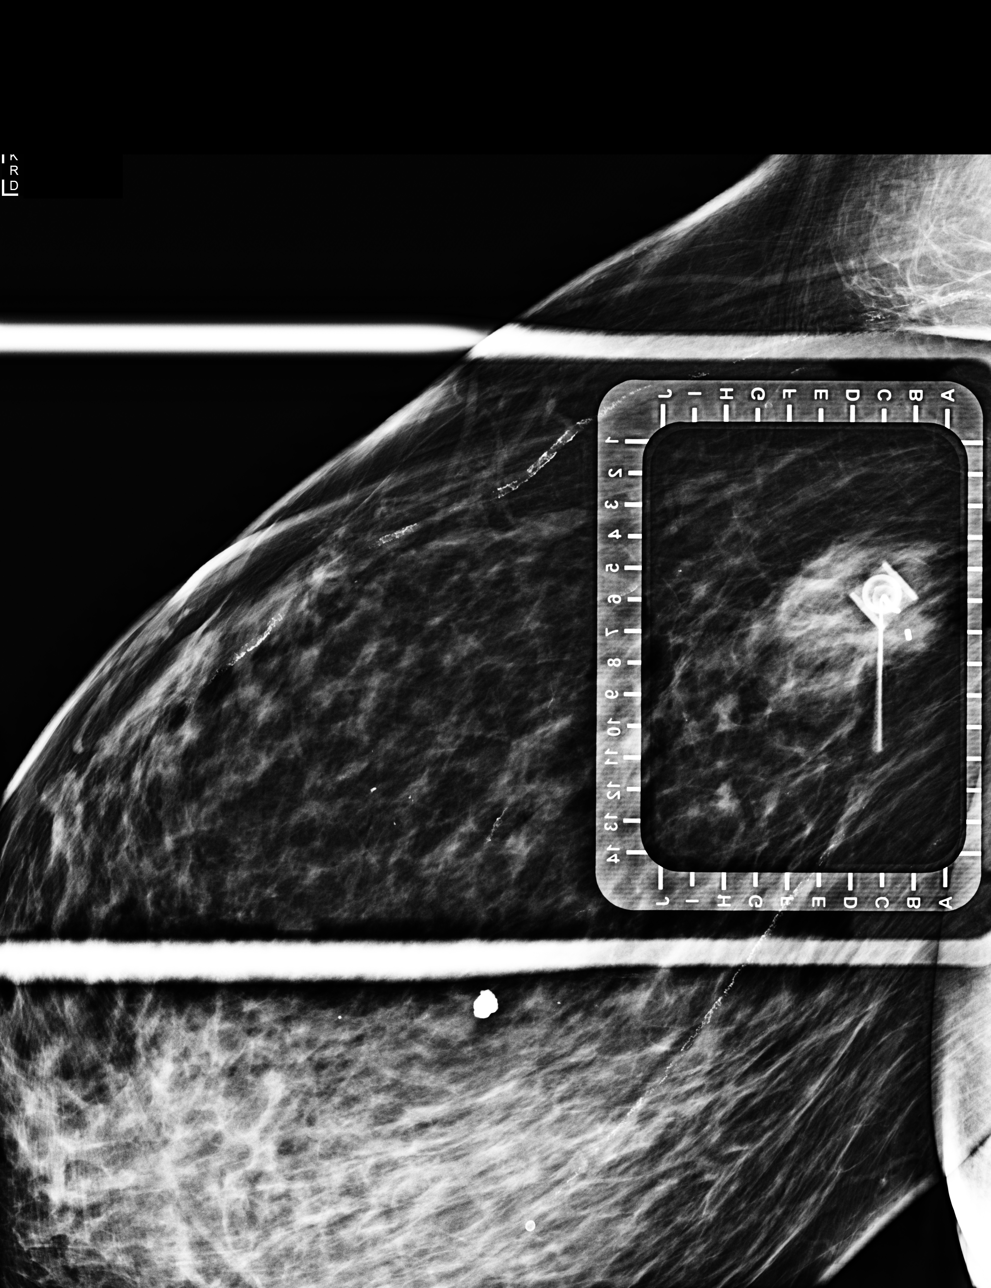

[4 of 4 positions shown; findings below may reference images not displayed]

FINDINGS: Patient presents for needle localization prior to lumpectomy. I met
with the patient and we discussed the procedure of needle
localization including benefits and alternatives. We discussed the
high likelihood of a successful procedure. We discussed the risks of
the procedure, including infection, bleeding, tissue injury, and
further surgery. Informed, written consent was given. The usual
time-out protocol was performed immediately prior to the procedure.

Using mammographic guidance, sterile technique, 1% lidocaine and a 9
cm modified Kopans needle, tissue marker clip in the lateral portion
of the right breast is localized using a lateral approach. The
images were marked for Dr. Segoviano.
IMPRESSION: Needle localization of the right breast. No apparent complications.

## 2017-12-15 ENCOUNTER — Other Ambulatory Visit: Payer: Self-pay | Admitting: *Deleted

## 2017-12-15 MED ORDER — TAMOXIFEN CITRATE 20 MG PO TABS: 20.0000 mg | ORAL_TABLET | Freq: Every day | ORAL | 1 refills | Status: DC

## 2018-02-04 ENCOUNTER — Inpatient Hospital Stay: Payer: Medicare Other | Attending: Oncology | Admitting: Nurse Practitioner

## 2018-02-04 ENCOUNTER — Encounter: Payer: Self-pay | Admitting: Nurse Practitioner

## 2018-02-04 ENCOUNTER — Inpatient Hospital Stay: Payer: Medicare Other

## 2018-02-04 ENCOUNTER — Other Ambulatory Visit: Payer: Self-pay | Admitting: *Deleted

## 2018-02-04 VITALS — BP 144/69 | HR 86 | Temp 100.0°F | Resp 20 | Wt 151.2 lb

## 2018-02-04 VITALS — Temp 97.9°F

## 2018-02-04 DIAGNOSIS — Z803 Family history of malignant neoplasm of breast: Secondary | ICD-10-CM | POA: Diagnosis not present

## 2018-02-04 DIAGNOSIS — R42 Dizziness and giddiness: Secondary | ICD-10-CM | POA: Insufficient documentation

## 2018-02-04 DIAGNOSIS — Z801 Family history of malignant neoplasm of trachea, bronchus and lung: Secondary | ICD-10-CM | POA: Diagnosis not present

## 2018-02-04 DIAGNOSIS — E860 Dehydration: Secondary | ICD-10-CM

## 2018-02-04 DIAGNOSIS — Z79811 Long term (current) use of aromatase inhibitors: Secondary | ICD-10-CM | POA: Diagnosis not present

## 2018-02-04 DIAGNOSIS — Z85828 Personal history of other malignant neoplasm of skin: Secondary | ICD-10-CM | POA: Diagnosis not present

## 2018-02-04 DIAGNOSIS — I10 Essential (primary) hypertension: Secondary | ICD-10-CM | POA: Diagnosis not present

## 2018-02-04 DIAGNOSIS — Z79899 Other long term (current) drug therapy: Secondary | ICD-10-CM | POA: Insufficient documentation

## 2018-02-04 DIAGNOSIS — Z8572 Personal history of non-Hodgkin lymphomas: Secondary | ICD-10-CM | POA: Insufficient documentation

## 2018-02-04 DIAGNOSIS — Z85038 Personal history of other malignant neoplasm of large intestine: Secondary | ICD-10-CM | POA: Insufficient documentation

## 2018-02-04 DIAGNOSIS — D0511 Intraductal carcinoma in situ of right breast: Secondary | ICD-10-CM | POA: Diagnosis present

## 2018-02-04 DIAGNOSIS — Z7982 Long term (current) use of aspirin: Secondary | ICD-10-CM | POA: Diagnosis not present

## 2018-02-04 DIAGNOSIS — Z800 Family history of malignant neoplasm of digestive organs: Secondary | ICD-10-CM

## 2018-02-04 LAB — CBC WITH DIFFERENTIAL/PLATELET
BASOS PCT: 1 %
Basophils Absolute: 0.1 10*3/uL (ref 0–0.1)
Eosinophils Absolute: 0.4 10*3/uL (ref 0–0.7)
Eosinophils Relative: 10 %
HEMATOCRIT: 37.7 % (ref 35.0–47.0)
Hemoglobin: 12.6 g/dL (ref 12.0–16.0)
Lymphocytes Relative: 20 %
Lymphs Abs: 0.9 10*3/uL — ABNORMAL LOW (ref 1.0–3.6)
MCH: 31.5 pg (ref 26.0–34.0)
MCHC: 33.4 g/dL (ref 32.0–36.0)
MCV: 94.3 fL (ref 80.0–100.0)
MONO ABS: 0.4 10*3/uL (ref 0.2–0.9)
Monocytes Relative: 9 %
NEUTROS ABS: 2.6 10*3/uL (ref 1.4–6.5)
Neutrophils Relative %: 60 %
PLATELETS: 176 10*3/uL (ref 150–440)
RBC: 4 MIL/uL (ref 3.80–5.20)
RDW: 13.9 % (ref 11.5–14.5)
WBC: 4.4 10*3/uL (ref 3.6–11.0)

## 2018-02-04 LAB — BASIC METABOLIC PANEL
ANION GAP: 11 (ref 5–15)
BUN: 16 mg/dL (ref 8–23)
CALCIUM: 8.5 mg/dL — AB (ref 8.9–10.3)
CO2: 22 mmol/L (ref 22–32)
Chloride: 106 mmol/L (ref 98–111)
Creatinine, Ser: 1.08 mg/dL — ABNORMAL HIGH (ref 0.44–1.00)
GFR, EST AFRICAN AMERICAN: 52 mL/min — AB (ref >60)
GFR, EST NON AFRICAN AMERICAN: 45 mL/min — AB (ref >60)
GLUCOSE: 115 mg/dL — AB (ref 70–99)
Potassium: 3.9 mmol/L (ref 3.5–5.1)
Sodium: 139 mmol/L (ref 135–145)

## 2018-02-04 MED ORDER — SODIUM CHLORIDE 0.9 % IV SOLN
Freq: Once | INTRAVENOUS | Status: AC
  Administered 2018-02-04: 15:00:00 via INTRAVENOUS
  Filled 2018-02-04: qty 1000

## 2018-02-04 MED ORDER — ACETAMINOPHEN 325 MG PO TABS
650.0000 mg | ORAL_TABLET | Freq: Once | ORAL | Status: AC
  Administered 2018-02-04: 650 mg via ORAL
  Filled 2018-02-04: qty 2

## 2018-02-04 NOTE — Progress Notes (Signed)
Patient reports dizziness over the past week.

## 2018-02-04 NOTE — Patient Instructions (Signed)
Dehydration, Adult Dehydration is when there is not enough fluid or water in your body. This happens when you lose more fluids than you take in. Dehydration can range from mild to very bad. It should be treated right away to keep it from getting very bad. Symptoms of mild dehydration may include:  Thirst.  Dry lips.  Slightly dry mouth.  Dry, warm skin.  Dizziness. Symptoms of moderate dehydration may include:  Very dry mouth.  Muscle cramps.  Dark pee (urine). Pee may be the color of tea.  Your body making less pee.  Your eyes making fewer tears.  Heartbeat that is uneven or faster than normal (palpitations).  Headache.  Light-headedness, especially when you stand up from sitting.  Fainting (syncope). Symptoms of very bad dehydration may include:  Changes in skin, such as: ? Cold and clammy skin. ? Blotchy (mottled) or pale skin. ? Skin that does not quickly return to normal after being lightly pinched and let go (poor skin turgor).  Changes in body fluids, such as: ? Feeling very thirsty. ? Your eyes making fewer tears. ? Not sweating when body temperature is high, such as in hot weather. ? Your body making very little pee.  Changes in vital signs, such as: ? Weak pulse. ? Pulse that is more than 100 beats a minute when you are sitting still. ? Fast breathing. ? Low blood pressure.  Other changes, such as: ? Sunken eyes. ? Cold hands and feet. ? Confusion. ? Lack of energy (lethargy). ? Trouble waking up from sleep. ? Short-term weight loss. ? Unconsciousness. Follow these instructions at home:  If told by your doctor, drink an ORS: ? Make an ORS by using instructions on the package. ? Start by drinking small amounts, about  cup (120 mL) every 5-10 minutes. ? Slowly drink more until you have had the amount that your doctor said to have.  Drink enough clear fluid to keep your pee clear or pale yellow. If you were told to drink an ORS, finish the ORS  first, then start slowly drinking clear fluids. Drink fluids such as: ? Water. Do not drink only water by itself. Doing that can make the salt (sodium) level in your body get too low (hyponatremia). ? Ice chips. ? Fruit juice that you have added water to (diluted). ? Low-calorie sports drinks.  Avoid: ? Alcohol. ? Drinks that have a lot of sugar. These include high-calorie sports drinks, fruit juice that does not have water added, and soda. ? Caffeine. ? Foods that are greasy or have a lot of fat or sugar.  Take over-the-counter and prescription medicines only as told by your doctor.  Do not take salt tablets. Doing that can make the salt level in your body get too high (hypernatremia).  Eat foods that have minerals (electrolytes). Examples include bananas, oranges, potatoes, tomatoes, and spinach.  Keep all follow-up visits as told by your doctor. This is important. Contact a doctor if:  You have belly (abdominal) pain that: ? Gets worse. ? Stays in one area (localizes).  You have a rash.  You have a stiff neck.  You get angry or annoyed more easily than normal (irritability).  You are more sleepy than normal.  You have a harder time waking up than normal.  You feel: ? Weak. ? Dizzy. ? Very thirsty.  You have peed (urinated) only a small amount of very dark pee during 6-8 hours. Get help right away if:  You have symptoms of   very bad dehydration.  You cannot drink fluids without throwing up (vomiting).  Your symptoms get worse with treatment.  You have a fever.  You have a very bad headache.  You are throwing up or having watery poop (diarrhea) and it: ? Gets worse. ? Does not go away.  You have blood or something green (bile) in your throw-up.  You have blood in your poop (stool). This may cause poop to look black and tarry.  You have not peed in 6-8 hours.  You pass out (faint).  Your heart rate when you are sitting still is more than 100 beats a  minute.  You have trouble breathing. This information is not intended to replace advice given to you by your health care provider. Make sure you discuss any questions you have with your health care provider. Document Released: 05/02/2009 Document Revised: 01/24/2016 Document Reviewed: 08/30/2015 Elsevier Interactive Patient Education  2018 Elsevier Inc.  

## 2018-02-04 NOTE — Patient Instructions (Signed)
Dizziness Dizziness is a common problem. It makes you feel unsteady or light-headed. You may feel like you are about to pass out (faint). Dizziness can lead to getting hurt if you stumble or fall. Dizziness can be caused by many things, including:  Medicines.  Not having enough water in your body (dehydration).  Illness.  Follow these instructions at home: Eating and drinking  Drink enough fluid to keep your pee (urine) clear or pale yellow. This helps to keep you from getting dehydrated. Try to drink more clear fluids, such as water.  Do not drink alcohol.  Limit how much caffeine you drink or eat, if your doctor tells you to do that.  Limit how much salt (sodium) you drink or eat, if your doctor tells you to do that. Activity  Avoid making quick movements. ? When you stand up from sitting in a chair, steady yourself until you feel okay. ? In the morning, first sit up on the side of the bed. When you feel okay, stand slowly while you hold onto something. Do this until you know that your balance is fine.  If you need to stand in one place for a long time, move your legs often. Tighten and relax the muscles in your legs while you are standing.  Do not drive or use heavy machinery if you feel dizzy.  Avoid bending down if you feel dizzy. Place items in your home so you can reach them easily without leaning over. Lifestyle  Do not use any products that contain nicotine or tobacco, such as cigarettes and e-cigarettes. If you need help quitting, ask your doctor.  Try to lower your stress level. You can do this by using methods such as yoga or meditation. Talk with your doctor if you need help. General instructions  Watch your dizziness for any changes.  Take over-the-counter and prescription medicines only as told by your doctor. Talk with your doctor if you think that you are dizzy because of a medicine that you are taking.  Tell a friend or a family member that you are feeling  dizzy. If he or she notices any changes in your behavior, have this person call your doctor.  Keep all follow-up visits as told by your doctor. This is important. Contact a doctor if:  Your dizziness does not go away.  Your dizziness or light-headedness gets worse.  You feel sick to your stomach (nauseous).  You have trouble hearing.  You have new symptoms.  You are unsteady on your feet.  You feel like the room is spinning. Get help right away if:  You throw up (vomit) or have watery poop (diarrhea), and you cannot eat or drink anything.  You have trouble: ? Talking. ? Walking. ? Swallowing. ? Using your arms, hands, or legs.  You feel generally weak.  You are not thinking clearly, or you have trouble forming sentences. A friend or family member may notice this.  You have: ? Chest pain. ? Pain in your belly (abdomen). ? Shortness of breath. ? Sweating.  Your vision changes.  You are bleeding.  You have a very bad headache.  You have neck pain or a stiff neck.  You have a fever. These symptoms may be an emergency. Do not wait to see if the symptoms will go away. Get medical help right away. Call your local emergency services (911 in the [place] Do not drive yourself to the hospital. Summary  Dizziness makes you feel unsteady or light-headed. You may  feel like you are about to pass out (faint).  Drink enough fluid to keep your pee (urine) clear or pale yellow. Do not drink alcohol.  Avoid making quick movements if you feel dizzy.  Watch your dizziness for any changes. This information is not intended to replace advice given to you by your health care provider. Make sure you discuss any questions you have with your health care provider. Document Released: 06/25/2011 Document Revised: 07/23/2016 Document Reviewed: 07/23/2016 Elsevier Interactive Patient Education  2017 [place].  Vertigo Vertigo means that you feel like you are moving when you are not.  Vertigo can also make you feel like things around you are moving when they are not. This feeling can come and go at any time. Vertigo often goes away on its own. Follow these instructions at home:  Avoid making fast movements.  Avoid driving.  Avoid using heavy machinery.  Avoid doing any task or activity that might cause danger to you or other people if you would have a vertigo attack while you are doing it.  Sit down right away if you feel dizzy or have trouble with your balance.  Take over-the-counter and prescription medicines only as told by your doctor.  Follow instructions from your doctor about which positions or movements you should avoid.  Drink enough fluid to keep your pee (urine) clear or pale yellow.  Keep all follow-up visits as told by your doctor. This is important. Contact a doctor if:  Medicine does not help your vertigo.  You have a fever.  Your problems get worse or you have new symptoms.  Your family or friends see changes in your behavior.  You feel sick to your stomach (nauseous) or you throw up (vomit).  You have a "pins and needles" feeling or you are numb in part of your body. Get help right away if:  You have trouble moving or talking.  You are always dizzy.  You pass out (faint).  You get very bad headaches.  You feel weak or have trouble using your hands, arms, or legs.  You have changes in your hearing.  You have changes in your seeing (vision).  You get a stiff neck.  Bright light starts to bother you. This information is not intended to replace advice given to you by your health care provider. Make sure you discuss any questions you have with your health care provider. Document Released: 04/14/2008 Document Revised: 12/12/2015 Document Reviewed: 10/29/2014 Elsevier Interactive Patient Education  2018 [place] tablets or capsules What is this medicine? MECLIZINE (MEK li zeen) is an antihistamine. It is used to  prevent nausea, vomiting, or dizziness caused by motion sickness. It is also used to prevent and treat vertigo (extreme dizziness or a feeling that you or your surroundings are tilting or spinning around). This medicine may be used for other purposes; ask your health care provider or pharmacist if you have questions. COMMON BRAND NAME(S): Antivert, Dramamine Less Drowsy, Medivert, Meni-D What should I tell my health care provider before I take this medicine? They need to know if you have any of these conditions: -glaucoma -lung or breathing disease, like asthma -problems urinating -prostate disease -stomach or intestine problems -an unusual or allergic reaction to meclizine, other medicines, foods, dyes, or preservatives -pregnant or trying to get pregnant -breast-feeding How should I use this medicine? Take this medicine by mouth with a glass of water. Follow the directions on the prescription label. If you are using this medicine to  prevent motion sickness, take the dose at least 1 hour before travel. If it upsets your stomach, take it with food or milk. Take your doses at regular intervals. Do not take your medicine more often than directed. Talk to your pediatrician regarding the use of this medicine in children. Special care may be needed. Overdosage: If you think you have taken too much of this medicine contact a poison control center or emergency room at once. NOTE: This medicine is only for you. Do not share this medicine with others. What if I miss a dose? If you miss a dose, take it as soon as you can. If it is almost time for your next dose, take only that dose. Do not take double or extra doses. What may interact with this medicine? Do not take this medicine with any of the following medications: -MAOIs like Carbex, Eldepryl, Marplan, Nardil, and Parnate This medicine may also interact with the following medications: -alcohol -antihistamines for allergy, cough and cold -certain  medicines for anxiety or sleep -certain medicines for depression, like amitriptyline, fluoxetine, sertraline -certain medicines for seizures like phenobarbital, primidone -general anesthetics like halothane, isoflurane, methoxyflurane, propofol -local anesthetics like lidocaine, pramoxine, tetracaine -medicines that relax muscles for surgery -narcotic medicines for pain -phenothiazines like chlorpromazine, mesoridazine, prochlorperazine, thioridazine This list may not describe all possible interactions. Give your health care provider a list of all the medicines, herbs, non-prescription drugs, or dietary supplements you use. Also tell them if you smoke, drink alcohol, or use illegal drugs. Some items may interact with your medicine. What should I watch for while using this medicine? Tell your doctor or healthcare professional if your symptoms do not start to get better or if they get worse. You may get drowsy or dizzy. Do not drive, use machinery, or do anything that needs mental alertness until you know how this medicine affects you. Do not stand or sit up quickly, especially if you are an older patient. This reduces the risk of dizzy or fainting spells. Alcohol may interfere with the effect of this medicine. Avoid alcoholic drinks. Your mouth may get dry. Chewing sugarless gum or sucking hard candy, and drinking plenty of water may help. Contact your doctor if the problem does not go away or is severe. This medicine may cause dry eyes and blurred vision. If you wear contact lenses you may feel some discomfort. Lubricating drops may help. See your eye doctor if the problem does not go away or is severe. What side effects may I notice from receiving this medicine? Side effects that you should report to your doctor or health care professional as soon as possible: -feeling faint or lightheaded, falls -fast, irregular heartbeat Side effects that usually do not require medical attention (report to your  doctor or health care professional if they continue or are bothersome): -constipation -headache -trouble passing urine or change in the amount of urine -trouble sleeping -upset stomach This list may not describe all possible side effects. Call your doctor for medical advice about side effects. You may report side effects to FDA at 1-800-FDA-1088. Where should I keep my medicine? Keep out of the reach of children. Store at room temperature between 15 and 30 degrees C (59 and 86 degrees F). Keep container tightly closed. Throw away any unused medicine after the expiration date. NOTE: This sheet is a summary. It may not cover all possible information. If you have questions about this medicine, talk to your doctor, pharmacist, or health care provider.  2018  Elsevier/Gold Standard (2015-08-07 19:41:02)

## 2018-02-04 NOTE — Progress Notes (Signed)
Temperature was elevated when patient was seen by NP.  Tylenol was ordered and given.  Prior to giving the Tylenol, temperature was normal at 97.9.  NP notified.

## 2018-02-04 NOTE — Progress Notes (Signed)
[facility]  [phone] [phone]  ID: Janice [name] OB: [birth year]  MR#: [id number]  RJJ#:[id number]  Patient Care Team: Sherrin Daisy, MD as PCP - General (Family Medicine)  CHIEF COMPLAINT: Right breast DCIS, high grade.  INTERVAL HISTORY: Patient returns to clinic today for routine six-month evaluation.  She complains of acute and worsening dizziness.  She feels that symptoms started over past weeks to months and have progressively worsened.  She says this happens worse when changing position but has resulted in several falls without injury.  She says that she has discussed this with her primary care doctor but has not had previous work-up.   She continues tamoxifen for DCIS. She denies any lumps or bumps but admits that she does not perform self breast exams at home regularly.  She states that she has "lumpy" breasts.  Denies fever, flashes, night sweats, or weight loss.  She denies pain.  Denies cough, hemoptysis, shortness of breath.  She denies any nausea, vomiting, constipation or diarrhea.  She denies urinary complaints.  She denies melena.  She offers no further specific complaints today.   REVIEW OF SYSTEMS:   Review of Systems  Constitutional: Negative.  Negative for fever, malaise/fatigue and weight loss.  HENT: Negative for congestion, sinus pain and sore throat.   Respiratory: Negative.  Negative for cough and shortness of breath.   Cardiovascular: Negative for chest pain, palpitations, orthopnea, claudication and leg swelling.  Gastrointestinal: Negative.  Negative for blood in stool, constipation, diarrhea, melena, nausea and vomiting.  Genitourinary: Negative.  Negative for frequency, hematuria and urgency.  Musculoskeletal: Positive for falls. Negative for neck pain.  Neurological: Positive for dizziness and weakness. Negative for tingling, sensory change and headaches.  Psychiatric/Behavioral: The patient is not nervous/anxious.      As per HPI. Otherwise, a complete review of systems is negative.  PAST MEDICAL HISTORY: Past Medical History:  Diagnosis Date  . Breast cancer ([place]) 2017   DCIS of right breast  . Colon cancer ([place]) 2003   adenocarcinoma of the ascending colon, 2007  . Environmental allergies   . High cholesterol   . Hypertension   . Large cell lymphoma ([place]) 2007   Diffuse large cell lymphoma, completed CHOP and Rituxan in March 2004  . Non Hodgkin's lymphoma ([place]) 2003  . Skin cancer     PAST SURGICAL HISTORY: Past Surgical History:  Procedure Laterality Date  . BREAST BIOPSY Right 03/18/2016   DCIS  . BREAST BIOPSY Right 08/19/2017   ribbon marker, path pending, UOQ  . BREAST LUMPECTOMY Right 04/24/2016   DCIS clear margins. SN biopsy-negative. Declined rad tx.   Marland Kitchen BREAST LUMPECTOMY WITH NEEDLE LOCALIZATION Right 04/24/2016   Procedure: BREAST LUMPECTOMY WITH NEEDLE LOCALIZATION;  Surgeon: Jules Husbands, MD;  Location: [facility];  Service: General;  Laterality: Right;  . COLON SURGERY  11/24/05   Cancer surgery  . COLONOSCOPY     2012  . COLONOSCOPY N/A 08/28/2015   Procedure: COLONOSCOPY;  Surgeon: Hulen Luster, MD;  Location: [facility];  Service: Gastroenterology;  Laterality: N/A;  . DIAGNOSTIC MAMMOGRAM     May 2014  . LYMPH NODE DISSECTION  2003   neck - non-hodgkin's lymphoma  . SENTINEL NODE BIOPSY Right 04/24/2016   Procedure: SENTINEL NODE BIOPSY;  Surgeon: Jules Husbands, MD;  Location: [facility];  Service: General;  Laterality: Right;    FAMILY HISTORY Family History  Problem Relation Age of Onset  . Breast cancer  Sister 9       half sister  . Cancer Other        family history of colon cancer, breast cancer, lung cancer  . Cancer Paternal Uncle        colon  . Colon cancer Maternal Uncle   . Stomach cancer Maternal Grandmother     ADVANCED DIRECTIVES:   HEALTH MAINTENANCE: Social History   Tobacco Use  . Smoking status: Never Smoker  . Smokeless tobacco:  Never Used  Substance Use Topics  . Alcohol use: No  . Drug use: No    Colonoscopy:  PAP:  Bone density:  Lipid panel:  No Known Allergies  Current Outpatient Medications  Medication Sig Dispense Refill  . acetaminophen (TYLENOL) 500 MG tablet Take 500 mg by mouth every 6 (six) hours as needed.    Marland Kitchen amLODipine (NORVASC) 2.5 MG tablet Take 2.5 mg by mouth daily.    Marland Kitchen aspirin 81 MG tablet Take 81 mg by mouth daily.    Marland Kitchen atorvastatin (LIPITOR) 10 MG tablet Take 10 mg by mouth daily.    . diphenhydrAMINE (SOMINEX) 25 MG tablet Take 25 mg by mouth at bedtime as needed for sleep.    Marland Kitchen lisinopril (PRINIVIL,ZESTRIL) 20 MG tablet Take 20 mg by mouth daily.    . tamoxifen (NOLVADEX) 20 MG tablet Take 1 tablet (20 mg total) by mouth daily. 90 tablet 1   No current facility-administered medications for this visit.    Facility-Administered Medications Ordered in Other Visits  Medication Dose Route Frequency Provider Last Rate Last Dose  . 0.9 %  sodium chloride infusion   Intravenous Once Verlon Au, NP      . acetaminophen (TYLENOL) tablet 650 mg  650 mg Oral Once Verlon Au, NP        OBJECTIVE: Vitals:   02/04/18 1429  BP: (!) 144/69  Pulse: 86  Resp: 20  Temp: 100 F (37.8 C)     Body mass index is 26.79 kg/m.    ECOG FS:0 - Asymptomatic  General: frail, elderly female. Alone.  Eyes: anicteric sclera. HEENT: Clear oropharynx. No palpable cervical or supraclavicular lymphadenopathy. Lungs: Clear to auscultation bilaterally. Breasts: exam deferred d/t mobility Heart: Regular rate and rhythm. No rubs, murmurs, or gallops. Abdomen: Soft, nontender, nondistended. No organomegaly noted, normoactive bowel sounds. Musculoskeletal: No edema, cyanosis, or clubbing. Cane for ambulation. Markedly slow gait.  Neuro: Alert, answering all questions appropriately. Cranial nerves grossly intact.  Skin: No rashes or petechiae noted. Psych: Normal affect.  LAB RESULTS:  Lab  Results  Component Value Date   NA 139 04/21/2016   K 4.0 04/21/2016   CL 102 04/21/2016   CO2 30 04/21/2016   GLUCOSE 107 (H) 04/21/2016   BUN 16 04/21/2016   CREATININE 0.83 04/21/2016   CALCIUM 9.7 04/21/2016   PROT 7.5 04/21/2016   ALBUMIN 4.0 04/21/2016   AST 20 04/21/2016   ALT 19 04/21/2016   ALKPHOS 60 04/21/2016   BILITOT 0.4 04/21/2016   GFRNONAA >60 04/21/2016   GFRAA >60 04/21/2016    Lab Results  Component Value Date   WBC 4.8 04/21/2016   NEUTROABS 2.9 04/21/2016   HGB 14.0 04/21/2016   HCT 42.5 04/21/2016   MCV 94.9 04/21/2016   PLT 209 04/21/2016    STUDIES: No results found.   ASSESSMENT: Right breast DCIS, high grade.   PLAN:    1. Right breast DCIS, high grade: Final pathology did not reveal an invasive component.  Patient had her lumpectomy on April 24, 2016, but declined adjuvant XRT. Patient expressed understanding the risks of foregoing adjuvant XRT, but given her advanced age and comorbidities it was agreed upon that this was not necessary.   08/12/2017 bilateral mammogram revealed 5 mm group of indeterminate calcifications in the upper outer quadrant of the right breast.  Differential considerations include fibrocystic changes and ductal carcinoma in situ.  She underwent stereotactic guided core needle biopsy on 08/19/2017.  All the calcifications were removed with the biopsy.  Pathology of right breast biopsy revealed a.  Right breast, posterior upper outer quadrant: Stereotactic biopsy: Columnar cell change and fibrocystic change.  Microcalcifications present in benign breast tissue.  Recommendation: Bilateral diagnostic mammogram in 1 year. Continue Tamoxifen with plans for total of 5 years of therapy, completing in October 2022.   2.  History of B-cell lymphoma: Patient completed her treatments in March 2004. Her most recent PET scan completed in January 2012 did not reveal any evidence of recurrence. Asymptomatic today. No intervention is needed  at this time.  3.  History of colon cancer: She had her resection in May 2007. Patient's most recent colonoscopy in February 2017 was reported as negative. Asymptomatic today.    4.  Dizziness: etiology unclear but based on presentation suspect positional vs dehydration vs medication side effect. Will check BMP and CBC in clinic today. 500 cc NaCl IV and tylenol 650mg  PO given in clinic today. Patient advised to increase fluid intake and consider meclizine for symptoms. Fall preventions measures discussed at length. Also advised patient that I do not recommend she drive when she is symptomatic and given increased frailty I have concerns of her driving at all.    Follow up with Primary care Provider for continued management of dizziness. Follow up with Dr. Grayland Ormond in 08/2017 after next mammogram.    Patient expressed understanding and was in agreement with this plan. She also understands that She can call clinic at any time with any questions, concerns, or complaints.    Beckey Rutter, DNP, AGNP-C [place] [facility] [phone] (work cell) [phone] (office) 02/04/18 3:19 PM

## 2018-02-07 ENCOUNTER — Other Ambulatory Visit: Payer: Self-pay | Admitting: Nurse Practitioner

## 2018-02-07 DIAGNOSIS — D0511 Intraductal carcinoma in situ of right breast: Secondary | ICD-10-CM

## 2018-08-08 ENCOUNTER — Other Ambulatory Visit: Payer: Self-pay | Admitting: *Deleted

## 2018-08-08 MED ORDER — TAMOXIFEN CITRATE 20 MG PO TABS: 20.0000 mg | ORAL_TABLET | Freq: Every day | ORAL | 1 refills | Status: DC

## 2018-08-15 ENCOUNTER — Ambulatory Visit
Admission: RE | Admit: 2018-08-15 | Discharge: 2018-08-15 | Disposition: A | Discharge status: Home / Self Care | Payer: Medicare Other | Source: Ambulatory Visit | Attending: Nurse Practitioner | Admitting: Nurse Practitioner

## 2018-08-15 DIAGNOSIS — D0511 Intraductal carcinoma in situ of right breast: Secondary | ICD-10-CM

## 2018-08-15 HISTORY — DX: Personal history of antineoplastic chemotherapy: Z92.21

## 2018-08-18 ENCOUNTER — Telehealth: Payer: Self-pay | Admitting: Nurse Practitioner

## 2018-08-18 NOTE — Telephone Encounter (Signed)
Called patient and shared results of mammogram. Follow up as scheduled with Dr. Grayland Ormond in July.

## 2019-02-02 ENCOUNTER — Other Ambulatory Visit: Payer: Self-pay

## 2019-02-02 DIAGNOSIS — N6310 Unspecified lump in the right breast, unspecified quadrant: Secondary | ICD-10-CM

## 2019-02-02 DIAGNOSIS — D0511 Intraductal carcinoma in situ of right breast: Secondary | ICD-10-CM

## 2019-02-02 NOTE — Progress Notes (Signed)
[facility]  [address] [place], [place] [address] Phone: [phone]  Fax: [phone]   Clinic Day:  02/03/2019  Referring physician: Sherrin Daisy, MD  Chief Complaint: Janice [name] is a 85 y.o. female with high grade right breast DCIS who is seen for new patient assessment  HPI: The patient was diagnosed with right breast DCIS in 2017.  Routine screening mammogram on 02/05/2016 revealed calcifications in the right breast. Right diagnostic mammogram on 02/27/2016 showed suspicious calcifications in the right breast spanning 7m. Biopsy on 03/18/2016 showed high grade DCIS.   Breast MRI on 04/16/2016 showed linear non mass enhancement extending from the skin surface of the upper-outer quadrant to the biopsy marking clip in the upper-outer quadrant and continues 1.5 cm posterior to the clip. There was no MRI evidence of left breast malignancy.  She underwent a right lumpectomy on 04/24/2016 by Dr. DCaroleen Hamman  Pathology revealed no residual DCIS, focal atypical lobular hyperplasia and columnar cell changes. Two right axillary lymph nodes were negative for malignancy.   She elected not to undergo adjuvant radiation therapy due to multiple comorbidities. She began tamoxifen on 05/11/2016 with a planned duration of 5 years.   She has a history of stage III non-Hodgkin's lymphoma. She completed chemotherapy treatment in 2004. She did not undergo radiation. Last PET scan on 07/23/2010 revealed no evidence of malignancy.   She has a history of colon cancer, detected on routine colonoscopy. She was asymptomatic. She underwent a resection in 11/2005. She did not undergo chemotherapy. Last colonoscopy on 08/28/2015 revealed one small polyp in the sigmoid colon in the distal sigmoid colon. There was diverticulosis in the sigmoid colon and in the descending colon. The examination was otherwise normal. Pathology showed a hyperplastic polyp, negative for dysplasia  or malignancy.   She was followed originally by Dr. COliva Bustardand then by Dr. FGrayland Ormondfor her history of B-cell lymphoma, colon cancer, and right breast DCIS.  Notes reviewed.   The patient was last seen in the medical oncology clinic on 02/04/2018 by LBeckey Rutter NP. At that time, she complained of acute and worsening dizziness that had developed over the previous few weeks. Symptoms occurred when changing positions and had resulted in several falls without injury. She continued on tamoxifen. Breast exam was deferred due to mobility.   Bilateral diagnostic mammogram on 08/15/2018 revealed no evidence of malignancy.    During the interim, she feels "okay." She reports she does not have much strength. Her weight is stable. She denies any fevers or sweats. She denies any headaches or visual changes. She denies any shortness of breath or chest pain. She denies any abdominal or urinary concerns. She has swelling in her legs and feet bilaterally.   She has balance issues, and uses a cane. She has had several falls, but not since using the cane. She is currently on Fosamax. She has arthritis.   She denies any breast concerns. She does not perform monthly breast exams. She was told she does not need to undergo another colonoscopy.    Past Medical History:  Diagnosis Date  . Breast cancer ([facility] 2017   DCIS of right breast  . Colon cancer ([facility] 2003   adenocarcinoma of the ascending colon, 2007  . Environmental allergies   . High cholesterol   . Hypertension   . Large cell lymphoma ([facility] 2007   Diffuse large cell lymphoma, completed CHOP and Rituxan in March 2004  . Non Hodgkin's lymphoma ([facility]  2003  . Personal history of chemotherapy 2003   Non Hodgkins  . Skin cancer     Past Surgical History:  Procedure Laterality Date  . BREAST BIOPSY Right 03/18/2016   DCIS  . BREAST BIOPSY Right 08/19/2017   ribbon marker, Fibrocystic changes- neg  . BREAST LUMPECTOMY Right 04/24/2016   DCIS clear  margins. SN biopsy-negative. Declined rad tx.   Marland Kitchen BREAST LUMPECTOMY WITH NEEDLE LOCALIZATION Right 04/24/2016   Procedure: BREAST LUMPECTOMY WITH NEEDLE LOCALIZATION;  Surgeon: Jules Husbands, MD;  Location: [facility];  Service: General;  Laterality: Right;  . COLON SURGERY  11/24/05   Cancer surgery  . COLONOSCOPY     2012  . COLONOSCOPY N/A 08/28/2015   Procedure: COLONOSCOPY;  Surgeon: Hulen Luster, MD;  Location: [facility];  Service: Gastroenterology;  Laterality: N/A;  . DIAGNOSTIC MAMMOGRAM     May 2014  . LYMPH NODE DISSECTION  2003   neck - non-hodgkin's lymphoma  . SENTINEL NODE BIOPSY Right 04/24/2016   Procedure: SENTINEL NODE BIOPSY;  Surgeon: Jules Husbands, MD;  Location: [facility];  Service: General;  Laterality: Right;    Family History  Problem Relation Age of Onset  . Breast cancer Sister 83       half sister  . Cancer Other        family history of colon cancer, breast cancer, lung cancer  . Cancer Paternal Uncle        colon  . Colon cancer Maternal Uncle   . Stomach cancer Maternal Grandmother     Social History:  reports that she has never smoked. She has never used smokeless tobacco. She reports that she does not drink alcohol or use drugs.She does not drink or smoke. She denies any known exposure to radiation or toxins. 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 She lives in [place]. The patient is alone today.  Allergies: No Known Allergies  Current Medications: Current Outpatient Medications  Medication Sig Dispense Refill  . acetaminophen (TYLENOL) 500 MG tablet Take 500 mg by mouth every 6 (six) hours as needed.    Marland Kitchen alendronate (FOSAMAX) 70 MG tablet Take 70 mg by mouth once a week.     Marland Kitchen amLODipine (NORVASC) 10 MG tablet Take by mouth.    Marland Kitchen aspirin 81 MG tablet Take 81 mg by mouth daily.    Marland Kitchen atorvastatin (LIPITOR) 10 MG tablet Take 10 mg by mouth daily.    Marland Kitchen lisinopril (PRINIVIL,ZESTRIL) 20 MG tablet  Take 20 mg by mouth daily.    . tamoxifen (NOLVADEX) 20 MG tablet Take 1 tablet (20 mg total) by mouth daily. 90 tablet 1  . Vitamin D, Ergocalciferol, (DRISDOL) 1.25 MG (50000 UT) CAPS capsule Take 50,000 Units by mouth every 7 (seven) days.     . diphenhydrAMINE (SOMINEX) 25 MG tablet Take 25 mg by mouth at bedtime as needed for sleep.     No current facility-administered medications for this visit.     Review of Systems  Constitutional: Positive for malaise/fatigue and weight loss (1lb). Negative for chills, diaphoresis and fever.  HENT: Negative.  Negative for congestion, hearing loss, sinus pain and sore throat.   Eyes: Negative.  Negative for blurred vision.  Respiratory: Negative.  Negative for cough, shortness of breath and wheezing.   Cardiovascular: Positive for leg swelling (BLE). Negative for chest pain, palpitations, orthopnea and PND.  Gastrointestinal: Negative.  Negative for abdominal  pain, blood in stool, constipation, diarrhea, melena, nausea and vomiting.  Genitourinary: Negative.  Negative for dysuria, frequency, hematuria and urgency.  Musculoskeletal: Positive for falls and joint pain (arthritis). Negative for back pain and myalgias.  Skin: Negative.  Negative for rash.  Neurological: Positive for dizziness and weakness (generalized). Negative for tingling, sensory change and headaches.  Endo/Heme/Allergies: Negative.  Does not bruise/bleed easily.  Psychiatric/Behavioral: Negative.  Negative for depression, memory loss, substance abuse and suicidal ideas. The patient is not nervous/anxious.   All other systems reviewed and are negative.  Performance status (ECOG): 1  Vitals Blood pressure (!) 147/79, pulse 100, temperature 98.4 F (36.9 C), temperature source Oral, resp. rate 18, weight 150 lb (68 kg), SpO2 98 %.   Physical Exam  Constitutional: She is oriented to person, place, and time. She appears well-developed and well-nourished. No distress.  HENT:  Head:  Normocephalic and atraumatic.  Mouth/Throat: Oropharynx is clear and moist. No oropharyngeal exudate.  Short curly gray hair. Mask.   Eyes: Pupils are equal, round, and reactive to light. Conjunctivae and EOM are normal. No scleral icterus.  Brown eyes.  Neck: Normal range of motion. Neck supple. No JVD present.  Cardiovascular: Normal rate, regular rhythm and normal heart sounds.  No murmur heard. Pulmonary/Chest: Effort normal and breath sounds normal. No respiratory distress. She has no wheezes. She has no rales. Right breast exhibits no inverted nipple, no mass (fibrocystic changes 9:00-12:00), no nipple discharge, no skin change (s/p lumpectomy) and no tenderness. Left breast exhibits no inverted nipple, no mass (fibrocystic changes UOQ, 12:00), no nipple discharge, no skin change and no tenderness.  Abdominal: Soft. Bowel sounds are normal. She exhibits no distension and no mass. There is no abdominal tenderness. There is no rebound and no guarding.  Musculoskeletal:        General: No edema.  Lymphadenopathy:    She has no cervical adenopathy.    She has no axillary adenopathy.       Right: No supraclavicular adenopathy present.       Left: No supraclavicular adenopathy present.  Neurological: She is alert and oriented to person, place, and time. Gait (uses cane to ambulate) abnormal.  Skin: Skin is warm and dry. No rash noted. She is not diaphoretic. No erythema.  Psychiatric: She has a normal mood and affect. Her behavior is normal. Judgment and thought content normal.  Nursing note and vitals reviewed.   No visits with results within 3 Day(s) from this visit.  Latest known visit with results is:  Infusion on 02/04/2018  Component Date Value Ref Range Status  . Sodium 02/04/2018 139  135 - 145 mmol/L Final  . Potassium 02/04/2018 3.9  3.5 - 5.1 mmol/L Final  . Chloride 02/04/2018 106  98 - 111 mmol/L Final   Please note change in reference range.  . CO2 02/04/2018 22  22 - 32  mmol/L Final  . Glucose, Bld 02/04/2018 115* 70 - 99 mg/dL Final   Please note change in reference range.  . BUN 02/04/2018 16  8 - 23 mg/dL Final   Please note change in reference range.  . Creatinine, Ser 02/04/2018 1.08* 0.44 - 1.00 mg/dL Final  . Calcium 02/04/2018 8.5* 8.9 - 10.3 mg/dL Final  . GFR calc non Af Amer 02/04/2018 45* >60 mL/min Final  . GFR calc Af Amer 02/04/2018 52* >60 mL/min Final   Comment: (NOTE) The eGFR has been calculated using the CKD EPI equation. This calculation has not been validated  in all clinical situations. eGFR's persistently <60 mL/min signify possible Chronic Kidney Disease.   Georgiann Hahn gap 02/04/2018 11  5 - 15 Final   Performed at [facility], [address] [place], [place] [address]  . WBC 02/04/2018 4.4  3.6 - 11.0 K/uL Final  . RBC 02/04/2018 4.00  3.80 - 5.20 MIL/uL Final  . Hemoglobin 02/04/2018 12.6  12.0 - 16.0 g/dL Final  . HCT 02/04/2018 37.7  35.0 - 47.0 % Final  . MCV 02/04/2018 94.3  80.0 - 100.0 fL Final  . MCH 02/04/2018 31.5  26.0 - 34.0 pg Final  . MCHC 02/04/2018 33.4  32.0 - 36.0 g/dL Final  . RDW 02/04/2018 13.9  11.5 - 14.5 % Final  . Platelets 02/04/2018 176  150 - 440 K/uL Final  . Neutrophils Relative % 02/04/2018 60  % Final  . Neutro Abs 02/04/2018 2.6  1.4 - 6.5 K/uL Final  . Lymphocytes Relative 02/04/2018 20  % Final  . Lymphs Abs 02/04/2018 0.9* 1.0 - 3.6 K/uL Final  . Monocytes Relative 02/04/2018 9  % Final  . Monocytes Absolute 02/04/2018 0.4  0.2 - 0.9 K/uL Final  . Eosinophils Relative 02/04/2018 10  % Final  . Eosinophils Absolute 02/04/2018 0.4  0 - 0.7 K/uL Final  . Basophils Relative 02/04/2018 1  % Final  . Basophils Absolute 02/04/2018 0.1  0 - 0.1 K/uL Final   Performed at [facility], [address] [place], [place] [address]    Assessment:  SHELBE HAGLUND is a 85 y.o. female with right breast DCIS s/p right lumpectomy on 04/24/2016.  Pathology revealed no residual DCIS,  focal atypical lobular hyperplasia and columnar cell changes. Two right axillary lymph nodes were negative for malignancy.  Biopsy on 03/18/2016 showed high grade DCIS.   Right diagnostic mammogram on 02/27/2016 showed suspicious calcifications in the right breast spanning 62m.  Breast MRI on 04/16/2016 showed linear non mass enhancement extending from the skin surface of the upper-outer quadrant to the biopsy marking clip in the upper-outer quadrant and continues 1.5 cm posterior to the clip. There was no MRI evidence of left breast malignancy.  She received no adjuvant radiation due to multiple comorbidities. She began tamoxifen on 05/11/2016.   Bilateral diagnostic mammogram on 08/15/2018 revealed no evidence of malignancy.   She has a history of B-cell lymphoma.  She completed treatment in 2004.  Last PET scan on 07/23/2010 revealed no evidence of malignancy.   She has a history of colon cancer s/p resection in 11/2005.  Last colonoscopy on 08/28/2015 revealed one small polyp in the sigmoid colon in the distal sigmoid colon. There was diverticulosis in the sigmoid colon and in the descending colon. The examination was otherwise normal. Pathology showed a hyperplastic polyp, negative for dysplasia or malignancy.   Symptomatically, she denies any fevers, sweats or weight loss.  She denies any breast concerns.  She denies any abdominal pain, melena or hematochezia.  Exam reveals no adenopathy or hepatosplenomegaly.  Plan: 1.   Labs today:  CBC with diff, CMP. 2.   Right breast DCIS  Review entire medical history, diagnosis and management of DCIS.   Mammogram on 07/2018 revealed no evidence of disease.  Continue tamoxifen.  Continue surveillance every 6 months 3.   History of B cell lymphoma  She is unclear of the details of her lymphoma and treatment.  Obtain records. 4.   History of colon cancer  She has a history  of a colon cancer 13 years ago.  By history, she appears to have had early  stage disease (stage I or II).  She denies any symptoms.  Colonoscopy in 08/2015 revealed 1 small polyp.  Continue yearly surveillance. 5.   Bilateral mammogram on 08/17/2019. 6.   RTC in 6 months for MD assessment and labs (CBC with diff, CMP).  I discussed the assessment and treatment plan with the patient.  The patient was provided an opportunity to ask questions and all were answered.  The patient agreed with the plan and demonstrated an understanding of the instructions.  The patient was advised to call back if the symptoms worsen or if the condition fails to improve as anticipated.  I provided 30 minutes of face-to-face time during this this encounter and > 50% was spent counseling as documented under my assessment and plan.    Lequita Asal, MD, PhD    02/03/2019, 2:44 PM  I, Molly Dorshimer, am acting as Education administrator for [employer]. Mike Gip, MD, PhD.  I, Melissa C. Mike Gip, MD, have reviewed the above documentation for accuracy and completeness, and I agree with the above.

## 2019-02-03 ENCOUNTER — Other Ambulatory Visit: Payer: Self-pay

## 2019-02-03 ENCOUNTER — Inpatient Hospital Stay: Payer: Medicare Other | Attending: Hematology and Oncology | Admitting: Hematology and Oncology

## 2019-02-03 ENCOUNTER — Inpatient Hospital Stay: Payer: Medicare Other

## 2019-02-03 ENCOUNTER — Ambulatory Visit: Payer: Medicare Other | Admitting: Oncology

## 2019-02-03 ENCOUNTER — Encounter: Payer: Self-pay | Admitting: Hematology and Oncology

## 2019-02-03 VITALS — BP 147/79 | HR 100 | Temp 98.4°F | Resp 18 | Wt 150.0 lb

## 2019-02-03 DIAGNOSIS — Z79810 Long term (current) use of selective estrogen receptor modulators (SERMs): Secondary | ICD-10-CM | POA: Diagnosis not present

## 2019-02-03 DIAGNOSIS — Z7982 Long term (current) use of aspirin: Secondary | ICD-10-CM | POA: Diagnosis not present

## 2019-02-03 DIAGNOSIS — Z8572 Personal history of non-Hodgkin lymphomas: Secondary | ICD-10-CM | POA: Insufficient documentation

## 2019-02-03 DIAGNOSIS — Z85828 Personal history of other malignant neoplasm of skin: Secondary | ICD-10-CM | POA: Diagnosis not present

## 2019-02-03 DIAGNOSIS — Z79899 Other long term (current) drug therapy: Secondary | ICD-10-CM | POA: Insufficient documentation

## 2019-02-03 DIAGNOSIS — Z803 Family history of malignant neoplasm of breast: Secondary | ICD-10-CM | POA: Insufficient documentation

## 2019-02-03 DIAGNOSIS — M7989 Other specified soft tissue disorders: Secondary | ICD-10-CM | POA: Insufficient documentation

## 2019-02-03 DIAGNOSIS — D0511 Intraductal carcinoma in situ of right breast: Secondary | ICD-10-CM | POA: Insufficient documentation

## 2019-02-03 DIAGNOSIS — Z85038 Personal history of other malignant neoplasm of large intestine: Secondary | ICD-10-CM | POA: Insufficient documentation

## 2019-02-03 DIAGNOSIS — E785 Hyperlipidemia, unspecified: Secondary | ICD-10-CM | POA: Diagnosis not present

## 2019-02-03 DIAGNOSIS — I10 Essential (primary) hypertension: Secondary | ICD-10-CM | POA: Diagnosis not present

## 2019-02-03 LAB — COMPREHENSIVE METABOLIC PANEL
ALT: 15 U/L (ref 0–44)
AST: 16 U/L (ref 15–41)
Albumin: 3.4 g/dL — ABNORMAL LOW (ref 3.5–5.0)
Alkaline Phosphatase: 44 U/L (ref 38–126)
Anion gap: 10 (ref 5–15)
BUN: 18 mg/dL (ref 8–23)
CO2: 26 mmol/L (ref 22–32)
Calcium: 8.7 mg/dL — ABNORMAL LOW (ref 8.9–10.3)
Chloride: 101 mmol/L (ref 98–111)
Creatinine, Ser: 0.88 mg/dL (ref 0.44–1.00)
GFR calc Af Amer: >60 mL/min (ref >60)
GFR calc non Af Amer: 59 mL/min — ABNORMAL LOW (ref >60)
Glucose, Bld: 125 mg/dL — ABNORMAL HIGH (ref 70–99)
Potassium: 3.4 mmol/L — ABNORMAL LOW (ref 3.5–5.1)
Sodium: 137 mmol/L (ref 135–145)
Total Bilirubin: 0.6 mg/dL (ref 0.3–1.2)
Total Protein: 6.7 g/dL (ref 6.5–8.1)

## 2019-02-03 LAB — CBC WITH DIFFERENTIAL/PLATELET
Abs Immature Granulocytes: 0.02 10*3/uL (ref 0.00–0.07)
Basophils Absolute: 0 10*3/uL (ref 0.0–0.1)
Basophils Relative: 1 %
Eosinophils Absolute: 0.3 10*3/uL (ref 0.0–0.5)
Eosinophils Relative: 5 %
HCT: 36.1 % (ref 36.0–46.0)
Hemoglobin: 11.8 g/dL — ABNORMAL LOW (ref 12.0–15.0)
Immature Granulocytes: 0 %
Lymphocytes Relative: 15 %
Lymphs Abs: 0.9 10*3/uL (ref 0.7–4.0)
MCH: 31.6 pg (ref 26.0–34.0)
MCHC: 32.7 g/dL (ref 30.0–36.0)
MCV: 96.5 fL (ref 80.0–100.0)
Monocytes Absolute: 0.6 10*3/uL (ref 0.1–1.0)
Monocytes Relative: 10 %
Neutro Abs: 4.1 10*3/uL (ref 1.7–7.7)
Neutrophils Relative %: 69 %
Platelets: 209 10*3/uL (ref 150–400)
RBC: 3.74 MIL/uL — ABNORMAL LOW (ref 3.87–5.11)
RDW: 13.1 % (ref 11.5–15.5)
WBC: 5.9 10*3/uL (ref 4.0–10.5)
nRBC: 0 % (ref 0.0–0.2)

## 2019-02-03 NOTE — Progress Notes (Signed)
Pt here for follow up. Previous Dr. Gary Fleet patient. Denies any concerns. Patient diaphoretic during visit. Patient states she "got hot on the [place] in from outside".

## 2019-02-27 ENCOUNTER — Other Ambulatory Visit: Payer: Self-pay | Admitting: Oncology

## 2019-06-02 ENCOUNTER — Other Ambulatory Visit: Payer: Self-pay | Admitting: *Deleted

## 2019-06-02 MED ORDER — TAMOXIFEN CITRATE 20 MG PO TABS: 20.0000 mg | ORAL_TABLET | Freq: Every day | ORAL | 0 refills | Status: DC

## 2019-09-19 NOTE — Progress Notes (Signed)
[facility]  [address], [address] [place], [place] [address] Phone: [phone]  Fax: [phone]   Clinic Day:  09/20/2019  Referring physician: Ezequiel Kayser, MD  Chief Complaint: Janice [name] is a 86 y.o. female with high grade right breast DCIS  who is seen for a 8 month assessment.   HPI: The patient was last seen in the medical oncology clinic on 02/03/2019 as a new patient.  She was diagnosed with right breast DCIS in 2017.  She underwent lumpectomy, but received no adjuvant radiation due to multiple comorbidities.  She began tamoxifen on 05/11/2016.  Symptomatically, she denied any breast concerns. She denied any abdominal pain, melena or hematochezia.  Exam revealed no adenopathy or hepatosplenomegaly. Hematocrit was 36.1, hemoglobin 11.8, platelets 209,000, WBC 5,900. Potassium 3.4, calcium 8.7, albumin 3.4. She remained on tamoxifen.   Mammogram was scheduled for 08/17/2019.  During the interim, she has been doing fine. She does not perform monthly breast exams. She has stayed in the house during the pandemic. She has only left her home to get a flu shot and go to her brothers funeral.   She describes her energy level as low. She notes that she is not ambulating well. Her son is concerned about her walking. Patient states issues started 3 years ago and have progressed over the pass several months. She is not in physical therapy (PT). The swelling in her legs is better. She denies any recent falls. She has no issues with dizziness.   She notes that she does not sleep well at night. She declined a PT referral. Her son notes she uses a cane to get around the house. He reports that she sleeps most of the day. She has a fear of falling so she has to be extra careful.   She is taking vitamin D but no calcium. She is taking tamoxifen.    Past Medical History:  Diagnosis Date  . Breast cancer ([place]) 2017   DCIS of right breast  . Colon cancer ([place]) 2003    adenocarcinoma of the ascending colon, 2007  . Environmental allergies   . High cholesterol   . Hypertension   . Large cell lymphoma ([place]) 2007   Diffuse large cell lymphoma, completed CHOP and Rituxan in March 2004  . Non Hodgkin's lymphoma ([place]) 2003  . Personal history of chemotherapy 2003   Non Hodgkins  . Skin cancer     Past Surgical History:  Procedure Laterality Date  . BREAST BIOPSY Right 03/18/2016   DCIS  . BREAST BIOPSY Right 08/19/2017   ribbon marker, Fibrocystic changes- neg  . BREAST LUMPECTOMY Right 04/24/2016   DCIS clear margins. SN biopsy-negative. Declined rad tx.   Marland Kitchen BREAST LUMPECTOMY WITH NEEDLE LOCALIZATION Right 04/24/2016   Procedure: BREAST LUMPECTOMY WITH NEEDLE LOCALIZATION;  Surgeon: Jules Husbands, MD;  Location: [facility];  Service: General;  Laterality: Right;  . COLON SURGERY  11/24/05   Cancer surgery  . COLONOSCOPY     2012  . COLONOSCOPY N/A 08/28/2015   Procedure: COLONOSCOPY;  Surgeon: Hulen Luster, MD;  Location: [facility];  Service: Gastroenterology;  Laterality: N/A;  . DIAGNOSTIC MAMMOGRAM     May 2014  . LYMPH NODE DISSECTION  2003   neck - non-hodgkin's lymphoma  . SENTINEL NODE BIOPSY Right 04/24/2016   Procedure: SENTINEL NODE BIOPSY;  Surgeon: Jules Husbands, MD;  Location: [facility];  Service: General;  Laterality: Right;    Family History  Problem Relation Age of Onset  . Breast cancer Sister 9       half sister  . Cancer Other        family history of colon cancer, breast cancer, lung cancer  . Cancer Paternal Uncle        colon  . Colon cancer Maternal Uncle   . Stomach cancer Maternal Grandmother     Social History:  reports that she has never smoked. She has never used smokeless tobacco. She reports that she does not drink alcohol or use drugs. She denies any known exposure to radiation or toxins. 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 She lives in [place]. The  patient is accompanied by her son, Vicente Serene, via [place] today.  Allergies: No Known Allergies  Current Medications: Current Outpatient Medications  Medication Sig Dispense Refill  . acetaminophen (TYLENOL) 500 MG tablet Take 500 mg by mouth every 6 (six) hours as needed.    Marland Kitchen alendronate (FOSAMAX) 70 MG tablet Take 70 mg by mouth once a week.     Marland Kitchen amLODipine (NORVASC) 10 MG tablet Take 10 mg by mouth daily.     Marland Kitchen aspirin 81 MG tablet Take 81 mg by mouth daily.    Marland Kitchen atorvastatin (LIPITOR) 10 MG tablet Take 10 mg by mouth daily.    Marland Kitchen lisinopril (PRINIVIL,ZESTRIL) 20 MG tablet Take 20 mg by mouth daily.    . tamoxifen (NOLVADEX) 20 MG tablet Take 1 tablet (20 mg total) by mouth daily. 90 tablet 0  . Vitamin D, Ergocalciferol, (DRISDOL) 1.25 MG (50000 UT) CAPS capsule Take 50,000 Units by mouth every 7 (seven) days.      No current facility-administered medications for this visit.    Review of Systems  Constitutional: Positive for malaise/fatigue (low energy). Negative for chills, diaphoresis, fever and weight loss (up 12 lbs).       Feels "ok":  Uses cane to ambulate at home.  HENT: Negative.  Negative for congestion, ear pain, hearing loss, sinus pain and sore throat.   Eyes: Negative.  Negative for blurred vision.  Respiratory: Negative.  Negative for cough, shortness of breath and wheezing.   Cardiovascular: Negative for chest pain, palpitations, orthopnea, leg swelling (BLE) and PND.  Gastrointestinal: Negative.  Negative for abdominal pain, blood in stool, constipation, diarrhea, melena, nausea and vomiting.  Genitourinary: Negative.  Negative for dysuria, frequency, hematuria and urgency.  Musculoskeletal: Negative for back pain, falls, joint pain (arthritis) and myalgias.  Skin: Negative.  Negative for rash.  Neurological: Positive for weakness (generalized). Negative for dizziness, tingling, sensory change and headaches.       Difficulty walking.  Endo/Heme/Allergies: Negative.   Does not bruise/bleed easily.  Psychiatric/Behavioral: Negative for depression, memory loss, substance abuse and suicidal ideas. The patient is not nervous/anxious and does not have insomnia.        Sleeps most of day.  All other systems reviewed and are negative.  Performance status (ECOG): 2  Vitals Blood pressure 132/62, pulse 79, temperature (!) 96.9 F (36.1 C), temperature source Tympanic, resp. rate 16, weight 162 lb 3.2 oz (73.6 kg), SpO2 95 %.   Physical Exam  Constitutional: She is oriented to person, place, and time. She appears well-developed and well-nourished. No distress.  Patient examined in wheelchair.  HENT:  Head: Normocephalic and atraumatic.  Mouth/Throat: Oropharynx is clear and moist. No oropharyngeal exudate.  Short styled gray hair. Mask.   Eyes: Pupils are  equal, round, and reactive to light. Conjunctivae and EOM are normal. No scleral icterus.  Hazel/brown eyes.  Neck: No JVD present.  Cardiovascular: Normal rate, regular rhythm and normal heart sounds.  No murmur heard. Pulmonary/Chest: Effort normal and breath sounds normal. No respiratory distress. She has no wheezes. She has no rales. Right breast exhibits no inverted nipple, no mass, no nipple discharge, no skin change (s/p lumpectomy) and no tenderness. Left breast exhibits no inverted nipple, no mass, no nipple discharge, no skin change and no tenderness.  Abdominal: Soft. Bowel sounds are normal. She exhibits no distension and no mass. There is no abdominal tenderness. There is no rebound and no guarding.  Musculoskeletal:        General: Edema (trace to 1+ lower extremity) present. No tenderness. Normal range of motion.     Cervical back: Normal range of motion and neck supple.  Lymphadenopathy:       Head (right side): No preauricular, no posterior auricular and no occipital adenopathy present.       Head (left side): No preauricular, no posterior auricular and no occipital adenopathy present.     She has no cervical adenopathy.    She has no axillary adenopathy.       Right: No inguinal and no supraclavicular adenopathy present.       Left: No inguinal and no supraclavicular adenopathy present.  Neurological: She is alert and oriented to person, place, and time.  Skin: Skin is warm and dry. No rash noted. She is not diaphoretic. No erythema.  Psychiatric: She has a normal mood and affect. Her behavior is normal. Judgment and thought content normal.  Nursing note and vitals reviewed.   Appointment on 09/20/2019  Component Date Value Ref Range Status  . Sodium 09/20/2019 136  135 - 145 mmol/L Final  . Potassium 09/20/2019 3.7  3.5 - 5.1 mmol/L Final  . Chloride 09/20/2019 100  98 - 111 mmol/L Final  . CO2 09/20/2019 26  22 - 32 mmol/L Final  . Glucose, Bld 09/20/2019 97  70 - 99 mg/dL Final   Glucose reference range applies only to samples taken after fasting for at least 8 hours.  . BUN 09/20/2019 24* 8 - 23 mg/dL Final  . Creatinine, Ser 09/20/2019 1.12* 0.44 - 1.00 mg/dL Final  . Calcium 09/20/2019 8.5* 8.9 - 10.3 mg/dL Final  . Total Protein 09/20/2019 6.8  6.5 - 8.1 g/dL Final  . Albumin 09/20/2019 3.5  3.5 - 5.0 g/dL Final  . AST 09/20/2019 16  15 - 41 U/L Final  . ALT 09/20/2019 11  0 - 44 U/L Final  . Alkaline Phosphatase 09/20/2019 56  38 - 126 U/L Final  . Total Bilirubin 09/20/2019 0.6  0.3 - 1.2 mg/dL Final  . GFR calc non Af Amer 09/20/2019 44* >60 mL/min Final  . GFR calc Af Amer 09/20/2019 51* >60 mL/min Final  . Anion gap 09/20/2019 10  5 - 15 Final   Performed at [facility], [address], [place], [place] [address]  . WBC 09/20/2019 4.8  4.0 - 10.5 K/uL Final  . RBC 09/20/2019 3.90  3.87 - 5.11 MIL/uL Final  . Hemoglobin 09/20/2019 12.2  12.0 - 15.0 g/dL Final  . HCT 09/20/2019 37.4  36.0 - 46.0 % Final  . MCV 09/20/2019 95.9  80.0 - 100.0 fL Final  . MCH 09/20/2019 31.3  26.0 - 34.0 pg Final  . MCHC 09/20/2019 32.6  30.0 - 36.0 g/dL Final   .  RDW 09/20/2019 13.2  11.5 - 15.5 % Final  . Platelets 09/20/2019 206  150 - 400 K/uL Final  . nRBC 09/20/2019 0.0  0.0 - 0.2 % Final  . Neutrophils Relative % 09/20/2019 59  % Final  . Neutro Abs 09/20/2019 2.8  1.7 - 7.7 K/uL Final  . Lymphocytes Relative 09/20/2019 23  % Final  . Lymphs Abs 09/20/2019 1.1  0.7 - 4.0 K/uL Final  . Monocytes Relative 09/20/2019 10  % Final  . Monocytes Absolute 09/20/2019 0.5  0.1 - 1.0 K/uL Final  . Eosinophils Relative 09/20/2019 7  % Final  . Eosinophils Absolute 09/20/2019 0.4  0.0 - 0.5 K/uL Final  . Basophils Relative 09/20/2019 1  % Final  . Basophils Absolute 09/20/2019 0.0  0.0 - 0.1 K/uL Final  . Immature Granulocytes 09/20/2019 0  % Final  . Abs Immature Granulocytes 09/20/2019 0.02  0.00 - 0.07 K/uL Final   Performed at [facility], [address] [place], [place] [address]    Assessment:  Janice [name] is a 86 y.o. female with right breast DCIS s/p right lumpectomy on 04/24/2016.  Pathology revealed no residual DCIS, focal atypical lobular hyperplasia and columnar cell changes. Two right axillary lymph nodes were negative for malignancy.  Biopsy on 03/18/2016 showed high grade DCIS.   Right diagnostic mammogram on 02/27/2016 showed suspicious calcifications in the right breast spanning 30mm.  Breast MRI on 04/16/2016 showed linear non mass enhancement extending from the skin surface of the upper-outer quadrant to the biopsy marking clip in the upper-outer quadrant and continues 1.5 cm posterior to the clip. There was no MRI evidence of left breast malignancy.  She received no adjuvant radiation due to multiple comorbidities. She began tamoxifen on 05/11/2016.   Bilateral diagnostic mammogram on 08/15/2018 revealed no evidence of malignancy.   She has a history of B-cell lymphoma.  She completed treatment in 2004.  Last PET scan on 07/23/2010 revealed no evidence of malignancy.   She has a history of colon cancer s/p  resection in 11/2005.  Last colonoscopy on 08/28/2015 revealed one small polyp in the sigmoid colon in the distal sigmoid colon. There was diverticulosis in the sigmoid colon and in the descending colon. The examination was otherwise normal. Pathology showed a hyperplastic polyp, negative for dysplasia or malignancy.   Symptomatically, she feels "ok".  She denies any breast concerns.  Exam is stable.  Plan: 1.   Labs today: CBC with diff, CMP. 2.   Right breast DCIS             Clinically, she is doing well.  Exam reveals no evidence of recurrent disease.              Mammogram on 07/2018 revealed no evidence of disease.             Reschedule mammogram (late).  She is tolerating tamoxifen well.  Continue tamoxifen. 3.   History of B cell lymphoma             Clinically, she is doing well.  Await outside records. 4.   History of colon cancer             She has a history of a colon cancer 13 years ago.             By history, she appears to have had early stage disease (stage I or II).  She denies any GI symptoms.             Colonoscopy in 08/2015 revealed 1 small polyp.             Continue yearly surveillance. 5.   Reschedule mammogram. 6.   RTC in 6 months for MD assessment and labs (CBC with diff, CMP).  I discussed the assessment and treatment plan with the patient.  The patient was provided an opportunity to ask questions and all were answered.  The patient agreed with the plan and demonstrated an understanding of the instructions.  The patient was advised to call back if the symptoms worsen or if the condition fails to improve as anticipated.  I provided 19 minutes of face-to-face time during this this encounter and > 50% was spent counseling as documented under my assessment and plan.  An additional 5 minutes were spent reviewing her chart (Epic and Care Everywhere) including notes and labs.    Lequita Asal, MD, PhD    09/20/2019, 2:08 PM  I, Selena Batten,  am acting as scribe for [employer]. Mike Gip, MD, PhD.  I, Melissa C. Mike Gip, MD, have reviewed the above documentation for accuracy and completeness, and I agree with the above.

## 2019-09-20 ENCOUNTER — Telehealth: Payer: Self-pay

## 2019-09-20 ENCOUNTER — Inpatient Hospital Stay: Payer: Medicare Other | Attending: Hematology and Oncology | Admitting: Hematology and Oncology

## 2019-09-20 ENCOUNTER — Inpatient Hospital Stay: Payer: Medicare Other

## 2019-09-20 ENCOUNTER — Other Ambulatory Visit: Payer: Self-pay

## 2019-09-20 VITALS — BP 132/62 | HR 79 | Temp 96.9°F | Resp 16 | Wt 162.2 lb

## 2019-09-20 DIAGNOSIS — Z8571 Personal history of Hodgkin lymphoma: Secondary | ICD-10-CM | POA: Insufficient documentation

## 2019-09-20 DIAGNOSIS — M7989 Other specified soft tissue disorders: Secondary | ICD-10-CM | POA: Insufficient documentation

## 2019-09-20 DIAGNOSIS — D125 Benign neoplasm of sigmoid colon: Secondary | ICD-10-CM | POA: Insufficient documentation

## 2019-09-20 DIAGNOSIS — Z79810 Long term (current) use of selective estrogen receptor modulators (SERMs): Secondary | ICD-10-CM | POA: Diagnosis not present

## 2019-09-20 DIAGNOSIS — Z8572 Personal history of non-Hodgkin lymphomas: Secondary | ICD-10-CM | POA: Diagnosis not present

## 2019-09-20 DIAGNOSIS — R5383 Other fatigue: Secondary | ICD-10-CM | POA: Insufficient documentation

## 2019-09-20 DIAGNOSIS — Z800 Family history of malignant neoplasm of digestive organs: Secondary | ICD-10-CM | POA: Diagnosis not present

## 2019-09-20 DIAGNOSIS — Z803 Family history of malignant neoplasm of breast: Secondary | ICD-10-CM | POA: Diagnosis not present

## 2019-09-20 DIAGNOSIS — R531 Weakness: Secondary | ICD-10-CM | POA: Insufficient documentation

## 2019-09-20 DIAGNOSIS — Z79899 Other long term (current) drug therapy: Secondary | ICD-10-CM | POA: Insufficient documentation

## 2019-09-20 DIAGNOSIS — Z85828 Personal history of other malignant neoplasm of skin: Secondary | ICD-10-CM | POA: Insufficient documentation

## 2019-09-20 DIAGNOSIS — Z801 Family history of malignant neoplasm of trachea, bronchus and lung: Secondary | ICD-10-CM | POA: Diagnosis not present

## 2019-09-20 DIAGNOSIS — Z9221 Personal history of antineoplastic chemotherapy: Secondary | ICD-10-CM | POA: Insufficient documentation

## 2019-09-20 DIAGNOSIS — D0511 Intraductal carcinoma in situ of right breast: Secondary | ICD-10-CM | POA: Diagnosis present

## 2019-09-20 DIAGNOSIS — Z85038 Personal history of other malignant neoplasm of large intestine: Secondary | ICD-10-CM | POA: Diagnosis not present

## 2019-09-20 LAB — CBC WITH DIFFERENTIAL/PLATELET
Abs Immature Granulocytes: 0.02 10*3/uL (ref 0.00–0.07)
Basophils Absolute: 0 10*3/uL (ref 0.0–0.1)
Basophils Relative: 1 %
Eosinophils Absolute: 0.4 10*3/uL (ref 0.0–0.5)
Eosinophils Relative: 7 %
HCT: 37.4 % (ref 36.0–46.0)
Hemoglobin: 12.2 g/dL (ref 12.0–15.0)
Immature Granulocytes: 0 %
Lymphocytes Relative: 23 %
Lymphs Abs: 1.1 10*3/uL (ref 0.7–4.0)
MCH: 31.3 pg (ref 26.0–34.0)
MCHC: 32.6 g/dL (ref 30.0–36.0)
MCV: 95.9 fL (ref 80.0–100.0)
Monocytes Absolute: 0.5 10*3/uL (ref 0.1–1.0)
Monocytes Relative: 10 %
Neutro Abs: 2.8 10*3/uL (ref 1.7–7.7)
Neutrophils Relative %: 59 %
Platelets: 206 10*3/uL (ref 150–400)
RBC: 3.9 MIL/uL (ref 3.87–5.11)
RDW: 13.2 % (ref 11.5–15.5)
WBC: 4.8 10*3/uL (ref 4.0–10.5)
nRBC: 0 % (ref 0.0–0.2)

## 2019-09-20 LAB — COMPREHENSIVE METABOLIC PANEL
ALT: 11 U/L (ref 0–44)
AST: 16 U/L (ref 15–41)
Albumin: 3.5 g/dL (ref 3.5–5.0)
Alkaline Phosphatase: 56 U/L (ref 38–126)
Anion gap: 10 (ref 5–15)
BUN: 24 mg/dL — ABNORMAL HIGH (ref 8–23)
CO2: 26 mmol/L (ref 22–32)
Calcium: 8.5 mg/dL — ABNORMAL LOW (ref 8.9–10.3)
Chloride: 100 mmol/L (ref 98–111)
Creatinine, Ser: 1.12 mg/dL — ABNORMAL HIGH (ref 0.44–1.00)
GFR calc Af Amer: 51 mL/min — ABNORMAL LOW (ref >60)
GFR calc non Af Amer: 44 mL/min — ABNORMAL LOW (ref >60)
Glucose, Bld: 97 mg/dL (ref 70–99)
Potassium: 3.7 mmol/L (ref 3.5–5.1)
Sodium: 136 mmol/L (ref 135–145)
Total Bilirubin: 0.6 mg/dL (ref 0.3–1.2)
Total Protein: 6.8 g/dL (ref 6.5–8.1)

## 2019-09-20 NOTE — Telephone Encounter (Signed)
Labs forwarded to Dr. Raechel Ache, PCP to review per Dr. Kem Parkinson request.

## 2019-09-20 NOTE — Progress Notes (Signed)
Patient here for follow up. Son reports concerns with patient's walking. Patient states issues started approx. 3 years ago but has seemed to progress over the past several months. PCP is aware per patient and has not suggested PT. Denies any other concerns.

## 2019-09-20 NOTE — Telephone Encounter (Signed)
-----   Message from Lequita Asal, MD sent at 09/20/2019  4:50 PM EST ----- Regarding: Please send chemistries to PCP  ----- Message ----- From: Interface, Lab In [place] Sent: 09/20/2019   1:38 PM EST To: Lequita Asal, MD

## 2019-09-22 ENCOUNTER — Ambulatory Visit: Payer: Medicare Other | Attending: Internal Medicine

## 2019-09-22 DIAGNOSIS — Z23 Encounter for immunization: Secondary | ICD-10-CM

## 2019-09-22 NOTE — Progress Notes (Signed)
   Covid-19 Vaccination Clinic  Name:  Janice [name]    MRN: [id number] DOB: [birth year]  09/22/2019  Ms. Coach was observed post Covid-19 immunization for 15 minutes without incident. She was provided with Vaccine Information Sheet and instruction to access the V-Safe system.   [name] was instructed to call 911 with any severe reactions post vaccine: Marland Kitchen Difficulty breathing  . Swelling of face and throat  . A fast heartbeat  . A bad rash all over body  . Dizziness and weakness   Immunizations Administered    Name Date Dose VIS Date Route   Pfizer COVID-19 Vaccine 09/22/2019 11:57 AM 0.3 mL 06/30/2019 Intramuscular   Manufacturer: [place]   Lot: [id number]   [place]: [id number]

## 2019-10-02 ENCOUNTER — Other Ambulatory Visit: Payer: Self-pay

## 2019-10-02 MED ORDER — TAMOXIFEN CITRATE 20 MG PO TABS: 20.0000 mg | ORAL_TABLET | Freq: Every day | ORAL | 0 refills | Status: DC

## 2019-10-09 ENCOUNTER — Ambulatory Visit
Admission: RE | Admit: 2019-10-09 | Discharge: 2019-10-09 | Disposition: A | Discharge status: Home / Self Care | Payer: Medicare Other | Source: Ambulatory Visit | Attending: Hematology and Oncology | Admitting: Hematology and Oncology

## 2019-10-09 DIAGNOSIS — D0511 Intraductal carcinoma in situ of right breast: Secondary | ICD-10-CM | POA: Diagnosis not present

## 2019-10-13 ENCOUNTER — Ambulatory Visit: Payer: Medicare Other | Attending: Internal Medicine

## 2019-10-13 DIAGNOSIS — Z23 Encounter for immunization: Secondary | ICD-10-CM

## 2019-10-13 NOTE — Progress Notes (Signed)
   Covid-19 Vaccination Clinic  Name:  Janice [name]    MRN: [id number] DOB: [birth year]  10/13/2019  Janice [name] was observed post Covid-19 immunization for 15 minutes without incident. She was provided with Vaccine Information Sheet and instruction to access the V-Safe system.   Janice [name] was instructed to call 911 with any severe reactions post vaccine: Marland Kitchen Difficulty breathing  . Swelling of face and throat  . A fast heartbeat  . A bad rash all over body  . Dizziness and weakness   Immunizations Administered    Name Date Dose VIS Date Route   Pfizer COVID-19 Vaccine 10/13/2019 10:46 AM 0.3 mL 06/30/2019 Intramuscular   Manufacturer: [place]   Lot: [id number]   [place]: [id number]

## 2020-01-01 ENCOUNTER — Other Ambulatory Visit: Payer: Self-pay | Admitting: Hematology and Oncology

## 2020-01-02 ENCOUNTER — Other Ambulatory Visit: Payer: Self-pay

## 2020-01-02 ENCOUNTER — Emergency Department: Payer: Medicare Other

## 2020-01-02 ENCOUNTER — Other Ambulatory Visit: Payer: Self-pay | Admitting: Orthopedic Surgery

## 2020-01-02 ENCOUNTER — Inpatient Hospital Stay
Admission: EM | Admit: 2020-01-02 | Discharge: 2020-01-05 | DRG: 516 | Disposition: A | Discharge status: Skilled Nursing Facility | Payer: Medicare Other | Attending: Internal Medicine | Admitting: Internal Medicine

## 2020-01-02 ENCOUNTER — Encounter: Payer: Self-pay | Admitting: Medical Oncology

## 2020-01-02 DIAGNOSIS — Z8572 Personal history of non-Hodgkin lymphomas: Secondary | ICD-10-CM

## 2020-01-02 DIAGNOSIS — I4510 Unspecified right bundle-branch block: Secondary | ICD-10-CM | POA: Diagnosis present

## 2020-01-02 DIAGNOSIS — J811 Chronic pulmonary edema: Secondary | ICD-10-CM | POA: Diagnosis not present

## 2020-01-02 DIAGNOSIS — Z79899 Other long term (current) drug therapy: Secondary | ICD-10-CM

## 2020-01-02 DIAGNOSIS — M479 Spondylosis, unspecified: Secondary | ICD-10-CM | POA: Diagnosis present

## 2020-01-02 DIAGNOSIS — I10 Essential (primary) hypertension: Secondary | ICD-10-CM | POA: Diagnosis present

## 2020-01-02 DIAGNOSIS — Z419 Encounter for procedure for purposes other than remedying health state, unspecified: Secondary | ICD-10-CM

## 2020-01-02 DIAGNOSIS — Z9181 History of falling: Secondary | ICD-10-CM

## 2020-01-02 DIAGNOSIS — Z20822 Contact with and (suspected) exposure to covid-19: Secondary | ICD-10-CM | POA: Diagnosis present

## 2020-01-02 DIAGNOSIS — S3210XA Unspecified fracture of sacrum, initial encounter for closed fracture: Secondary | ICD-10-CM | POA: Diagnosis present

## 2020-01-02 DIAGNOSIS — R296 Repeated falls: Secondary | ICD-10-CM | POA: Diagnosis present

## 2020-01-02 DIAGNOSIS — R0902 Hypoxemia: Secondary | ICD-10-CM | POA: Diagnosis not present

## 2020-01-02 DIAGNOSIS — Z803 Family history of malignant neoplasm of breast: Secondary | ICD-10-CM

## 2020-01-02 DIAGNOSIS — I440 Atrioventricular block, first degree: Secondary | ICD-10-CM | POA: Diagnosis present

## 2020-01-02 DIAGNOSIS — Z85828 Personal history of other malignant neoplasm of skin: Secondary | ICD-10-CM

## 2020-01-02 DIAGNOSIS — M4858XA Collapsed vertebra, not elsewhere classified, sacral and sacrococcygeal region, initial encounter for fracture: Secondary | ICD-10-CM | POA: Diagnosis present

## 2020-01-02 DIAGNOSIS — E785 Hyperlipidemia, unspecified: Secondary | ICD-10-CM | POA: Diagnosis present

## 2020-01-02 DIAGNOSIS — Z9221 Personal history of antineoplastic chemotherapy: Secondary | ICD-10-CM | POA: Diagnosis not present

## 2020-01-02 DIAGNOSIS — M8448XA Pathological fracture, other site, initial encounter for fracture: Secondary | ICD-10-CM

## 2020-01-02 DIAGNOSIS — Z85038 Personal history of other malignant neoplasm of large intestine: Secondary | ICD-10-CM | POA: Diagnosis not present

## 2020-01-02 DIAGNOSIS — I959 Hypotension, unspecified: Secondary | ICD-10-CM | POA: Diagnosis not present

## 2020-01-02 DIAGNOSIS — Z853 Personal history of malignant neoplasm of breast: Secondary | ICD-10-CM

## 2020-01-02 DIAGNOSIS — Z7982 Long term (current) use of aspirin: Secondary | ICD-10-CM

## 2020-01-02 DIAGNOSIS — R000 Tachycardia, unspecified: Secondary | ICD-10-CM | POA: Diagnosis not present

## 2020-01-02 DIAGNOSIS — E7800 Pure hypercholesterolemia, unspecified: Secondary | ICD-10-CM | POA: Diagnosis present

## 2020-01-02 DIAGNOSIS — M4800 Spinal stenosis, site unspecified: Secondary | ICD-10-CM | POA: Diagnosis present

## 2020-01-02 DIAGNOSIS — I491 Atrial premature depolarization: Secondary | ICD-10-CM | POA: Diagnosis present

## 2020-01-02 DIAGNOSIS — R52 Pain, unspecified: Secondary | ICD-10-CM

## 2020-01-02 DIAGNOSIS — S32020A Wedge compression fracture of second lumbar vertebra, initial encounter for closed fracture: Secondary | ICD-10-CM

## 2020-01-02 LAB — CBC WITH DIFFERENTIAL/PLATELET
Abs Immature Granulocytes: 0.03 10*3/uL (ref 0.00–0.07)
Basophils Absolute: 0 10*3/uL (ref 0.0–0.1)
Basophils Relative: 1 %
Eosinophils Absolute: 0.1 10*3/uL (ref 0.0–0.5)
Eosinophils Relative: 1 %
HCT: 39.4 % (ref 36.0–46.0)
Hemoglobin: 13.1 g/dL (ref 12.0–15.0)
Immature Granulocytes: 1 %
Lymphocytes Relative: 15 %
Lymphs Abs: 0.9 10*3/uL (ref 0.7–4.0)
MCH: 31 pg (ref 26.0–34.0)
MCHC: 33.2 g/dL (ref 30.0–36.0)
MCV: 93.4 fL (ref 80.0–100.0)
Monocytes Absolute: 0.6 10*3/uL (ref 0.1–1.0)
Monocytes Relative: 9 %
Neutro Abs: 4.7 10*3/uL (ref 1.7–7.7)
Neutrophils Relative %: 73 %
Platelets: 217 10*3/uL (ref 150–400)
RBC: 4.22 MIL/uL (ref 3.87–5.11)
RDW: 13 % (ref 11.5–15.5)
WBC: 6.4 10*3/uL (ref 4.0–10.5)
nRBC: 0 % (ref 0.0–0.2)

## 2020-01-02 LAB — SARS CORONAVIRUS 2 BY RT PCR (HOSPITAL ORDER, PERFORMED IN ~~LOC~~ HOSPITAL LAB): SARS Coronavirus 2: NEGATIVE

## 2020-01-02 LAB — BASIC METABOLIC PANEL
Anion gap: 14 (ref 5–15)
BUN: 18 mg/dL (ref 8–23)
CO2: 24 mmol/L (ref 22–32)
Calcium: 9.2 mg/dL (ref 8.9–10.3)
Chloride: 99 mmol/L (ref 98–111)
Creatinine, Ser: 0.93 mg/dL (ref 0.44–1.00)
GFR calc Af Amer: >60 mL/min (ref >60)
GFR calc non Af Amer: 54 mL/min — ABNORMAL LOW (ref >60)
Glucose, Bld: 98 mg/dL (ref 70–99)
Potassium: 4 mmol/L (ref 3.5–5.1)
Sodium: 137 mmol/L (ref 135–145)

## 2020-01-02 MED ORDER — LORAZEPAM 2 MG/ML IJ SOLN: 0.5000 mg | Freq: Once | INTRAMUSCULAR | Status: DC

## 2020-01-02 MED ORDER — POLYETHYLENE GLYCOL 3350 17 G PO PACK
17.0000 g | PACK | Freq: Every day | ORAL | Status: DC | PRN
  Administered 2020-01-03: 17 g via ORAL
  Filled 2020-01-02: qty 1

## 2020-01-02 MED ORDER — LISINOPRIL 20 MG PO TABS
20.0000 mg | ORAL_TABLET | Freq: Every day | ORAL | Status: DC
  Administered 2020-01-04 – 2020-01-05 (×2): 20 mg via ORAL
  Filled 2020-01-02 (×3): qty 1

## 2020-01-02 MED ORDER — ATORVASTATIN CALCIUM 10 MG PO TABS
10.0000 mg | ORAL_TABLET | Freq: Every day | ORAL | Status: DC
  Administered 2020-01-03 – 2020-01-05 (×3): 10 mg via ORAL
  Filled 2020-01-02 (×3): qty 1

## 2020-01-02 MED ORDER — ACETAMINOPHEN 650 MG RE SUPP: 650.0000 mg | Freq: Four times a day (QID) | RECTAL | Status: DC | PRN

## 2020-01-02 MED ORDER — SENNA 8.6 MG PO TABS
1.0000 | ORAL_TABLET | Freq: Two times a day (BID) | ORAL | Status: DC
  Administered 2020-01-02 – 2020-01-05 (×6): 8.6 mg via ORAL
  Filled 2020-01-02 (×6): qty 1

## 2020-01-02 MED ORDER — TAMOXIFEN CITRATE 10 MG PO TABS
20.0000 mg | ORAL_TABLET | Freq: Every day | ORAL | Status: DC
  Administered 2020-01-03 – 2020-01-05 (×3): 20 mg via ORAL
  Filled 2020-01-02 (×2): qty 2

## 2020-01-02 MED ORDER — HYDROMORPHONE HCL 1 MG/ML IJ SOLN
0.5000 mg | INTRAMUSCULAR | Status: DC | PRN
  Administered 2020-01-04 (×2): 0.5 mg via INTRAVENOUS
  Filled 2020-01-02 (×2): qty 1

## 2020-01-02 MED ORDER — MORPHINE SULFATE (PF) 4 MG/ML IV SOLN: 4.0000 mg | Freq: Once | INTRAVENOUS | Status: DC

## 2020-01-02 MED ORDER — OXYCODONE-ACETAMINOPHEN 5-325 MG PO TABS
1.0000 | ORAL_TABLET | Freq: Once | ORAL | Status: AC
  Administered 2020-01-02: 1 via ORAL
  Filled 2020-01-02: qty 1

## 2020-01-02 MED ORDER — MORPHINE SULFATE (PF) 4 MG/ML IV SOLN
4.0000 mg | Freq: Once | INTRAVENOUS | Status: AC
  Administered 2020-01-02: 4 mg via INTRAVENOUS
  Filled 2020-01-02: qty 1

## 2020-01-02 MED ORDER — ONDANSETRON HCL 4 MG/2ML IJ SOLN: 4.0000 mg | Freq: Four times a day (QID) | INTRAMUSCULAR | Status: DC | PRN

## 2020-01-02 MED ORDER — ONDANSETRON HCL 4 MG PO TABS: 4.0000 mg | ORAL_TABLET | Freq: Four times a day (QID) | ORAL | Status: DC | PRN

## 2020-01-02 MED ORDER — AMLODIPINE BESYLATE 10 MG PO TABS
10.0000 mg | ORAL_TABLET | Freq: Every day | ORAL | Status: DC
  Administered 2020-01-04 – 2020-01-05 (×2): 10 mg via ORAL
  Filled 2020-01-02 (×3): qty 1

## 2020-01-02 MED ORDER — ENOXAPARIN SODIUM 40 MG/0.4ML ~~LOC~~ SOLN
40.0000 mg | SUBCUTANEOUS | Status: DC
  Administered 2020-01-02: 40 mg via SUBCUTANEOUS
  Filled 2020-01-02: qty 0.4

## 2020-01-02 MED ORDER — ACETAMINOPHEN 325 MG PO TABS
650.0000 mg | ORAL_TABLET | Freq: Four times a day (QID) | ORAL | Status: DC | PRN
  Administered 2020-01-03: 650 mg via ORAL
  Filled 2020-01-02: qty 2

## 2020-01-02 MED ORDER — LORAZEPAM 2 MG/ML IJ SOLN
0.5000 mg | Freq: Once | INTRAMUSCULAR | Status: AC
  Administered 2020-01-02: 0.5 mg via INTRAVENOUS
  Filled 2020-01-02: qty 1

## 2020-01-02 MED ORDER — HYDROCODONE-ACETAMINOPHEN 5-325 MG PO TABS
1.0000 | ORAL_TABLET | ORAL | Status: DC
  Administered 2020-01-02: 2 via ORAL
  Administered 2020-01-03 (×2): 1 via ORAL
  Administered 2020-01-03: 2 via ORAL
  Administered 2020-01-03: 1 via ORAL
  Administered 2020-01-04: 2 via ORAL
  Administered 2020-01-04: 1 via ORAL
  Administered 2020-01-04 (×3): 2 via ORAL
  Filled 2020-01-02: qty 2
  Filled 2020-01-02: qty 1
  Filled 2020-01-02: qty 2
  Filled 2020-01-02: qty 1
  Filled 2020-01-02 (×2): qty 2
  Filled 2020-01-02: qty 1
  Filled 2020-01-02 (×2): qty 2
  Filled 2020-01-02 (×3): qty 1
  Filled 2020-01-02 (×2): qty 2

## 2020-01-02 NOTE — ED Notes (Signed)
Patient states has fallen twice over the past 2-3 days and was seen by [facility] in [place] yesterday.  States she has a fracture in her back .  Presents with c/o pain to lower back.  Patient appears uncomfortable.  MAE.

## 2020-01-02 NOTE — ED Triage Notes (Signed)
Pt brought over from Orthopedics at [facility]. MD wants pt to have MRI today for back pain.

## 2020-01-02 NOTE — ED Notes (Signed)
Son's at bedside with Ilean Skill, PA-C.

## 2020-01-02 NOTE — ED Provider Notes (Signed)
[facility] Emergency Department Provider Note  ____________________________________________   First MD Initiated Contact with Patient 01/02/20 1225     (approximate)  I have reviewed the triage vital signs and the nursing notes.   HISTORY  Chief Complaint Back Pain    HPI Janice [name] is a 86 y.o. female presents emergency department complaining of lower back pain.  Patient was seen her clinical clinic earlier today was told she has a fracture.  MRI for tomorrow.  However the patient's family does not feel they can take her home.  They were unable to get her out of the car without assistance hopefully will never get her up 6 steps to go back into the home.  Medical clinic is said that there is a chance she could do the vertebroplasty.  They are here today with concerns of pain control and wanting to speed up the process for an MRI.  Patient has history of cancer.    Past Medical History:  Diagnosis Date  . 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 Colon cancer ([place]) 2003   adenocarcinoma of the ascending colon, 2007  . Environmental allergies   . High cholesterol   . Hypertension   . Large cell lymphoma ([place]) 2007   Diffuse large cell lymphoma, completed CHOP and Rituxan in March 2004  . Non Hodgkin's lymphoma ([place]) 2003  . Personal history of chemotherapy 2003   Non Hodgkins  . Skin cancer     Patient Active Problem List   Diagnosis Date Noted  . Breast mass, right   . Ductal carcinoma in situ (DCIS) of right breast 03/26/2016  . History of non-Hodgkin's lymphoma 07/19/2015  . Bronchitis 11/23/2013  . History of colon cancer 11/23/2013  . HTN (hypertension) 11/23/2013  . Hypercholesterolemia 11/23/2013    Past Surgical History:  Procedure Laterality Date  . BREAST BIOPSY Right 03/18/2016   DCIS  . BREAST BIOPSY Right 08/19/2017   ribbon marker, Fibrocystic changes- neg  . BREAST LUMPECTOMY Right 04/24/2016   DCIS clear  margins. SN biopsy-negative. Declined rad tx.   Marland Kitchen BREAST LUMPECTOMY WITH NEEDLE LOCALIZATION Right 04/24/2016   Procedure: BREAST LUMPECTOMY WITH NEEDLE LOCALIZATION;  Surgeon: Jules Husbands, MD;  Location: [facility] ORS;  Service: General;  Laterality: Right;  . COLON SURGERY  11/24/05   Cancer surgery  . COLONOSCOPY     2012  . COLONOSCOPY N/A 08/28/2015   Procedure: COLONOSCOPY;  Surgeon: Hulen Luster, MD;  Location: [facility];  Service: Gastroenterology;  Laterality: N/A;  . DIAGNOSTIC MAMMOGRAM     May 2014  . LYMPH NODE DISSECTION  2003   neck - non-hodgkin's lymphoma  . SENTINEL NODE BIOPSY Right 04/24/2016   Procedure: SENTINEL NODE BIOPSY;  Surgeon: Jules Husbands, MD;  Location: [facility] OR[facility];  Service: General;  Laterality: Right;    Prior to Admission medications   Medication Sig Start Date End Date Taking? Authorizing Provider  acetaminophen (TYLENOL) 500 MG tablet Take 500 mg by mouth every 6 (six) hours as needed.    [provider]  alendronate (FOSAMAX) 70 MG tablet Take 70 mg by mouth once a week.  01/06/19   [provider]  amLODipine (NORVASC) 10 MG tablet Take 10 mg by mouth daily.  01/06/19   [provider]  aspirin 81 MG tablet Take 81 mg by mouth daily.    [provider]  atorvastatin (LIPITOR) 10 MG tablet Take 10 mg  by mouth daily.    [provider]  lisinopril (PRINIVIL,ZESTRIL) 20 MG tablet Take 20 mg by mouth daily.    [provider]  tamoxifen (NOLVADEX) 20 MG tablet TAKE 1 TABLET DAILY 01/01/20   Lequita Asal, MD  Vitamin D, Ergocalciferol, (DRISDOL) 1.25 MG (50000 UT) CAPS capsule Take 50,000 Units by mouth every 7 (seven) days.  01/06/19   [provider]    Allergies Patient has no known allergies.  Family History  Problem Relation Age of Onset  . Breast cancer Sister 34       half sister  . Cancer Other        family history of colon cancer, breast cancer, lung cancer  . Cancer  Paternal Uncle        colon  . Colon cancer Maternal Uncle   . Stomach cancer Maternal Grandmother     Social History Social History   Tobacco Use  . Smoking status: Never Smoker  . Smokeless tobacco: Never Used  Vaping Use  . Vaping Use: Never used  Substance Use Topics  . Alcohol use: No  . Drug use: No    Review of Systems  Constitutional: No fever/chills Eyes: No visual changes. ENT: No sore throat. Respiratory: Denies cough Cardiovascular: Denies chest pain Gastrointestinal: Denies abdominal pain Genitourinary: Negative for dysuria. Musculoskeletal: Positive for back pain. Skin: Negative for rash. Psychiatric: no mood changes,     ____________________________________________   PHYSICAL EXAM:  VITAL SIGNS: ED Triage Vitals  Enc Vitals Group     BP 01/02/20 1201 (!) 163/83     Pulse Rate 01/02/20 1201 89     Resp 01/02/20 1201 20     Temp 01/02/20 1201 97.8 F (36.6 C)     Temp Source 01/02/20 1201 Oral     SpO2 01/02/20 1201 100 %     Weight 01/02/20 1202 160 lb 15 oz (73 kg)     Height 01/02/20 1202 5\' 3"  (1.6 m)     Head Circumference --      Peak Flow --      Pain Score 01/02/20 1202 10     Pain Loc --      Pain Edu? --      Excl. in [place]? --     Constitutional: Alert and oriented. Well appearing and in no acute distress. Eyes: Conjunctivae are normal.  Head: Atraumatic. Nose: No congestion/rhinnorhea. Mouth/Throat: Mucous membranes are moist.   Neck:  supple no lymphadenopathy noted Cardiovascular: Normal rate, regular rhythm. Respiratory: Normal respiratory effort.  No retractions,   Abd: soft nontender bs normal all 4 quad GU: deferred Musculoskeletal: FROM all extremities, warm and well perfused, lower spine is tender to palpation Neurologic:  Normal speech and language.  Skin:  Skin is warm, dry and intact. No rash noted. Psychiatric: Mood and affect are normal. Speech and behavior are  normal.  ____________________________________________   LABS (all labs ordered are listed, but only abnormal results are displayed)  Labs Reviewed  BASIC METABOLIC PANEL - Abnormal; Notable for the following components:      Result Value   GFR calc non Af Amer 54 (*)    All other components within normal limits  SARS CORONAVIRUS 2 BY RT PCR (HOSPITAL ORDER, [facility])  CBC WITH DIFFERENTIAL/PLATELET   ____________________________________________   ____________________________________________  RADIOLOGY  MRI of the lumbar spine and pelvis show old lumbar 2 fx, acute b/l sacral fractures  ____________________________________________   PROCEDURES  Procedure(s) performed: No  Procedures    ____________________________________________   INITIAL IMPRESSION / ASSESSMENT AND PLAN / ED COURSE  Pertinent labs & imaging results that were available during my care of the patient were reviewed by me and considered in my medical decision making (see chart for details).   Patient is an 86 year old female presents emergency department for pain control and accelerated process for an MRI.  Physical exam shows patient to be quite uncomfortable.  Lumbar spine is tender to palpation.  On review of the charts from [facility] clinic, they had ordered an MRI of the lumbar spine/pelvis for tomorrow.  I do feel that we can go ahead and do that today as I do feel the patient will need to be admitted for pain control.  CBC is normal, basic metabolic panel is normal, Covid test is pending  MRI of the lumbar spine and pelvis ordered  MRI shows old compression fracture at L2, acute fractures of the sacral bilaterally, nondisplaced  I did discuss findings with her sons and herself.  They feel like it would be best for her to stay due to the amount of pain.  Patient has been given 2 doses of pain medication without relief.  I also agree that it would be best for her to  be here and also be evaluated by physical therapy while here in the hospital.  Hospitalist is paged.'s care note sent to Rachelle Hora, PA-C of admission.  Basic labs are reassuring.  They appear to be normal.  Hospitalist is excepting care of the patient.  She is being admitted in stable condition.    Zionah Criswell Charlie was evaluated in Emergency Department on 01/02/2020 for the symptoms described in the history of present illness. She was evaluated in the context of the global COVID-19 pandemic, which necessitated consideration that the patient might be at risk for infection with the SARS-CoV-2 virus that causes COVID-19. Institutional protocols and algorithms that pertain to the evaluation of patients at risk for COVID-19 are in a state of rapid change based on information released by regulatory bodies including the CDC and federal and state organizations. These policies and algorithms were followed during the patient's care in the ED.   As part of my medical decision making, I reviewed the following data within the [place] History obtained from family, Nursing notes reviewed and incorporated, Labs reviewed , Old chart reviewed, Radiograph reviewed , Discussed with admitting physician , A consult was requested and obtained from this/these consultant(s) Orthopedics, Notes from prior ED visits and [place] Controlled Substance Database  ____________________________________________   FINAL CLINICAL IMPRESSION(S) / ED DIAGNOSES  Final diagnoses:  Closed fracture of sacrum, unspecified fracture morphology, initial encounter ([place])  Inadequate pain control      NEW MEDICATIONS STARTED DURING THIS VISIT:  New Prescriptions   No medications on file     Note:  This document was prepared using Dragon voice recognition software and may include unintentional dictation errors.    Versie Starks, PA-C 01/02/20 1629    Duffy Bruce, MD 01/04/20 [phone]

## 2020-01-02 NOTE — H&P (Signed)
History and Physical    Janice [name] FFM:[id number] DOB: [birth year] DOA: 01/02/2020  PCP: Ezequiel Kayser, MD   Patient coming from: Home  I have personally briefly reviewed patient's old medical records in [place]  Chief Complaint: Worsening back pain and multiple falls.  HPI: Janice [name] is a 86 y.o. female with medical history significant of hypertension, non-Hodgkin lymphoma, skin cancer, adenocarcinoma of colon and DCIS of right breast was brought to ED from orthopedic office for lumbar spinal and pelvic MRI and pain control.  Patient was seen at orthopedic clinic today with complaint of multiple falls over the past few days.  Worsening back pain.  Family is unable to take care of her at home due to limited mobility secondary to pain. According to patient she was experiencing worsening back pain and thinks that she was having falls secondary to her legs giving away.  Denies any dizziness or lightheadedness.  Denies any chest pain or shortness of breath.  No fever or chills.  No recent illnesses.  No sick contacts.  Denies any urinary symptoms.  No nausea or vomiting or diarrhea.  ED Course: Hemodynamically stable, with blood pressure of 163/83, unremarkable labs.  MRI pelvis with acute or subacute bilateral sacral fractures which appears nondisplaced.  Small volume of fluid in the left trochanteric bursa, patible with bursitis.  MRI without any acute lumbar fracture.  Old healed L2 fracture.  Chronic facet arthropathy with a possible right L4 nerve compression in the foreman.  Bilateral foraminal stenosis which could affect exiting L5 nerves. [facility] was consulted for pain control admission.  Review of Systems: As per HPI otherwise 10 point review of systems negative.   Past Medical History:  Diagnosis Date  . Breast cancer ([place]) 2017   DCIS of right breast  . Colon cancer ([place]) 2003   adenocarcinoma of the ascending colon, 2007  . Environmental allergies   . High cholesterol   .  Hypertension   . Large cell lymphoma ([place]) 2007   Diffuse large cell lymphoma, completed CHOP and Rituxan in March 2004  . Non Hodgkin's lymphoma ([place]) 2003  . Personal history of chemotherapy 2003   Non Hodgkins  . Skin cancer     Past Surgical History:  Procedure Laterality Date  . BREAST BIOPSY Right 03/18/2016   DCIS  . BREAST BIOPSY Right 08/19/2017   ribbon marker, Fibrocystic changes- neg  . BREAST LUMPECTOMY Right 04/24/2016   DCIS clear margins. SN biopsy-negative. Declined rad tx.   Marland Kitchen BREAST LUMPECTOMY WITH NEEDLE LOCALIZATION Right 04/24/2016   Procedure: BREAST LUMPECTOMY WITH NEEDLE LOCALIZATION;  Surgeon: Jules Husbands, MD;  Location: [facility];  Service: General;  Laterality: Right;  . COLON SURGERY  11/24/05   Cancer surgery  . COLONOSCOPY     2012  . COLONOSCOPY N/A 08/28/2015   Procedure: COLONOSCOPY;  Surgeon: Hulen Luster, MD;  Location: [facility];  Service: Gastroenterology;  Laterality: N/A;  . DIAGNOSTIC MAMMOGRAM     May 2014  . LYMPH NODE DISSECTION  2003   neck - non-hodgkin's lymphoma  . SENTINEL NODE BIOPSY Right 04/24/2016   Procedure: SENTINEL NODE BIOPSY;  Surgeon: Jules Husbands, MD;  Location: [facility];  Service: General;  Laterality: Right;     reports that she has never smoked. She has never used smokeless tobacco. She reports that she does not drink alcohol and does not use drugs.  No Known Allergies  Family History  Problem Relation Age of  Onset  . Breast cancer Sister 36       half sister  . Cancer Other        family history of colon cancer, breast cancer, lung cancer  . Cancer Paternal Uncle        colon  . Colon cancer Maternal Uncle   . Stomach cancer Maternal Grandmother     Prior to Admission medications   Medication Sig Start Date End Date Taking? Authorizing Provider  acetaminophen (TYLENOL) 500 MG tablet Take 500 mg by mouth every 6 (six) hours as needed.    [provider]  alendronate (FOSAMAX) 70 MG tablet  Take 70 mg by mouth once a week.  01/06/19   [provider]  amLODipine (NORVASC) 10 MG tablet Take 10 mg by mouth daily.  01/06/19   [provider]  aspirin 81 MG tablet Take 81 mg by mouth daily.    [provider]  atorvastatin (LIPITOR) 10 MG tablet Take 10 mg by mouth daily.    [provider]  lisinopril (PRINIVIL,ZESTRIL) 20 MG tablet Take 20 mg by mouth daily.    [provider]  tamoxifen (NOLVADEX) 20 MG tablet TAKE 1 TABLET DAILY 01/01/20   Lequita Asal, MD  Vitamin D, Ergocalciferol, (DRISDOL) 1.25 MG (50000 UT) CAPS capsule Take 50,000 Units by mouth every 7 (seven) days.  01/06/19   [provider]    Physical Exam: Vitals:   01/02/20 1201 01/02/20 1202  BP: (!) 163/83   Pulse: 89   Resp: 20   Temp: 97.8 F (36.6 C)   TempSrc: Oral   SpO2: 100%   Weight:  73 kg  Height:  5\' 3"  (1.6 m)    General: Vital signs reviewed.  Patient is well-developed and well-nourished, in no acute distress and cooperative with exam.  Head: Normocephalic and atraumatic. Eyes: EOMI, conjunctivae normal, no scleral icterus.  ENMT: Mucous membranes are moist.  Neck: Supple, trachea midline, normal ROM,  Cardiovascular: RRR, S1 normal, S2 normal, no murmurs, gallops, or rubs. Pulmonary/Chest: Clear to auscultation bilaterally, no wheezes, rales, or rhonchi. Abdominal: Soft, non-tender, non-distended, BS +, Extremities: No lower extremity edema bilaterally,  pulses symmetric and intact bilaterally. No cyanosis or clubbing. Neurological: A&O x3, Strength is normal and symmetric bilaterally, cranial nerve II-XII are grossly intact, no focal motor deficit, sensory intact to light touch bilaterally.  Psychiatric: Normal mood and affect.   Labs on Admission: I have personally reviewed following labs and imaging studies  CBC: Recent Labs  Lab 01/02/20 1357  WBC 6.4  NEUTROABS 4.7  HGB 13.1  HCT 39.4  MCV 93.4  PLT 381   Basic  Metabolic Panel: Recent Labs  Lab 01/02/20 1357  NA 137  K 4.0  CL 99  CO2 24  GLUCOSE 98  BUN 18  CREATININE 0.93  CALCIUM 9.2   GFR: Estimated Creatinine Clearance: 39.2 mL/min (by C-G formula based on SCr of 0.93 mg/dL). Liver Function Tests: No results for input(s): AST, ALT, ALKPHOS, BILITOT, PROT, ALBUMIN in the last 168 hours. No results for input(s): LIPASE, AMYLASE in the last 168 hours. No results for input(s): AMMONIA in the last 168 hours. Coagulation Profile: No results for input(s): INR, PROTIME in the last 168 hours. Cardiac Enzymes: No results for input(s): CKTOTAL, CKMB, CKMBINDEX, TROPONINI in the last 168 hours. BNP (last 3 results) No results for input(s): PROBNP in the last 8760 hours. HbA1C: No results for input(s): HGBA1C in the last 72 hours. CBG:  No results for input(s): GLUCAP in the last 168 hours. Lipid Profile: No results for input(s): CHOL, HDL, LDLCALC, TRIG, CHOLHDL, LDLDIRECT in the last 72 hours. Thyroid Function Tests: No results for input(s): TSH, T4TOTAL, FREET4, T3FREE, THYROIDAB in the last 72 hours. Anemia Panel: No results for input(s): VITAMINB12, FOLATE, FERRITIN, TIBC, IRON, RETICCTPCT in the last 72 hours. Urine analysis:    Component Value Date/Time   COLORURINE YELLOW 05/18/2014 1053   APPEARANCEUR CLOUDY 05/18/2014 1053   LABSPEC 1.020 05/18/2014 1053   PHURINE 5.5 05/18/2014 [place] 05/18/2014 [place] 05/18/2014 [place] 05/18/2014 [place] 05/18/2014 1053   PROTEINUR 100 mg/dL 05/18/2014 1053   NITRITE NEGATIVE 05/18/2014 1053   LEUKOCYTESUR MODERATE 05/18/2014 1053    Radiological Exams on Admission: MR LUMBAR SPINE WO CONTRAST  Result Date: 01/02/2020 CLINICAL DATA:  Low back pain.  Recent falling. EXAM: MRI LUMBAR SPINE WITHOUT CONTRAST TECHNIQUE: Multiplanar, multisequence MR imaging of the lumbar spine was performed. No intravenous contrast was  administered. COMPARISON:  None. FINDINGS: Segmentation:  5 lumbar type vertebral bodies. Alignment: 3 mm degenerative anterolisthesis L4-5. 11 mm anterolisthesis L5-S1 due to chronic facet arthropathy and possibly a left-sided pars defect. Vertebrae: Old compression deformity at L2. Recent sacral fracture, described completely by the pelvic MRI. Conus medullaris and cauda equina: Conus extends to the L1-2 level. Conus and cauda equina appear normal. Paraspinal and other soft tissues: Negative Disc levels: L1-2: Shallow disc protrusion indents the thecal sac slightly but does not cause compressive stenosis. L2-3: Normal L3-4: Minimal noncompressive disc bulge.  Mild facet osteoarthritis. L4-5: Chronic facet osteoarthritis with 3 mm of anterolisthesis. Bulging of the disc. Stenosis of the lateral recesses and neural foramina. Potential for neural compression in the intervertebral foramen on the right. L5-S1: 11 mm anterolisthesis. Chronic disc degeneration with loss of disc height. There appears to be chronic facet arthropathy on both sides. Question pars defect on the left. No compressive central canal stenosis. Bilateral foraminal narrowing could affect the exiting L5 nerves. IMPRESSION: Sacral fracture. See results of pelvic MRI. No acute lumbar fracture. Old healed fracture deformity at L2. L4-5: Chronic facet arthropathy with 3 mm of anterolisthesis. Bulging of the disc. Stenosis of the lateral recesses and foramina right more than left. In particular, the right L4 nerve could be compressed in the foramen. L5-S1: Chronic facet arthropathy and possibly pars defect on the left. Anterolisthesis of 11 mm. Chronic disc degeneration. Bilateral foraminal stenosis could affect the exiting L5 nerves. Electronically Signed   By: Nelson Chimes M.D.   On: 01/02/2020 15:32   MR PELVIS WO CONTRAST  Result Date: 01/02/2020 CLINICAL DATA:  Status post fall x2 over the past 2-3 days. Low back pain. Initial encounter. EXAM:  MRI PELVIS WITHOUT CONTRAST TECHNIQUE: Multiplanar, multisequence MR imaging of the pelvis was performed. No intravenous contrast was administered. COMPARISON:  None. FINDINGS: Bones/Joint/Cartilage Marrow edema in the sacrum due to bilateral acute or early subacute sacral fractures is identified. Edema is slightly worse in the right sacrum. No other fracture is seen. The patient has bone-on-bone right hip joint space narrowing with osteophytosis about the right femoral head and subchondral edema in the superior aspect of the femoral head. Lower lumbar degenerative change noted. No joint effusion. Ligaments Intact. Muscles and Tendons No strain or tear is identified. Atrophy of gluteal musculature noted. Small volume of fluid in the left trochanteric bursa is seen. Soft tissues No acute or  focal abnormality is seen within the pelvis. IMPRESSION: Findings consistent with acute or subacute bilateral sacral fractures which appear nondisplaced. Advanced right hip osteoarthritis. Small volume of fluid in the left trochanteric bursa compatible with bursitis. Electronically Signed   By: Inge Rise M.D.   On: 01/02/2020 15:41   Assessment/Plan Active Problems:   Closed sacral fracture (HCC)   Bilateral sacral fractures with fall.  Patient is being admitted for pain control. -Norco every 4 hourly. -Dilaudid 0.5 mg every 6 hourly as needed for breakthrough pain. -PT/OT evaluation. -Orthopedic was consulted by ED-we will appreciate their recommendations.  Hypertension. -Continue home dose of amlodipine and lisinopril.  History of DCIS of breast. -Continue home dose of tamoxifen.  Dyslipidemia. -Continue home dose of Lipitor. -Hold aspirin for a possible procedure by orthopedic.  We will restart if they decided conservative management.  DVT prophylaxis: Lovenox Code Status: Full code Family Communication: 2 sons were updated at bedside. Disposition Plan: To be determined, might go to  rehab. Consults called: Orthopedic Admission status: Inpatient   [name] MD [facility]  If 7PM-7AM, please contact night-coverage [email or web address]  01/02/2020, 5:17 PM   This record has been created using Systems analyst. Errors have been sought and corrected,but may not always be located. Such creation errors do not reflect on the standard of care.

## 2020-01-02 NOTE — ED Notes (Signed)
Attempted to call report to floor 

## 2020-01-03 ENCOUNTER — Ambulatory Visit: Payer: Medicare Other

## 2020-01-03 MED ORDER — CEFAZOLIN SODIUM-DEXTROSE 1-4 GM/50ML-% IV SOLN
1.0000 g | Freq: Once | INTRAVENOUS | Status: AC
  Administered 2020-01-04: 1 g via INTRAVENOUS
  Filled 2020-01-03: qty 50

## 2020-01-03 NOTE — Progress Notes (Addendum)
Patient arrived to floor  On stretcher with nursing staff and 2 son/HCPOAs, Vicente Serene and Jenny Reichmann, at bedside. Some ecchymosis noted on left thigh/hip and excoriation on stomach, but no other wounds noted.

## 2020-01-03 NOTE — Progress Notes (Signed)
PROGRESS NOTE    Janice [name]  JGG:[id number] DOB: [birth year] DOA: 01/02/2020 PCP: Ezequiel Kayser, MD   Brief Narrative:  Janice [name] is a 86 y.o. female with medical history significant of hypertension, non-Hodgkin lymphoma, skin cancer, adenocarcinoma of colon and DCIS of right breast was brought to ED from orthopedic office for lumbar spinal and pelvic MRI and pain control.  Patient was seen at orthopedic clinic today with complaint of multiple falls over the past few days.  Worsening back pain.  Family is unable to take care of her at home due to limited mobility secondary to pain. MRI pelvis with bilateral sacral fractures.  Going for sacral plasty tomorrow with orthopedic.  Subjective: Patient was feeling little better when seen today.  Stating that her pain is controlled with scheduled pain medications.  Son was present in the room.  Assessment & Plan:   Active Problems:   Closed sacral fracture (HCC)  Bilateral sacral fractures with fall.  Patient is being admitted for pain control.  Pain is well controlled under current regimen -Continue Norco every 4 hourly. -Dilaudid 0.5 mg every 6 hourly as needed for breakthrough pain. -PT/OT evaluation. -Orthopedic was consulted by ED-patient is going for sacral plasty tomorrow.  Hypertension.  Pressure within goal. -Continue home dose of amlodipine and lisinopril.  History of DCIS of breast. -Continue home dose of tamoxifen.  Dyslipidemia. -Continue home dose of Lipitor. -Hold aspirin for sacral plasty by orthopedic.  We will restart after the procedure.  Objective: Vitals:   01/02/20 2355 01/03/20 0515 01/03/20 0724 01/03/20 0932  BP: (!) 148/55 110/69 (!) 112/58   Pulse: (!) 107 83 88   Resp: 19 20 18    Temp: 98.7 F (37.1 C) 97.7 F (36.5 C) 97.9 F (36.6 C)   TempSrc:  Oral Oral   SpO2: 91% 97% 97% 93%  Weight:      Height:        Intake/Output Summary (Last 24 hours) at 01/03/2020 1535 Last data filed at 01/03/2020  1422 Gross per 24 hour  Intake 240 ml  Output 0 ml  Net 240 ml   Filed Weights   01/02/20 1202  Weight: 73 kg    Examination:  General exam: Appears calm and comfortable  Respiratory system: Clear to auscultation. Respiratory effort normal. Cardiovascular system: S1 & S2 heard, RRR. No JVD, +ve murmurs. Gastrointestinal system: Soft, nontender, nondistended, bowel sounds positive. Central nervous system: Alert and oriented. No focal neurological deficits. Extremities: No edema, no cyanosis, pulses intact and symmetrical. Psychiatry: Judgement and insight appear normal. Mood & affect appropriate.    DVT prophylaxis: Lovenox Code Status: Full Family Communication: Son was updated at bedside Disposition Plan:  Status is: Inpatient  Remains inpatient appropriate because:Inpatient level of care appropriate due to severity of illness   Dispo: The patient is from: Home              Anticipated d/c is to: To be determined              Anticipated d/c date is: 2 days              Patient currently is not medically stable to d/c.  Ongoing pain management.  Going for sacral plasty tomorrow.  Consultants:   Orthopedic  Procedures:  Antimicrobials:   Data Reviewed: I have personally reviewed following labs and imaging studies  CBC: Recent Labs  Lab 01/02/20 1357  WBC 6.4  NEUTROABS 4.7  HGB 13.1  HCT 39.4  MCV 93.4  PLT 161   Basic Metabolic Panel: Recent Labs  Lab 01/02/20 1357  NA 137  K 4.0  CL 99  CO2 24  GLUCOSE 98  BUN 18  CREATININE 0.93  CALCIUM 9.2   GFR: Estimated Creatinine Clearance: 39.2 mL/min (by C-G formula based on SCr of 0.93 mg/dL). Liver Function Tests: No results for input(s): AST, ALT, ALKPHOS, BILITOT, PROT, ALBUMIN in the last 168 hours. No results for input(s): LIPASE, AMYLASE in the last 168 hours. No results for input(s): AMMONIA in the last 168 hours. Coagulation Profile: No results for input(s): INR, PROTIME in the last 168  hours. Cardiac Enzymes: No results for input(s): CKTOTAL, CKMB, CKMBINDEX, TROPONINI in the last 168 hours. BNP (last 3 results) No results for input(s): PROBNP in the last 8760 hours. HbA1C: No results for input(s): HGBA1C in the last 72 hours. CBG: No results for input(s): GLUCAP in the last 168 hours. Lipid Profile: No results for input(s): CHOL, HDL, LDLCALC, TRIG, CHOLHDL, LDLDIRECT in the last 72 hours. Thyroid Function Tests: No results for input(s): TSH, T4TOTAL, FREET4, T3FREE, THYROIDAB in the last 72 hours. Anemia Panel: No results for input(s): VITAMINB12, FOLATE, FERRITIN, TIBC, IRON, RETICCTPCT in the last 72 hours. Sepsis Labs: No results for input(s): PROCALCITON, LATICACIDVEN in the last 168 hours.  Recent Results (from the past 240 hour(s))  SARS Coronavirus 2 by RT PCR (hospital order, performed in [facility] hospital lab) Nasopharyngeal Nasopharyngeal Swab     Status: None   Collection Time: 01/02/20  1:57 PM   Specimen: Nasopharyngeal Swab  Result Value Ref Range Status   SARS Coronavirus 2 NEGATIVE NEGATIVE Final    Comment: (NOTE) SARS-CoV-2 target nucleic acids are NOT DETECTED.  The SARS-CoV-2 RNA is generally detectable in upper and lower respiratory specimens during the acute phase of infection. The lowest concentration of SARS-CoV-2 viral copies this assay can detect is 250 copies / mL. A negative result does not preclude SARS-CoV-2 infection and should not be used as the sole basis for treatment or other patient management decisions.  A negative result may occur with improper specimen collection / handling, submission of specimen other than nasopharyngeal swab, presence of viral mutation(s) within the areas targeted by this assay, and inadequate number of viral copies (<250 copies / mL). A negative result must be combined with clinical observations, patient history, and epidemiological information.  Fact Sheet for Patients:     StrictlyIdeas.no  Fact Sheet for Healthcare Providers: BankingDealers.co.za  This test is not yet approved or  cleared by the [place] FDA and has been authorized for detection and/or diagnosis of SARS-CoV-2 by FDA under an Emergency Use Authorization (EUA).  This EUA will remain in effect (meaning this test can be used) for the duration of the COVID-19 declaration under Section 564(b)(1) of the Act, 21 U.S.C. section 360bbb-3(b)(1), unless the authorization is terminated or revoked sooner.  Performed at [facility], [address] [place], [place] [address]      Radiology Studies: MR LUMBAR SPINE WO CONTRAST  Result Date: 01/02/2020 CLINICAL DATA:  Low back pain.  Recent falling. EXAM: MRI LUMBAR SPINE WITHOUT CONTRAST TECHNIQUE: Multiplanar, multisequence MR imaging of the lumbar spine was performed. No intravenous contrast was administered. COMPARISON:  None. FINDINGS: Segmentation:  5 lumbar type vertebral bodies. Alignment: 3 mm degenerative anterolisthesis L4-5. 11 mm anterolisthesis L5-S1 due to chronic facet arthropathy and possibly a left-sided pars defect. Vertebrae: Old compression deformity at L2. Recent sacral fracture, described completely  by the pelvic MRI. Conus medullaris and cauda equina: Conus extends to the L1-2 level. Conus and cauda equina appear normal. Paraspinal and other soft tissues: Negative Disc levels: L1-2: Shallow disc protrusion indents the thecal sac slightly but does not cause compressive stenosis. L2-3: Normal L3-4: Minimal noncompressive disc bulge.  Mild facet osteoarthritis. L4-5: Chronic facet osteoarthritis with 3 mm of anterolisthesis. Bulging of the disc. Stenosis of the lateral recesses and neural foramina. Potential for neural compression in the intervertebral foramen on the right. L5-S1: 11 mm anterolisthesis. Chronic disc degeneration with loss of disc height. There appears to be  chronic facet arthropathy on both sides. Question pars defect on the left. No compressive central canal stenosis. Bilateral foraminal narrowing could affect the exiting L5 nerves. IMPRESSION: Sacral fracture. See results of pelvic MRI. No acute lumbar fracture. Old healed fracture deformity at L2. L4-5: Chronic facet arthropathy with 3 mm of anterolisthesis. Bulging of the disc. Stenosis of the lateral recesses and foramina right more than left. In particular, the right L4 nerve could be compressed in the foramen. L5-S1: Chronic facet arthropathy and possibly pars defect on the left. Anterolisthesis of 11 mm. Chronic disc degeneration. Bilateral foraminal stenosis could affect the exiting L5 nerves. Electronically Signed   By: Nelson Chimes M.D.   On: 01/02/2020 15:32   MR PELVIS WO CONTRAST  Result Date: 01/02/2020 CLINICAL DATA:  Status post fall x2 over the past 2-3 days. Low back pain. Initial encounter. EXAM: MRI PELVIS WITHOUT CONTRAST TECHNIQUE: Multiplanar, multisequence MR imaging of the pelvis was performed. No intravenous contrast was administered. COMPARISON:  None. FINDINGS: Bones/Joint/Cartilage Marrow edema in the sacrum due to bilateral acute or early subacute sacral fractures is identified. Edema is slightly worse in the right sacrum. No other fracture is seen. The patient has bone-on-bone right hip joint space narrowing with osteophytosis about the right femoral head and subchondral edema in the superior aspect of the femoral head. Lower lumbar degenerative change noted. No joint effusion. Ligaments Intact. Muscles and Tendons No strain or tear is identified. Atrophy of gluteal musculature noted. Small volume of fluid in the left trochanteric bursa is seen. Soft tissues No acute or focal abnormality is seen within the pelvis. IMPRESSION: Findings consistent with acute or subacute bilateral sacral fractures which appear nondisplaced. Advanced right hip osteoarthritis. Small volume of fluid in the  left trochanteric bursa compatible with bursitis. Electronically Signed   By: Inge Rise M.D.   On: 01/02/2020 15:41    Scheduled Meds:  amLODipine  10 mg Oral Daily   atorvastatin  10 mg Oral Daily   HYDROcodone-acetaminophen  1-2 tablet Oral Q4H   lisinopril  20 mg Oral Daily   senna  1 tablet Oral BID   tamoxifen  20 mg Oral Daily   Continuous Infusions:  [START ON 01/04/2020]  ceFAZolin (ANCEF) IV       LOS: 1 day   Time spent: 40 minutes.  [name], MD [facility]  If 7PM-7AM, please contact night-coverage [email or web address]  01/03/2020, 3:35 PM   This record has been created using Systems analyst. Errors have been sought and corrected,but may not always be located. Such creation errors do not reflect on the standard of care.

## 2020-01-03 NOTE — Evaluation (Signed)
Occupational Therapy Evaluation Patient Details Name: Janice [name] MRN: [id number] DOB: 03-08-1929 Today's Date: 01/03/2020    History of Present Illness Janice [name] is a 86 year old female with medical history significant of hypertension, non-Hodgkin lymphoma, skin cancer, adenocarcinoma of colon and DCIS of right breast was brought to ED from orthopedic office for lumbar spinal and pelvic MRI and pain control.  Patient was seen at orthopedic clinic today with complaint of multiple falls over the past few days.  Worsening back pain.  Family is unable to take care of her at home due to limited mobility secondary to pain.MRI pelvis with bilateral sacral fractures.  Going for sacral plasty tomorrow with orthopedic.   Clinical Impression   Ms. Rule presents to OT with pain, generalized weakness, and limited endurance that impacts her ability to safely and independently complete functional tasks.  Prior to a couple weeks ago, pt was grossly mod I in ADLs/IADLs within the home using RW or cane for mobility.  Pt became limited by back pain and BLE weakness a couple weeks ago and has been essentially bedbound since then.  [name] denied back pain in today's evaluation, but she was extremely limited by pain/the feeling of needing to urinate.  OTR provided min assist for sit <> supine using logroll technique for BLE management.  Pt unable to sit fully upright 2/2 pelvic pain.  Suspect pt requires grossly min assist for seated ADLs including feeding, grooming, upper body dressing, and upper body bathing.  Unable to assess functional mobility this date.  [name] will continue to benefit from skilled OT services in acute setting to address pain, functional strengthening, and safety and independence in ADLs.  Recommend SNF upon discharge at this time.    Follow Up Recommendations  SNF    Equipment Recommendations  Other (comment) (TBD)    Recommendations for Other Services       Precautions / Restrictions  Precautions Precautions: Fall Restrictions Weight Bearing Restrictions: No      Mobility Bed Mobility Overal bed mobility: Needs Assistance Bed Mobility: Rolling;Sidelying to Sit;Sit to Sidelying Rolling: Min guard Sidelying to sit: Min assist     Sit to sidelying: Min assist General bed mobility comments: min A provided for BLE management in logroll technique for supine <> sit  Transfers Overall transfer level: Needs assistance               General transfer comment: unable to assess 2/2 pt pain    Balance Overall balance assessment: Needs assistance Sitting-balance support: Feet supported;Single extremity supported Sitting balance-Leahy Scale: Poor         Standing balance comment: unable to assess                           ADL either performed or assessed with clinical judgement   ADL Overall ADL's : Needs assistance/impaired                                       General ADL Comments: Unable to gain full ADL assessment 2/2 pt pain, but suspect pt requires grossly min assist for seated ADLs including feeding, grooming, upper body dressing, and bathing.  Pt able to don/doff footwear while seated EOB.  Pt requires min A for bed mobility, but unable to assess OOB mobility for functional transfers, toileting, and lower body dressing.  Vision Baseline Vision/History: Wears glasses;Cataracts Wears Glasses: At all times Patient Visual Report: No change from baseline (pt reports need for cataract surgery in R eye at baseline, no acute changes)       Perception     Praxis      Pertinent Vitals/Pain Pain Assessment: Faces Faces Pain Scale: Hurts even more Pain Location: pt denies sacral pain, but is limited by pain/feeling of needing to urinate Pain Descriptors / Indicators: Constant;Grimacing;Discomfort;Moaning Pain Intervention(s): Limited activity within patient's tolerance;Monitored during session;Utilized relaxation techniques      Hand Dominance Right   Extremity/Trunk Assessment Upper Extremity Assessment Upper Extremity Assessment: Overall WFL for tasks assessed   Lower Extremity Assessment Lower Extremity Assessment: Defer to PT evaluation       Communication Communication Communication: No difficulties   Cognition Arousal/Alertness: Awake/alert Behavior During Therapy: Restless Overall Cognitive Status: Within Functional Limits for tasks assessed                                 General Comments: Pt restless/distracted by feeling of needing to urinate, unable to fully engage with therapy because of this pain   General Comments       Exercises Other Exercises Other Exercises: provided education re: OT role and plan of care, fall and safety precautions, self care, bed mobility, pain management   Shoulder Instructions      Home Living Family/patient expects to be discharged to:: Private residence Living Arrangements: Children Available Help at Discharge: Available 24 hours/day Type of Home: House                 Bathroom Toilet: Standard     Home Equipment: Bedside commode;Walker - 2 wheels;Cane - single point          Prior Functioning/Environment Level of Independence: Needs assistance  Gait / Transfers Assistance Needed: Pt able to ambulate in house with RW or cane, has not been walking outside 2/2 fear of uneven terrain ADL's / Homemaking Assistance Needed: Pt generally mod I in ADLs/household IADLs including bathing, dressing, simple meal prep, and medication management   Comments: Pt reports she has not driven since early 6433.  Up until a couple weeks ago, she was generally mod I for ADLs/IADLs inside the house using RW or cane.  Her pain and BLE weakness have caused her to be generally bedbound for the past two weeks.        OT Problem List: Decreased strength;Decreased range of motion;Decreased activity tolerance;Impaired balance (sitting and/or  standing);Decreased knowledge of use of DME or AE;Decreased knowledge of precautions;Pain      OT Treatment/Interventions: Self-care/ADL training;Therapeutic exercise;Energy conservation;DME and/or AE instruction;Therapeutic activities;Patient/family education;Balance training    OT Goals(Current goals can be found in the care plan section) Acute Rehab OT Goals Patient Stated Goal: "to manage pain and be independent again" OT Goal Formulation: With patient/family Time For Goal Achievement: 01/17/20 Potential to Achieve Goals: Good  OT Frequency: Min 2X/week   Barriers to D/C: Decreased caregiver support  family at risk for injury if providing current level of care required for this pt       Co-evaluation              AM-PAC OT "6 Clicks" Daily Activity     Outcome Measure Help from another person eating meals?: None Help from another person taking care of personal grooming?: A Little Help from another person toileting, which includes using toliet,  bedpan, or urinal?: Total Help from another person bathing (including washing, rinsing, drying)?: A Lot Help from another person to put on and taking off regular upper body clothing?: None Help from another person to put on and taking off regular lower body clothing?: A Lot 6 Click Score: 16   End of Session    Activity Tolerance: Patient limited by pain Patient left: in bed;with call bell/phone within reach;with bed alarm set;with family/visitor present  OT Visit Diagnosis: Other abnormalities of gait and mobility (R26.89);Repeated falls (R29.6);Pain;Muscle weakness (generalized) (M62.81) Pain - part of body:  (back)                Time: 6283-1517 OT Time Calculation (min): 22 min Charges:  OT General Charges $OT Visit: 1 Visit OT Evaluation $OT Eval Moderate Complexity: 1 Mod OT Treatments $Therapeutic Activity: 8-22 mins  Myrtie Hawk Natacia Chaisson, OTR/L 01/03/20, 4:41 PM

## 2020-01-03 NOTE — Consult Note (Signed)
Reason for Consult: Sacral insufficiency fractures Referring Physician: Dr. Tasia Catchings Janice [name] is an 86 y.o. female.  HPI: Patient was brought to ED from orthopedic office for lumbar spinal and pelvic MRI and pain control.  Patient was seen at orthopedic clinic today with complaint of multiple falls over the past few days.  Worsening back pain.  Family is unable to take care of her at home due to limited mobility secondary to pain. According to patient she was experiencing worsening back pain and thinks that she was having falls secondary to her legs giving away.  Denies any dizziness or lightheadedness.  Denies any chest pain or shortness of breath.  No fever or chills.  No recent illnesses.  No sick contacts.  Denies any urinary symptoms.  No nausea or vomiting or diarrhea.  Past Medical History:  Diagnosis Date  . 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 Colon cancer ([place]) 2003   adenocarcinoma of the ascending colon, 2007  . Environmental allergies   . High cholesterol   . Hypertension   . Large cell lymphoma ([place]) 2007   Diffuse large cell lymphoma, completed CHOP and Rituxan in March 2004  . Non Hodgkin's lymphoma ([place]) 2003  . Personal history of chemotherapy 2003   Non Hodgkins  . Skin cancer     Past Surgical History:  Procedure Laterality Date  . BREAST BIOPSY Right 03/18/2016   DCIS  . BREAST BIOPSY Right 08/19/2017   ribbon marker, Fibrocystic changes- neg  . BREAST LUMPECTOMY Right 04/24/2016   DCIS clear margins. SN biopsy-negative. Declined rad tx.   Marland Kitchen BREAST LUMPECTOMY WITH NEEDLE LOCALIZATION Right 04/24/2016   Procedure: BREAST LUMPECTOMY WITH NEEDLE LOCALIZATION;  Surgeon: Jules Husbands, MD;  Location: [facility];  Service: General;  Laterality: Right;  . COLON SURGERY  11/24/05   Cancer surgery  . COLONOSCOPY     2012  . COLONOSCOPY N/A 08/28/2015   Procedure: COLONOSCOPY;  Surgeon: Hulen Luster, MD;  Location: [facility];  Service: Gastroenterology;   Laterality: N/A;  . DIAGNOSTIC MAMMOGRAM     May 2014  . LYMPH NODE DISSECTION  2003   neck - non-hodgkin's lymphoma  . SENTINEL NODE BIOPSY Right 04/24/2016   Procedure: SENTINEL NODE BIOPSY;  Surgeon: Jules Husbands, MD;  Location: [facility];  Service: General;  Laterality: Right;    Family History  Problem Relation Age of Onset  . Breast cancer Sister 42       half sister  . Cancer Other        family history of colon cancer, breast cancer, lung cancer  . Cancer Paternal Uncle        colon  . Colon cancer Maternal Uncle   . Stomach cancer Maternal Grandmother     Social History:  reports that she has never smoked. She has never used smokeless tobacco. She reports that she does not drink alcohol and does not use drugs.  Allergies: No Known Allergies  Medications: I have reviewed the patient's current medications.  Results for orders placed or performed during the hospital encounter of 01/02/20 (from the past 48 hour(s))  Basic metabolic panel     Status: Abnormal   Collection Time: 01/02/20  1:57 PM  Result Value Ref Range   Sodium 137 135 - 145 mmol/L   Potassium 4.0 3.5 - 5.1 mmol/L    Comment: HEMOLYSIS AT THIS LEVEL MAY AFFECT RESULT   Chloride 99 98 -  111 mmol/L   CO2 24 22 - 32 mmol/L   Glucose, Bld 98 70 - 99 mg/dL    Comment: Glucose reference range applies only to samples taken after fasting for at least 8 hours.   BUN 18 8 - 23 mg/dL   Creatinine, Ser 0.93 0.44 - 1.00 mg/dL   Calcium 9.2 8.9 - 10.3 mg/dL   GFR calc non Af Amer 54 (L) >60 mL/min   GFR calc Af Amer >60 >60 mL/min   Anion gap 14 5 - 15    Comment: Performed at [facility], [address] [place], [place] [address]  CBC with Differential     Status: None   Collection Time: 01/02/20  1:57 PM  Result Value Ref Range   WBC 6.4 4.0 - 10.5 K/uL   RBC 4.22 3.87 - 5.11 MIL/uL   Hemoglobin 13.1 12.0 - 15.0 g/dL   HCT 39.4 36 - 46 %   MCV 93.4 80.0 - 100.0 fL   MCH 31.0 26.0 - 34.0 pg    MCHC 33.2 30.0 - 36.0 g/dL   RDW 13.0 11.5 - 15.5 %   Platelets 217 150 - 400 K/uL   nRBC 0.0 0.0 - 0.2 %   Neutrophils Relative % 73 %   Neutro Abs 4.7 1.7 - 7.7 K/uL   Lymphocytes Relative 15 %   Lymphs Abs 0.9 0.7 - 4.0 K/uL   Monocytes Relative 9 %   Monocytes Absolute 0.6 0 - 1 K/uL   Eosinophils Relative 1 %   Eosinophils Absolute 0.1 0 - 0 K/uL   Basophils Relative 1 %   Basophils Absolute 0.0 0 - 0 K/uL   Immature Granulocytes 1 %   Abs Immature Granulocytes 0.03 0.00 - 0.07 K/uL    Comment: Performed at [facility], [address] [place], [place] [address]  SARS Coronavirus 2 by RT PCR (hospital order, performed in [facility] hospital lab) Nasopharyngeal Nasopharyngeal Swab     Status: None   Collection Time: 01/02/20  1:57 PM   Specimen: Nasopharyngeal Swab  Result Value Ref Range   SARS Coronavirus 2 NEGATIVE NEGATIVE    Comment: (NOTE) SARS-CoV-2 target nucleic acids are NOT DETECTED.  The SARS-CoV-2 RNA is generally detectable in upper and lower respiratory specimens during the acute phase of infection. The lowest concentration of SARS-CoV-2 viral copies this assay can detect is 250 copies / mL. A negative result does not preclude SARS-CoV-2 infection and should not be used as the sole basis for treatment or other patient management decisions.  A negative result may occur with improper specimen collection / handling, submission of specimen other than nasopharyngeal swab, presence of viral mutation(s) within the areas targeted by this assay, and inadequate number of viral copies (<250 copies / mL). A negative result must be combined with clinical observations, patient history, and epidemiological information.  Fact Sheet for Patients:   StrictlyIdeas.no  Fact Sheet for Healthcare Providers: BankingDealers.co.za  This test is not yet approved or  cleared by the [place] FDA and has been authorized  for detection and/or diagnosis of SARS-CoV-2 by FDA under an Emergency Use Authorization (EUA).  This EUA will remain in effect (meaning this test can be used) for the duration of the COVID-19 declaration under Section 564(b)(1) of the Act, 21 U.S.C. section 360bbb-3(b)(1), unless the authorization is terminated or revoked sooner.  Performed at [facility], [address], [place], [place] [address]     [place]  WO CONTRAST  Result Date: 01/02/2020 CLINICAL DATA:  Low back pain.  Recent falling. EXAM: MRI LUMBAR SPINE WITHOUT CONTRAST TECHNIQUE: Multiplanar, multisequence MR imaging of the lumbar spine was performed. No intravenous contrast was administered. COMPARISON:  None. FINDINGS: Segmentation:  5 lumbar type vertebral bodies. Alignment: 3 mm degenerative anterolisthesis L4-5. 11 mm anterolisthesis L5-S1 due to chronic facet arthropathy and possibly a left-sided pars defect. Vertebrae: Old compression deformity at L2. Recent sacral fracture, described completely by the pelvic MRI. Conus medullaris and cauda equina: Conus extends to the L1-2 level. Conus and cauda equina appear normal. Paraspinal and other soft tissues: Negative Disc levels: L1-2: Shallow disc protrusion indents the thecal sac slightly but does not cause compressive stenosis. L2-3: Normal L3-4: Minimal noncompressive disc bulge.  Mild facet osteoarthritis. L4-5: Chronic facet osteoarthritis with 3 mm of anterolisthesis. Bulging of the disc. Stenosis of the lateral recesses and neural foramina. Potential for neural compression in the intervertebral foramen on the right. L5-S1: 11 mm anterolisthesis. Chronic disc degeneration with loss of disc height. There appears to be chronic facet arthropathy on both sides. Question pars defect on the left. No compressive central canal stenosis. Bilateral foraminal narrowing could affect the exiting L5 nerves. IMPRESSION: Sacral fracture. See results of pelvic MRI. No acute  lumbar fracture. Old healed fracture deformity at L2. L4-5: Chronic facet arthropathy with 3 mm of anterolisthesis. Bulging of the disc. Stenosis of the lateral recesses and foramina right more than left. In particular, the right L4 nerve could be compressed in the foramen. L5-S1: Chronic facet arthropathy and possibly pars defect on the left. Anterolisthesis of 11 mm. Chronic disc degeneration. Bilateral foraminal stenosis could affect the exiting L5 nerves. Electronically Signed   By: Nelson Chimes M.D.   On: 01/02/2020 15:32   MR PELVIS WO CONTRAST  Result Date: 01/02/2020 CLINICAL DATA:  Status post fall x2 over the past 2-3 days. Low back pain. Initial encounter. EXAM: MRI PELVIS WITHOUT CONTRAST TECHNIQUE: Multiplanar, multisequence MR imaging of the pelvis was performed. No intravenous contrast was administered. COMPARISON:  None. FINDINGS: Bones/Joint/Cartilage Marrow edema in the sacrum due to bilateral acute or early subacute sacral fractures is identified. Edema is slightly worse in the right sacrum. No other fracture is seen. The patient has bone-on-bone right hip joint space narrowing with osteophytosis about the right femoral head and subchondral edema in the superior aspect of the femoral head. Lower lumbar degenerative change noted. No joint effusion. Ligaments Intact. Muscles and Tendons No strain or tear is identified. Atrophy of gluteal musculature noted. Small volume of fluid in the left trochanteric bursa is seen. Soft tissues No acute or focal abnormality is seen within the pelvis. IMPRESSION: Findings consistent with acute or subacute bilateral sacral fractures which appear nondisplaced. Advanced right hip osteoarthritis. Small volume of fluid in the left trochanteric bursa compatible with bursitis. Electronically Signed   By: Inge Rise M.D.   On: 01/02/2020 15:41    Review of Systems Blood pressure (!) 112/58, pulse 88, temperature 97.9 F (36.6 C), temperature source Oral,  resp. rate 18, height 5\' 3"  (1.6 m), weight 73 kg, SpO2 93 %. Physical Exam Tender to palpation at the sacrum.  Neurovascular intact Assessment/Plan: Sacral insufficiency fractures bilateral Plans for sacral plasty tomorrow  Hessie Knows 01/03/2020, 1:07 PM

## 2020-01-03 NOTE — Progress Notes (Signed)
Shift Summary:  Patient had no acute events overnight. Patient on room air, A/O x 3-4 with some periods of intermittent confusion.  Purewick in place, urinary output adequate. Patient medicated per MAR.  Will continue top monitor patient.

## 2020-01-04 ENCOUNTER — Inpatient Hospital Stay: Payer: Medicare Other | Admitting: Certified Registered Nurse Anesthetist

## 2020-01-04 ENCOUNTER — Inpatient Hospital Stay: Payer: Medicare Other

## 2020-01-04 ENCOUNTER — Encounter
Admission: EM | Disposition: A | Discharge status: Skilled Nursing Facility | Payer: Self-pay | Source: Home / Self Care | Attending: Internal Medicine

## 2020-01-04 ENCOUNTER — Encounter: Payer: Self-pay | Admitting: Internal Medicine

## 2020-01-04 HISTORY — PX: SACROPLASTY: SHX6797

## 2020-01-04 LAB — CBC
HCT: 36.8 % (ref 36.0–46.0)
Hemoglobin: 12.2 g/dL (ref 12.0–15.0)
MCH: 31.1 pg (ref 26.0–34.0)
MCHC: 33.2 g/dL (ref 30.0–36.0)
MCV: 93.9 fL (ref 80.0–100.0)
Platelets: 228 10*3/uL (ref 150–400)
RBC: 3.92 MIL/uL (ref 3.87–5.11)
RDW: 13.2 % (ref 11.5–15.5)
WBC: 7.6 10*3/uL (ref 4.0–10.5)
nRBC: 0 % (ref 0.0–0.2)

## 2020-01-04 LAB — COMPREHENSIVE METABOLIC PANEL
ALT: 20 U/L (ref 0–44)
AST: 25 U/L (ref 15–41)
Albumin: 2.9 g/dL — ABNORMAL LOW (ref 3.5–5.0)
Alkaline Phosphatase: 69 U/L (ref 38–126)
Anion gap: 10 (ref 5–15)
BUN: 18 mg/dL (ref 8–23)
CO2: 25 mmol/L (ref 22–32)
Calcium: 8.6 mg/dL — ABNORMAL LOW (ref 8.9–10.3)
Chloride: 104 mmol/L (ref 98–111)
Creatinine, Ser: 1.03 mg/dL — ABNORMAL HIGH (ref 0.44–1.00)
GFR calc Af Amer: 56 mL/min — ABNORMAL LOW (ref >60)
GFR calc non Af Amer: 48 mL/min — ABNORMAL LOW (ref >60)
Glucose, Bld: 179 mg/dL — ABNORMAL HIGH (ref 70–99)
Potassium: 3.4 mmol/L — ABNORMAL LOW (ref 3.5–5.1)
Sodium: 139 mmol/L (ref 135–145)
Total Bilirubin: 0.5 mg/dL (ref 0.3–1.2)
Total Protein: 6.3 g/dL — ABNORMAL LOW (ref 6.5–8.1)

## 2020-01-04 LAB — GLUCOSE, CAPILLARY: Glucose-Capillary: 113 mg/dL — ABNORMAL HIGH (ref 70–99)

## 2020-01-04 LAB — SURGICAL PCR SCREEN
MRSA, PCR: NEGATIVE
Staphylococcus aureus: NEGATIVE

## 2020-01-04 LAB — MAGNESIUM: Magnesium: 2 mg/dL (ref 1.7–2.4)

## 2020-01-04 LAB — TROPONIN I (HIGH SENSITIVITY)
Troponin I (High Sensitivity): 18 ng/L — ABNORMAL HIGH (ref <18)
Troponin I (High Sensitivity): 21 ng/L — ABNORMAL HIGH (ref <18)

## 2020-01-04 LAB — BRAIN NATRIURETIC PEPTIDE: B Natriuretic Peptide: 300.3 pg/mL — ABNORMAL HIGH (ref 0.0–100.0)

## 2020-01-04 SURGERY — SACROPLASTY
Anesthesia: General | Site: Back

## 2020-01-04 MED ORDER — CHLORHEXIDINE GLUCONATE CLOTH 2 % EX PADS
6.0000 | MEDICATED_PAD | Freq: Once | CUTANEOUS | Status: AC
  Administered 2020-01-04: 6 via TOPICAL

## 2020-01-04 MED ORDER — PROPOFOL 500 MG/50ML IV EMUL
INTRAVENOUS | Status: DC | PRN
  Administered 2020-01-04: 30 ug/kg/min via INTRAVENOUS

## 2020-01-04 MED ORDER — PNEUMOCOCCAL VAC POLYVALENT 25 MCG/0.5ML IJ INJ: 0.5000 mL | INJECTION | INTRAMUSCULAR | Status: DC

## 2020-01-04 MED ORDER — BUPIVACAINE-EPINEPHRINE (PF) 0.5% -1:200000 IJ SOLN
INTRAMUSCULAR | Status: DC | PRN
  Administered 2020-01-04: 20 mL via PERINEURAL

## 2020-01-04 MED ORDER — FENTANYL CITRATE (PF) 100 MCG/2ML IJ SOLN: 25.0000 ug | INTRAMUSCULAR | Status: DC | PRN

## 2020-01-04 MED ORDER — ENOXAPARIN SODIUM 40 MG/0.4ML ~~LOC~~ SOLN
40.0000 mg | SUBCUTANEOUS | Status: DC
  Administered 2020-01-05: 40 mg via SUBCUTANEOUS
  Filled 2020-01-04: qty 0.4

## 2020-01-04 MED ORDER — LIDOCAINE HCL (PF) 1 % IJ SOLN
INTRAMUSCULAR | Status: DC | PRN
  Administered 2020-01-04: 10 mL
  Administered 2020-01-04: 20 mL

## 2020-01-04 MED ORDER — IOHEXOL 350 MG/ML SOLN
75.0000 mL | Freq: Once | INTRAVENOUS | Status: AC | PRN
  Administered 2020-01-04: 75 mL via INTRAVENOUS

## 2020-01-04 MED ORDER — FUROSEMIDE 10 MG/ML IJ SOLN
40.0000 mg | Freq: Once | INTRAMUSCULAR | Status: DC
  Filled 2020-01-04: qty 4

## 2020-01-04 MED ORDER — PROPOFOL 10 MG/ML IV BOLUS
INTRAVENOUS | Status: AC
  Filled 2020-01-04: qty 20

## 2020-01-04 MED ORDER — PROPOFOL 10 MG/ML IV BOLUS
INTRAVENOUS | Status: DC | PRN
  Administered 2020-01-04 (×2): 20 mg via INTRAVENOUS

## 2020-01-04 MED ORDER — LACTATED RINGERS IV SOLN: INTRAVENOUS | Status: DC | PRN

## 2020-01-04 MED ORDER — CEFAZOLIN SODIUM-DEXTROSE 1-4 GM/50ML-% IV SOLN
INTRAVENOUS | Status: AC
  Filled 2020-01-04: qty 50

## 2020-01-04 MED ORDER — KETAMINE HCL 50 MG/ML IJ SOLN
INTRAMUSCULAR | Status: DC | PRN
  Administered 2020-01-04: 15 mg via INTRAVENOUS
  Administered 2020-01-04: 10 mg via INTRAVENOUS

## 2020-01-04 MED ORDER — ONDANSETRON HCL 4 MG/2ML IJ SOLN: 4.0000 mg | Freq: Once | INTRAMUSCULAR | Status: DC | PRN

## 2020-01-04 SURGICAL SUPPLY — 22 items
BIT DRILL KYPHON EXPRESS (MISCELLANEOUS) ×1 IMPLANT
BNDG COHESIVE 3X5 WHT NS (GAUZE/BANDAGES/DRESSINGS) ×2 IMPLANT
CEMENT KYPHON CX01A KIT/MIXER (Cement) ×2 IMPLANT
COVER WAND RF STERILE (DRAPES) ×2 IMPLANT
DERMABOND ADVANCED (GAUZE/BANDAGES/DRESSINGS) ×1
DERMABOND ADVANCED .7 DNX12 (GAUZE/BANDAGES/DRESSINGS) ×1 IMPLANT
DRAPE C-ARM XRAY 36X54 (DRAPES) ×2 IMPLANT
DRILL KYPHON EXPRESS (MISCELLANEOUS) ×2
DURAPREP 26ML APPLICATOR (WOUND CARE) ×2 IMPLANT
GLOVE SURG SYN 9.0  PF PI (GLOVE) ×1
GLOVE SURG SYN 9.0 PF PI (GLOVE) ×1 IMPLANT
GOWN SRG 2XL LVL 4 RGLN SLV (GOWNS) ×1 IMPLANT
GOWN STRL NON-REIN 2XL LVL4 (GOWNS) ×1
GOWN STRL REUS W/ TWL LRG LVL3 (GOWN DISPOSABLE) ×1 IMPLANT
GOWN STRL REUS W/TWL LRG LVL3 (GOWN DISPOSABLE) ×1
KIT OSTEOCOOL BONE ACCESS 10G (MISCELLANEOUS) ×4 IMPLANT
KIT TURNOVER KIT A (KITS) ×2 IMPLANT
PACK KYPHOPLASTY (MISCELLANEOUS) ×2 IMPLANT
SWABSTK COMLB BENZOIN TINCTURE (MISCELLANEOUS) ×2 IMPLANT
SYS CARTRIDGE BONE CEMENT 8ML (SYSTAGENIX WOUND MANAGEMENT) ×2
SYSTEM CARTRIDG BONE CEMNT 8ML (SYSTAGENIX WOUND MANAGEMENT) ×1 IMPLANT
SYSTEM GUN BONE FILLER SZ2 (MISCELLANEOUS) ×2 IMPLANT

## 2020-01-04 NOTE — Anesthesia Preprocedure Evaluation (Signed)
Anesthesia Evaluation  Patient identified by MRN, date of birth, ID band Patient awake    Reviewed: Allergy & Precautions, H&P , NPO status , Patient's Chart, lab work & pertinent test results, reviewed documented beta blocker date and time   Airway Mallampati: II  TM Distance: >3 FB Neck ROM: full    Dental no notable dental hx.    Pulmonary neg pulmonary ROS,    Pulmonary exam normal breath sounds clear to auscultation       Cardiovascular Exercise Tolerance: Good hypertension, On Medications  Rhythm:regular Rate:Normal     Neuro/Psych negative neurological ROS  negative psych ROS   GI/Hepatic negative GI ROS, Neg liver ROS,   Endo/Other  negative endocrine ROS  Renal/GU negative Renal ROS  negative genitourinary   Musculoskeletal   Abdominal   Peds negative pediatric ROS (+)  Hematology negative hematology ROS (+)   Anesthesia Other Findings Past Medical History: 2017: Breast cancer ([place])     Comment:  DCIS of right breast 2003: Colon cancer ([place])     Comment:  adenocarcinoma of the ascending colon, 2007 No date: Environmental allergies No date: High cholesterol No date: Hypertension 2007: Large cell lymphoma ([place])     Comment:  Diffuse large cell lymphoma, completed CHOP and Rituxan               in March 2004 2003: Non Hodgkin's lymphoma [facility]) 2003: Personal history of chemotherapy     Comment:  Non Hodgkins No date: Skin cancer  Reproductive/Obstetrics negative OB ROS                             Anesthesia Physical  Anesthesia Plan  ASA: II  Anesthesia Plan: MAC   Post-op Pain Management:    Induction: Intravenous  PONV Risk Score and Plan: Propofol infusion  Airway Management Planned:   Additional Equipment:   Intra-op Plan:   Post-operative Plan:   Informed Consent: I have reviewed the patients History and Physical, chart, labs and discussed the  procedure including the risks, benefits and alternatives for the proposed anesthesia with the patient or authorized representative who has indicated his/her understanding and acceptance.       Plan Discussed with: CRNA  Anesthesia Plan Comments:         Anesthesia Quick Evaluation

## 2020-01-04 NOTE — Transfer of Care (Signed)
Immediate Anesthesia Transfer of Care Note  Patient: Janice [name]  Procedure(s) Performed: SACROPLASTY (N/A Back)  Patient Location: PACU  Anesthesia Type:general  Level of Consciousness: awake and alert   Airway & Oxygen Therapy: Patient Spontanous Breathing and Patient connected to nasal cannula oxygen  Post-op Assessment: Report given to RN and Post -op Vital signs reviewed and stable  Post vital signs: Reviewed  Last Vitals:  Vitals Value Taken Time  BP    Temp    Pulse 98 01/04/20 1541  Resp 18 01/04/20 1541  SpO2 93 % 01/04/20 1541  Vitals shown include unvalidated device data.  Last Pain:  Vitals:   01/04/20 1408  TempSrc: Temporal  PainSc: 0-No pain         Complications: No complications documented.

## 2020-01-04 NOTE — Progress Notes (Signed)
PT Cancellation Note  Patient Details Name: Janice [name] MRN: [id number] DOB: September 09, 1928   Cancelled Treatment:    Reason Eval/Treat Not Completed: Other (comment) Pt to have sacroplasty this afternoon, will hold PT services this date and will attempt to eval tomorrow as appropriate post procedure.  Kreg Shropshire, DPT 01/04/2020, 11:45 AM

## 2020-01-04 NOTE — Op Note (Signed)
01/04/2020  3:40 PM  PATIENT:  Janice [name]  86 y.o. female  PRE-OPERATIVE DIAGNOSIS: Sacral insufficiency fracture   POST-OPERATIVE DIAGNOSIS: Sacral insufficiency fracture  PROCEDURE:  Procedure(s): SACROPLASTY (N/A)  SURGEON: Laurene Footman, MD  ASSISTANTS: None  ANESTHESIA:   local and MAC  EBL:  Total I/O In: 150 [I.V.:100; IV Piggyback:50] Out: -   BLOOD ADMINISTERED:none  DRAINS: none   LOCAL MEDICATIONS USED:  MARCAINE    and XYLOCAINE   SPECIMEN:  No Specimen  DISPOSITION OF SPECIMEN:  N/A  COUNTS:  YES  TOURNIQUET:  * No tourniquets in log *  IMPLANTS: Bone cement  DICTATION: .Dragon Dictation patient was brought to the operating room and after adequate sedation was given the patient was placed prone.  C arm was brought in and AP and lateral imaging with good visualization of the sacrum and SI joints.  After appropriate patient identification and timeout procedures were completed local anesthetic was infiltrated in the area of the planned incision after prepping the skin with chlorhexidine.  The back was then prepped and draped in the usual sterile fashion and repeat timeout procedure carried out.  A spinal needle was used to get a 10cc Marcaine half percent Marcaine with 10 cc 1% Xylocaine infiltrated down to the sacrum.  There is done the area of the planned approach.  Next after allowing this to set a small incision was made first on the left and then the right with trocar advanced between the foramina and SI joint with rotated view showing the SI joint well.  This was done first again on the left on the right and after trochars were placed cement was mixed and was the appropriate consistency on the right approximately 3 cc of bone cement was injected without any extravasation in about 4 cc on the left.  There was good fill in S1 and S2.  When the cemented set the trochars were removed and the wounds closed with Dermabond followed by Band-Aids.  PLAN OF CARE:  Continue as inpatient  PATIENT DISPOSITION:  PACU - hemodynamically stable.

## 2020-01-04 NOTE — Progress Notes (Signed)
Pt BP elevated and Pt SpO2 in 80's on RA. RN put 2 L [name] on pt and SpO2 in 90's now. RN made [name] MD aware of BP and SpO2. Attending ordered PRN labetalol. RN to continue to monitor pt.

## 2020-01-04 NOTE — Progress Notes (Signed)
Cross Cover Brief Note Nurse page reports patient s/p sacroplasty developed hypoxia  Around 5pm on room air and alleviated with application of 3L oxygen via [place]. She also noted irregular pulse. 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  PAC's were new.  Patient was suppose to see cardiologist today for possible atrial fib noted at PCP.   Patient had told RN that she felt "weird". Vital signs stable but increased work of breathing possible per Therapist, sports.  Review of labs shows stable CBC, BMP 6/15 without abnormalities, no recent mag level. Patient also noted to have received dilaudid recently but was after hypoxia had been noted Review of PACU vitals, patient had required 3L [place] and aslo noted on tele to have BBB, PACs, and Unifocal PVC's  Bedside son and brother in room.  They reported at PCP office patient was trembling so bad they could not get accurate EKG but irregular pulse and rhythm and had appointment today with cardiologist at [facility] which was cancelled to allow for her procedure today. Patient confused to year but knows where she is. She denies ever experiencing palpitations, paroxysmal nocturnal dyspnea.  She does endorse not being able to lay flat but not due to breathing, due to back pain.  She does endorse occasional dizziness at home and occurs probably twice a week.  She is not able to identify patten, accompanying, exacerbating or relieving factors to her dizziness.  Duration of dizziness is stated to be short. Denies leg pains.  Admits to leg/ankle swelling that resolves after sleep and this has been going on for approx 6 months.  Patient admits to decreased energy and being not very active. Son reports this has been since start of pandemic 3.2020 and she sleeps most of her days.Prior to this she was more active, was driving and went out to stores.  Exam -  SR with PVC's .  Noted to have productive cough.  Bibasilar rales appreciated. No  leg calf pain with palpation or dosiflexion. Peripheral pulses strong. No lower extremit edema   Chest xray - no frank edema identified, chronic bronchial thickening, no history of acute bronchitis Trop - 18, repeat in 2 hours 21 BNP - 300 -lasix after CT echo, cardiology consult BMP - low K replacement ordered MAG - normal CBC - normal, HGB slightly lower by 1 g/dl compared to 6/15 [place]" Score for PE = 7 high risk - CTA ordered - negative PE but positive for fluid.  Was treated with lasix and IV fluids discontinued.

## 2020-01-04 NOTE — Progress Notes (Signed)
PROGRESS NOTE    Janice [name]  CHE:[id number] DOB: 1929-07-08 DOA: 01/02/2020 PCP: Ezequiel Kayser, MD   Brief Narrative:  Janice [name] is a 86 y.o. female with medical history significant of hypertension, non-Hodgkin lymphoma, skin cancer, adenocarcinoma of colon and DCIS of right breast was brought to ED from orthopedic office for lumbar spinal and pelvic MRI and pain control.  Patient was seen at orthopedic clinic today with complaint of multiple falls over the past few days.  Worsening back pain.  Family is unable to take care of her at home due to limited mobility secondary to pain. MRI pelvis with bilateral sacral fractures.  Going for sacral plasty tomorrow with orthopedic.  Subjective: Patient continued to experience pain with mobility.  She was waiting for her sacral plasty later today.  Son was in the room.  Assessment & Plan:   Active Problems:   Closed sacral fracture (HCC)  Bilateral sacral fractures with fall.  Patient is being admitted for pain control.  Pain is well controlled under current regimen -Continue Norco every 4 hourly. -Dilaudid 0.5 mg every 6 hourly as needed for breakthrough pain. -PT/OT evaluation-recommending SNF placement.  Family will decide after the surgery.. -Orthopedic was consulted by ED-patient is going for sacral plasty today.  Hypertension.  Pressure within goal. -Continue home dose of amlodipine and lisinopril.  History of DCIS of breast. -Continue home dose of tamoxifen.  Dyslipidemia. -Continue home dose of Lipitor. -Hold aspirin for sacral plasty by orthopedic.  We will restart after the procedure.  Objective: Vitals:   01/03/20 1605 01/04/20 0012 01/04/20 0727 01/04/20 1408  BP: 135/64 134/67 (!) 165/75 (!) 153/58  Pulse: 84 84 85 94  Resp: 18 20 17 18   Temp: 98.4 F (36.9 C) 98.4 F (36.9 C) 98.1 F (36.7 C) (!) 97.1 F (36.2 C)  TempSrc: Oral Oral Oral Temporal  SpO2: 90% 97% 98% 92%  Weight:      Height:         Intake/Output Summary (Last 24 hours) at 01/04/2020 1515 Last data filed at 01/04/2020 1500 Gross per 24 hour  Intake 50 ml  Output --  Net 50 ml   Filed Weights   01/02/20 1202  Weight: 73 kg    Examination:  General exam: Appears calm and comfortable  Respiratory system: Clear to auscultation. Respiratory effort normal. Cardiovascular system: S1 & S2 heard, RRR. No JVD, +ve murmurs. Gastrointestinal system: Soft, nontender, nondistended, bowel sounds positive. Central nervous system: Alert and oriented. No focal neurological deficits. Extremities: No edema, no cyanosis, pulses intact and symmetrical. Psychiatry: Judgement and insight appear normal. Mood & affect appropriate.    DVT prophylaxis: Lovenox Code Status: Full Family Communication: Son was updated at bedside Disposition Plan:  Status is: Inpatient  Remains inpatient appropriate because:Inpatient level of care appropriate due to severity of illness   Dispo: The patient is from: Home              Anticipated d/c is to: To be determined              Anticipated d/c date is: 2 days              Patient currently is not medically stable to d/c.  Ongoing pain management.  Going for sacral plasty today.  PT is recommending SNF placement.  Family will decide after the surgery.  Consultants:   Orthopedic  Procedures:  Antimicrobials:   Data Reviewed: I have personally reviewed following labs and imaging  studies  CBC: Recent Labs  Lab 01/02/20 1357  WBC 6.4  NEUTROABS 4.7  HGB 13.1  HCT 39.4  MCV 93.4  PLT 678   Basic Metabolic Panel: Recent Labs  Lab 01/02/20 1357  NA 137  K 4.0  CL 99  CO2 24  GLUCOSE 98  BUN 18  CREATININE 0.93  CALCIUM 9.2   GFR: Estimated Creatinine Clearance: 39.2 mL/min (by C-G formula based on SCr of 0.93 mg/dL). Liver Function Tests: No results for input(s): AST, ALT, ALKPHOS, BILITOT, PROT, ALBUMIN in the last 168 hours. No results for input(s): LIPASE, AMYLASE  in the last 168 hours. No results for input(s): AMMONIA in the last 168 hours. Coagulation Profile: No results for input(s): INR, PROTIME in the last 168 hours. Cardiac Enzymes: No results for input(s): CKTOTAL, CKMB, CKMBINDEX, TROPONINI in the last 168 hours. BNP (last 3 results) No results for input(s): PROBNP in the last 8760 hours. HbA1C: No results for input(s): HGBA1C in the last 72 hours. CBG: No results for input(s): GLUCAP in the last 168 hours. Lipid Profile: No results for input(s): CHOL, HDL, LDLCALC, TRIG, CHOLHDL, LDLDIRECT in the last 72 hours. Thyroid Function Tests: No results for input(s): TSH, T4TOTAL, FREET4, T3FREE, THYROIDAB in the last 72 hours. Anemia Panel: No results for input(s): VITAMINB12, FOLATE, FERRITIN, TIBC, IRON, RETICCTPCT in the last 72 hours. Sepsis Labs: No results for input(s): PROCALCITON, LATICACIDVEN in the last 168 hours.  Recent Results (from the past 240 hour(s))  SARS Coronavirus 2 by RT PCR (hospital order, performed in [facility] hospital lab) Nasopharyngeal Nasopharyngeal Swab     Status: None   Collection Time: 01/02/20  1:57 PM   Specimen: Nasopharyngeal Swab  Result Value Ref Range Status   SARS Coronavirus 2 NEGATIVE NEGATIVE Final    Comment: (NOTE) SARS-CoV-2 target nucleic acids are NOT DETECTED.  The SARS-CoV-2 RNA is generally detectable in upper and lower respiratory specimens during the acute phase of infection. The lowest concentration of SARS-CoV-2 viral copies this assay can detect is 250 copies / mL. A negative result does not preclude SARS-CoV-2 infection and should not be used as the sole basis for treatment or other patient management decisions.  A negative result may occur with improper specimen collection / handling, submission of specimen other than nasopharyngeal swab, presence of viral mutation(s) within the areas targeted by this assay, and inadequate number of viral copies (<250 copies / mL). A  negative result must be combined with clinical observations, patient history, and epidemiological information.  Fact Sheet for Patients:   StrictlyIdeas.no  Fact Sheet for Healthcare Providers: BankingDealers.co.za  This test is not yet approved or  cleared by the [place] FDA and has been authorized for detection and/or diagnosis of SARS-CoV-2 by FDA under an Emergency Use Authorization (EUA).  This EUA will remain in effect (meaning this test can be used) for the duration of the COVID-19 declaration under Section 564(b)(1) of the Act, 21 U.S.C. section 360bbb-3(b)(1), unless the authorization is terminated or revoked sooner.  Performed at [facility], [address] [place], [place] [address]   Surgical PCR screen     Status: None   Collection Time: 01/04/20  2:21 AM   Specimen: Nasal Swab  Result Value Ref Range Status   MRSA, PCR NEGATIVE NEGATIVE Final   Staphylococcus aureus NEGATIVE NEGATIVE Final    Comment: (NOTE) The Xpert SA Assay (FDA approved for NASAL specimens in patients 27 years of age and older), is one component  of a comprehensive surveillance program. It is not intended to diagnose infection nor to guide or monitor treatment. Performed at [facility], [address], [place], [place] [address]      Radiology Studies: MR LUMBAR SPINE WO CONTRAST  Result Date: 01/02/2020 CLINICAL DATA:  Low back pain.  Recent falling. EXAM: MRI LUMBAR SPINE WITHOUT CONTRAST TECHNIQUE: Multiplanar, multisequence MR imaging of the lumbar spine was performed. No intravenous contrast was administered. COMPARISON:  None. FINDINGS: Segmentation:  5 lumbar type vertebral bodies. Alignment: 3 mm degenerative anterolisthesis L4-5. 11 mm anterolisthesis L5-S1 due to chronic facet arthropathy and possibly a left-sided pars defect. Vertebrae: Old compression deformity at L2. Recent sacral fracture, described  completely by the pelvic MRI. Conus medullaris and cauda equina: Conus extends to the L1-2 level. Conus and cauda equina appear normal. Paraspinal and other soft tissues: Negative Disc levels: L1-2: Shallow disc protrusion indents the thecal sac slightly but does not cause compressive stenosis. L2-3: Normal L3-4: Minimal noncompressive disc bulge.  Mild facet osteoarthritis. L4-5: Chronic facet osteoarthritis with 3 mm of anterolisthesis. Bulging of the disc. Stenosis of the lateral recesses and neural foramina. Potential for neural compression in the intervertebral foramen on the right. L5-S1: 11 mm anterolisthesis. Chronic disc degeneration with loss of disc height. There appears to be chronic facet arthropathy on both sides. Question pars defect on the left. No compressive central canal stenosis. Bilateral foraminal narrowing could affect the exiting L5 nerves. IMPRESSION: Sacral fracture. See results of pelvic MRI. No acute lumbar fracture. Old healed fracture deformity at L2. L4-5: Chronic facet arthropathy with 3 mm of anterolisthesis. Bulging of the disc. Stenosis of the lateral recesses and foramina right more than left. In particular, the right L4 nerve could be compressed in the foramen. L5-S1: Chronic facet arthropathy and possibly pars defect on the left. Anterolisthesis of 11 mm. Chronic disc degeneration. Bilateral foraminal stenosis could affect the exiting L5 nerves. Electronically Signed   By: Nelson Chimes M.D.   On: 01/02/2020 15:32   MR PELVIS WO CONTRAST  Result Date: 01/02/2020 CLINICAL DATA:  Status post fall x2 over the past 2-3 days. Low back pain. Initial encounter. EXAM: MRI PELVIS WITHOUT CONTRAST TECHNIQUE: Multiplanar, multisequence MR imaging of the pelvis was performed. No intravenous contrast was administered. COMPARISON:  None. FINDINGS: Bones/Joint/Cartilage Marrow edema in the sacrum due to bilateral acute or early subacute sacral fractures is identified. Edema is slightly  worse in the right sacrum. No other fracture is seen. The patient has bone-on-bone right hip joint space narrowing with osteophytosis about the right femoral head and subchondral edema in the superior aspect of the femoral head. Lower lumbar degenerative change noted. No joint effusion. Ligaments Intact. Muscles and Tendons No strain or tear is identified. Atrophy of gluteal musculature noted. Small volume of fluid in the left trochanteric bursa is seen. Soft tissues No acute or focal abnormality is seen within the pelvis. IMPRESSION: Findings consistent with acute or subacute bilateral sacral fractures which appear nondisplaced. Advanced right hip osteoarthritis. Small volume of fluid in the left trochanteric bursa compatible with bursitis. Electronically Signed   By: Inge Rise M.D.   On: 01/02/2020 15:41    Scheduled Meds:  [MAR Hold] amLODipine  10 mg Oral Daily   [MAR Hold] atorvastatin  10 mg Oral Daily   [MAR Hold] HYDROcodone-acetaminophen  1-2 tablet Oral Q4H   [MAR Hold] lisinopril  20 mg Oral Daily   [MAR Hold] senna  1 tablet Oral BID   [  MAR Hold] tamoxifen  20 mg Oral Daily   Continuous Infusions:  [MAR Hold]  ceFAZolin (ANCEF) IV       LOS: 2 days   Time spent: 30 minutes.  [name], MD [facility]  If 7PM-7AM, please contact night-coverage [email or web address]  01/04/2020, 3:15 PM   This record has been created using Systems analyst. Errors have been sought and corrected,but may not always be located. Such creation errors do not reflect on the standard of care.

## 2020-01-04 NOTE — Progress Notes (Addendum)
Pt c/o overall weird feeling, Difficulty breathing, Pt on 3 L [place] and SpO2 95%. Pt on SpO2. Pt having irregular HR. Pt's family says PCP saw Afib on EKG, Pt had a cardiologist appointment that was cancelled today. RN notified [name] MD, provider ordered EKG. RN placed on Telemetry.

## 2020-01-04 NOTE — Progress Notes (Signed)
Pt EKG done, RN called night covering MD about results and pt concerns regarding VS. MD to come see pt and assess.

## 2020-01-04 NOTE — Progress Notes (Signed)
Per [name] MD, give pt morning meds with a sip of water.

## 2020-01-04 NOTE — Plan of Care (Signed)
  Problem: Education: Goal: Knowledge of General Education information will improve Description: Including pain rating scale, medication(s)/side effects and non-pharmacologic comfort measures 01/04/2020 0140 by Guinevere Ferrari, RN Outcome: Progressing 01/04/2020 0140 by Guinevere Ferrari, RN Outcome: Progressing   Problem: Health Behavior/Discharge Planning: Goal: Ability to manage health-related needs will improve 01/04/2020 0140 by Guinevere Ferrari, RN Outcome: Progressing 01/04/2020 0140 by Guinevere Ferrari, RN Outcome: Progressing   Problem: Clinical Measurements: Goal: Ability to maintain clinical measurements within normal limits will improve 01/04/2020 0140 by Guinevere Ferrari, RN Outcome: Progressing 01/04/2020 0140 by Guinevere Ferrari, RN Outcome: Progressing Goal: Will remain free from infection 01/04/2020 0140 by Guinevere Ferrari, RN Outcome: Progressing 01/04/2020 0140 by Guinevere Ferrari, RN Outcome: Progressing Goal: Diagnostic test results will improve 01/04/2020 0140 by Guinevere Ferrari, RN Outcome: Progressing 01/04/2020 0140 by Guinevere Ferrari, RN Outcome: Progressing Goal: Respiratory complications will improve 01/04/2020 0140 by Guinevere Ferrari, RN Outcome: Progressing 01/04/2020 0140 by Guinevere Ferrari, RN Outcome: Progressing Goal: Cardiovascular complication will be avoided 01/04/2020 0140 by Guinevere Ferrari, RN Outcome: Progressing 01/04/2020 0140 by Guinevere Ferrari, RN Outcome: Progressing   Problem: Activity: Goal: Risk for activity intolerance will decrease 01/04/2020 0140 by Guinevere Ferrari, RN Outcome: Progressing 01/04/2020 0140 by Guinevere Ferrari, RN Outcome: Progressing   Problem: Nutrition: Goal: Adequate nutrition will be maintained 01/04/2020 0140 by Guinevere Ferrari, RN Outcome: Progressing 01/04/2020 0140 by Guinevere Ferrari, RN Outcome: Progressing   Problem: Coping: Goal: Level of anxiety will decrease 01/04/2020 0140 by Guinevere Ferrari, RN Outcome:  Progressing 01/04/2020 0140 by Guinevere Ferrari, RN Outcome: Progressing   Problem: Elimination: Goal: Will not experience complications related to bowel motility 01/04/2020 0140 by Guinevere Ferrari, RN Outcome: Progressing 01/04/2020 0140 by Guinevere Ferrari, RN Outcome: Progressing Goal: Will not experience complications related to urinary retention 01/04/2020 0140 by Guinevere Ferrari, RN Outcome: Progressing 01/04/2020 0140 by Guinevere Ferrari, RN Outcome: Progressing   Problem: Pain Managment: Goal: General experience of comfort will improve 01/04/2020 0140 by Guinevere Ferrari, RN Outcome: Progressing 01/04/2020 0140 by Guinevere Ferrari, RN Outcome: Progressing   Problem: Safety: Goal: Ability to remain free from injury will improve 01/04/2020 0140 by Guinevere Ferrari, RN Outcome: Progressing 01/04/2020 0140 by Guinevere Ferrari, RN Outcome: Progressing   Problem: Skin Integrity: Goal: Risk for impaired skin integrity will decrease 01/04/2020 0140 by Guinevere Ferrari, RN Outcome: Progressing 01/04/2020 0140 by Guinevere Ferrari, RN Outcome: Progressing   Problem: Education: Goal: Ability to state activities that reduce stress will improve 01/04/2020 0140 by Guinevere Ferrari, RN Outcome: Progressing 01/04/2020 0140 by Guinevere Ferrari, RN Outcome: Progressing   Problem: Coping: Goal: Ability to identify and develop effective coping behavior will improve 01/04/2020 0140 by Guinevere Ferrari, RN Outcome: Progressing 01/04/2020 0140 by Guinevere Ferrari, RN Outcome: Progressing   Problem: Self-Concept: Goal: Ability to identify factors that promote anxiety will improve 01/04/2020 0140 by Guinevere Ferrari, RN Outcome: Progressing 01/04/2020 0140 by Guinevere Ferrari, RN Outcome: Progressing Goal: Level of anxiety will decrease 01/04/2020 0140 by Guinevere Ferrari, RN Outcome: Progressing 01/04/2020 0140 by Guinevere Ferrari, RN Outcome: Progressing Goal: Ability to modify response to factors that  promote anxiety will improve 01/04/2020 0140 by Guinevere Ferrari, RN Outcome: Progressing 01/04/2020 0140 by Guinevere Ferrari, RN Outcome: Progressing

## 2020-01-05 ENCOUNTER — Inpatient Hospital Stay
Admit: 2020-01-05 | Discharge: 2020-01-05 | Disposition: A | Discharge status: Home / Self Care | Payer: Medicare Other | Attending: Acute Care | Admitting: Acute Care

## 2020-01-05 ENCOUNTER — Encounter: Payer: Self-pay | Admitting: Orthopedic Surgery

## 2020-01-05 DIAGNOSIS — R0902 Hypoxemia: Secondary | ICD-10-CM

## 2020-01-05 LAB — BASIC METABOLIC PANEL
Anion gap: 8 (ref 5–15)
BUN: 21 mg/dL (ref 8–23)
CO2: 27 mmol/L (ref 22–32)
Calcium: 8.6 mg/dL — ABNORMAL LOW (ref 8.9–10.3)
Chloride: 106 mmol/L (ref 98–111)
Creatinine, Ser: 0.98 mg/dL (ref 0.44–1.00)
GFR calc Af Amer: 59 mL/min — ABNORMAL LOW (ref >60)
GFR calc non Af Amer: 51 mL/min — ABNORMAL LOW (ref >60)
Glucose, Bld: 96 mg/dL (ref 70–99)
Potassium: 3.7 mmol/L (ref 3.5–5.1)
Sodium: 141 mmol/L (ref 135–145)

## 2020-01-05 LAB — CBC
HCT: 35.8 % — ABNORMAL LOW (ref 36.0–46.0)
Hemoglobin: 11.8 g/dL — ABNORMAL LOW (ref 12.0–15.0)
MCH: 31.5 pg (ref 26.0–34.0)
MCHC: 33 g/dL (ref 30.0–36.0)
MCV: 95.5 fL (ref 80.0–100.0)
Platelets: 214 10*3/uL (ref 150–400)
RBC: 3.75 MIL/uL — ABNORMAL LOW (ref 3.87–5.11)
RDW: 13.2 % (ref 11.5–15.5)
WBC: 5.5 10*3/uL (ref 4.0–10.5)
nRBC: 0 % (ref 0.0–0.2)

## 2020-01-05 LAB — ECHOCARDIOGRAM COMPLETE
Height: 63 in
Weight: 2574.97 oz

## 2020-01-05 MED ORDER — HYDROCODONE-ACETAMINOPHEN 5-325 MG PO TABS: 1.0000 | ORAL_TABLET | ORAL | 0 refills | Status: DC | PRN

## 2020-01-05 MED ORDER — SENNA 8.6 MG PO TABS: 1.0000 | ORAL_TABLET | Freq: Two times a day (BID) | ORAL | 0 refills | Status: DC

## 2020-01-05 MED ORDER — HYDROCODONE-ACETAMINOPHEN 5-325 MG PO TABS: 1.0000 | ORAL_TABLET | ORAL | Status: DC | PRN

## 2020-01-05 MED ORDER — POLYETHYLENE GLYCOL 3350 17 G PO PACK: 17.0000 g | PACK | Freq: Every day | ORAL | 0 refills | Status: DC | PRN

## 2020-01-05 MED ORDER — ASPIRIN EC 81 MG PO TBEC
81.0000 mg | DELAYED_RELEASE_TABLET | Freq: Every day | ORAL | Status: DC
  Administered 2020-01-05: 81 mg via ORAL
  Filled 2020-01-05: qty 1

## 2020-01-05 NOTE — Progress Notes (Signed)
Patient is more alert this morning knows where she is and why.  She does feel like she is better able to sit up more in bed and turn in bed with much less pain.  Mobilize today and see if she can go home.

## 2020-01-05 NOTE — Plan of Care (Signed)

## 2020-01-05 NOTE — NC FL2 (Signed)
[place] LEVEL OF CARE SCREENING TOOL     IDENTIFICATION  Patient Name: Janice [name] Birthdate: July 18, 1929 Sex: female Admission Date (Current Location): 01/02/2020  [place] and [place] Number:  Engineering geologist and Address:  [facility], [address] [place], [place] [address]      Provider Number: [id number]  Attending Physician Name and Address:  [name], MD  Relative Name and Phone Number:  Ivar Bury [phone]    Current Level of Care: Hospital Recommended Level of Care: [place] Prior Approval Number:    Date Approved/Denied:   PASRR Number: [id number]  Discharge Plan: SNF    Current Diagnoses: Patient Active Problem List   Diagnosis Date Noted  . Closed sacral fracture ([place]) 01/02/2020  . Inadequate pain control   . Breast mass, right   . Ductal carcinoma in situ (DCIS) of right breast 03/26/2016  . History of non-Hodgkin's lymphoma 07/19/2015  . Bronchitis 11/23/2013  . History of colon cancer 11/23/2013  . HTN (hypertension) 11/23/2013  . Hypercholesterolemia 11/23/2013    Orientation RESPIRATION BLADDER Height & Weight     Self, Time, Situation, Place  Normal Continent Weight: 73 kg Height:  5\' 3"  (160 cm)  BEHAVIORAL SYMPTOMS/MOOD NEUROLOGICAL BOWEL NUTRITION STATUS      Continent Diet (regular)  AMBULATORY STATUS COMMUNICATION OF NEEDS Skin   Extensive Assist Verbally Surgical wounds                       Personal Care Assistance Level of Assistance  Bathing, Dressing Bathing Assistance: Limited assistance   Dressing Assistance: Limited assistance     Functional Limitations Info             SPECIAL CARE FACTORS FREQUENCY  PT (By licensed PT)     PT Frequency: 5 times per week              Contractures Contractures Info: Not present    Additional Factors Info  Code Status Code Status Info: full code             Current Medications  (01/05/2020):  This is the current hospital active medication list Current Facility-Administered Medications  Medication Dose Route Frequency Provider Last Rate Last Admin  . acetaminophen (TYLENOL) tablet 650 mg  650 mg Oral Q6H PRN Hessie Knows, MD   650 mg at 01/03/20 2230   Or  . acetaminophen (TYLENOL) suppository 650 mg  650 mg Rectal Q6H PRN Hessie Knows, MD      . amLODipine (NORVASC) tablet 10 mg  10 mg Oral Daily Hessie Knows, MD   10 mg at 01/05/20 0946  . atorvastatin (LIPITOR) tablet 10 mg  10 mg Oral Daily Hessie Knows, MD   10 mg at 01/05/20 0947  . enoxaparin (LOVENOX) injection 40 mg  40 mg Subcutaneous Q24H Hessie Knows, MD   40 mg at 01/05/20 0947  . furosemide (LASIX) injection 40 mg  40 mg Intravenous Once Sharion Settler, NP      . HYDROcodone-acetaminophen (NORCO/VICODIN) 5-325 MG per tablet 1-2 tablet  1-2 tablet Oral Q4H Hessie Knows, MD   2 tablet at 01/04/20 2233  . HYDROmorphone (DILAUDID) injection 0.5 mg  0.5 mg Intravenous Q4H PRN Hessie Knows, MD   0.5 mg at 01/04/20 2043  . lisinopril (ZESTRIL) tablet 20 mg  20 mg Oral Daily Hessie Knows, MD   20 mg at 01/05/20 0946  . ondansetron (ZOFRAN) tablet 4 mg  4 mg Oral Q6H PRN Hessie Knows, MD       Or  . ondansetron [facility]) injection 4 mg  4 mg Intravenous Q6H PRN Hessie Knows, MD      . pneumococcal 23 valent vaccine (PNEUMOVAX-23) injection 0.5 mL  0.5 mL Intramuscular Tomorrow-1000 Amin, Soundra Pilon, MD      . polyethylene glycol (MIRALAX / GLYCOLAX) packet 17 g  17 g Oral Daily PRN Hessie Knows, MD   17 g at 01/03/20 0925  . senna (SENOKOT) tablet 8.6 mg  1 tablet Oral BID Hessie Knows, MD   8.6 mg at 01/05/20 0946  . tamoxifen (NOLVADEX) tablet 20 mg  20 mg Oral Daily Hessie Knows, MD   20 mg at 01/05/20 4158     Discharge Medications: Please see discharge summary for a list of discharge medications.  Relevant Imaging Results:  Relevant Lab Results:   Additional Information SS#  [id number]  Su Hilt, RN

## 2020-01-05 NOTE — Evaluation (Signed)
Physical Therapy Evaluation Patient Details Name: Janice [name] MRN: [id number] DOB: 1929/05/26 Today's Date: 01/05/2020   History of Present Illness  86 year old female with medical history significant of hypertension, non-Hodgkin lymphoma, skin cancer, adenocarcinoma of colon and DCIS of right breast was brought to ED from orthopedic office for lumbar spinal and pelvic MRI and pain control.  Patient was seen at orthopedic clinic today with complaint of multiple falls over the past few days.  Worsening back pain.  Family is unable to take care of her at home due to limited mobility secondary to pain. MRI pelvis with bilateral sacral fractures. Now s/p sacral plasty 6/17.  Clinical Impression  Pt with guarded pain t/o the session, reports sacral pain is similar to presurgery levels, though she was able to do some bed mobility, transfers and ~10 ft of ambulation with heavy walker reliance.  Pt very fatigued with the modest effort and HR actually increased to ~150 bpm.  Pt was hoping to be able to go home but agrees that STR is the only safe option at this time.     Follow Up Recommendations SNF    Equipment Recommendations  Rolling walker with 5" wheels    Recommendations for Other Services       Precautions / Restrictions Precautions Precautions: Fall Restrictions Weight Bearing Restrictions: No Other Position/Activity Restrictions: sacroplasty       Mobility  Bed Mobility Overal bed mobility: Needs Assistance Bed Mobility: Rolling;Sidelying to Sit;Sit to Sidelying Rolling: Min guard Sidelying to sit: Min assist       General bed mobility comments: Pt showed good effort, cues for neutral spine, able to use rails to assist self to EOB  Transfers Overall transfer level: Needs assistance Equipment used: Rolling walker (2 wheeled) Transfers: Sit to/from Stand Sit to Stand: Min assist         General transfer comment: Slightly elevated bed, heavy UE use and  cuing  Ambulation/Gait Ambulation/Gait assistance: Min assist Gait Distance (Feet): 9 Feet Assistive device: Rolling walker (2 wheeled)       General Gait Details: Pt with very slow, labored ambulation, but with walker did not have any overt LOBs or safety issues.  HR to ~150 with the effort, further activity deferred  Stairs            Wheelchair Mobility    Modified Rankin (Stroke Patients Only)       Balance Overall balance assessment: Needs assistance   Sitting balance-Leahy Scale: Fair Sitting balance - Comments: c/o pain, needing to urinate and general anxiety in sitting   Standing balance support: Bilateral upper extremity supported Standing balance-Leahy Scale: Fair Standing balance comment: reliant on walker, very guarded but no LOBs                             Pertinent Vitals/Pain Pain Assessment: Faces Faces Pain Scale: Hurts even more Pain Location: does endorse low back/sacral pain - especially in sitting EOB    Home Living Family/patient expects to be discharged to:: Skilled nursing facility Living Arrangements: Children Available Help at Discharge: Available PRN/intermittently Type of Home: House         Home Equipment: Bedside commode;Walker - 2 wheels;Cane - single point      Prior Function Level of Independence: Needs assistance   Gait / Transfers Assistance Needed: Pt able to ambulate in house with RW or cane, has not been walking outside 2/2 fear of uneven  terrain  ADL's / Homemaking Assistance Needed: Pt generally mod I in ADLs/household IADLs including bathing, dressing, simple meal prep, and medication management  Comments: Pt has been much more limited the last few weeks     Hand Dominance        Extremity/Trunk Assessment   Upper Extremity Assessment Upper Extremity Assessment: Generalized weakness    Lower Extremity Assessment Lower Extremity Assessment: Generalized weakness       Communication    Communication: No difficulties  Cognition Arousal/Alertness: Awake/alert Behavior During Therapy: Restless Overall Cognitive Status: Within Functional Limits for tasks assessed                                        General Comments      Exercises     Assessment/Plan    PT Assessment Patient needs continued PT services  PT Problem List Decreased strength;Decreased range of motion;Decreased activity tolerance;Decreased balance;Decreased mobility;Decreased coordination;Decreased cognition;Decreased knowledge of use of DME;Decreased safety awareness;Decreased knowledge of precautions;Cardiopulmonary status limiting activity;Pain       PT Treatment Interventions DME instruction;Gait training;Functional mobility training;Therapeutic activities;Therapeutic exercise;Balance training;Neuromuscular re-education;Cognitive remediation;Patient/family education    PT Goals (Current goals can be found in the Care Plan section)  Acute Rehab PT Goals Patient Stated Goal: to get back to home PT Goal Formulation: With patient Time For Goal Achievement: 01/19/20 Potential to Achieve Goals: Fair    Frequency 7X/week   Barriers to discharge        Co-evaluation               AM-PAC PT "6 Clicks" Mobility  Outcome Measure Help needed turning from your back to your side while in a flat bed without using bedrails?: A Little Help needed moving from lying on your back to sitting on the side of a flat bed without using bedrails?: A Little Help needed moving to and from a bed to a chair (including a wheelchair)?: A Little Help needed standing up from a chair using your arms (e.g., wheelchair or bedside chair)?: A Little Help needed to walk in hospital room?: A Lot Help needed climbing 3-5 steps with a railing? : Total 6 Click Score: 15    End of Session Equipment Utilized During Treatment: Gait belt Activity Tolerance: Patient limited by pain Patient left: with chair alarm  set;with call bell/phone within reach;with family/visitor present Nurse Communication: Mobility status PT Visit Diagnosis: Muscle weakness (generalized) (M62.81);Difficulty in walking, not elsewhere classified (R26.2);Unsteadiness on feet (R26.81);Pain Pain - part of body:  (lumbago)    Time: 4696-2952 PT Time Calculation (min) (ACUTE ONLY): 42 min   Charges:     PT Treatments $Gait Training: 8-22 mins        Kreg Shropshire, DPT 01/05/2020, 1:52 PM

## 2020-01-05 NOTE — Discharge Instructions (Signed)
Acute Pain, Adult Acute pain is a type of sudden pain that may last for just a few days or for as long as six months. It is often related to an illness, injury, or medical procedure. Acute pain may be mild, moderate, or severe. Pain can make it hard for you to do your normal, daily activities. It can cause anxiety and lead to other problems if it is left untreated. Treatment depends on the cause and severity of your pain. Acute pain usually goes away once your injury has healed or you are no longer ill. Follow these instructions at home: Medicines   Take over-the-counter and prescription medicines only as told by your health care provider.  Take the lowest dose of medicine for the shortest amount of time needed to relieve the pain.  If you are taking prescription pain medicine: ? Do not stop taking the medicine suddenly. Talk to your health care provider about how and when to discontinue prescription medicine. ? Do not take more pills than told by your health care provider even if your pain is severe. ? Do not take other over-the-counter pain medicines in addition to prescription pain medicine unless told by your health care provider. ? Ask your health care provider if the medicine requires you to avoid driving or using heavy machinery. ? Ask your health care provider if the medicine can cause constipation. You may need to take these actions to prevent or treat constipation:  Drink enough fluid to keep your urine pale yellow.  Eat foods that are high in fiber, such as beans, whole grains, and fresh fruits and vegetables.  Take over-the-counter or prescription medicines.  Limit foods that are high in fat and processed sugars, such as fried or sweet foods. Managing pain, stiffness, and swelling     If directed, put ice on the affected area. To do this:  Put ice in a plastic bag.  Place a towel between your skin and the bag.  Leave the ice on for 20 minutes, 2-3 times a day. If  directed, apply heat to the affected area as often as told by your health care provider. Use the heat source that your health care provider recommends, such as a moist heat pack or a heating pad.  Place a towel between your skin and the heat source.  Leave the heat on for 20-30 minutes.  Remove the heat if your skin turns bright red. This is especially important if you are unable to feel pain, heat, or cold. You may have a greater risk of getting burned.  Activity  Rest as told by your health care provider.  Return to your normal activities as told by your health care provider. Ask your health care provider what activities are safe for you. General instructions  Check your pain level as told by your health care provider.  Ask your health care provider if other strategies such as distraction, relaxation, or physical therapies can help your pain.  Keep all follow-up visits as told by your health care provider. This is important. Contact a health care provider if:  Your pain is not controlled by medicine.  Your pain does not improve or gets worse.  You have side effects from pain medicines, such as vomiting or confusion. Get help right away if you:  Have severe pain.  Have trouble breathing.  Lose consciousness.  Have chest pain or pressure that lasts for more than a few minutes, or if you have other symptoms along with chest pain,   including if you: ? Have pain or discomfort in one or both arms, your back, neck, jaw, or stomach. ? Have shortness of breath. ? Break out in a cold sweat. ? Feel nauseous. ? Become light-headed. These symptoms may represent a serious problem that is an emergency. Do not wait to see if the symptoms will go away. Get medical help right away. Call your local emergency services (911 in the [place] Do not drive yourself to the hospital. Summary  Acute pain may be mild, moderate, or severe. It usually goes away once your injury has healed or you are no  longer ill.  Take over-the-counter and prescription medicines only as told by your health care provider.  Ask your health care provider if the medicine prescribed to you can cause constipation.  Contact a health care provider if your pain is not controlled by medicine. This information is not intended to replace advice given to you by your health care provider. Make sure you discuss any questions you have with your health care provider. Document Revised: 11/21/2018 Document Reviewed: 11/21/2018 Elsevier Patient Education  2020 [employer]  

## 2020-01-05 NOTE — TOC Initial Note (Signed)
Transition of Care [facility]) - Initial/Assessment Note    Patient Details  Name: Janice [name] MRN: [id number] Date of Birth: 10-06-1928  [facility]) CM/SW Contact:    Su Hilt, RN Phone Number: 01/05/2020, 10:49 AM  Clinical Narrative:                  Met with the patient and her family in the room, she understands that she may need to go to SNF for a short time, She is agreeable, they asked me to add [facility] in [place] to my check, FL2, PASSR and bedsearch completed, once get bed offers will review Expected Discharge Plan: Skilled Nursing Facility Barriers to Discharge: Continued Medical Work up   Patient Goals and CMS Choice Patient states their goals for this hospitalization and ongoing recovery are:: get well emough to go home      Expected Discharge Plan and Services Expected Discharge Plan: [place]   Discharge Planning Services: CM Consult   Living arrangements for the past 2 months: Single Family Home                 DME Arranged: Walker rolling DME Agency: [employer]                  Prior Living Arrangements/Services Living arrangements for the past 2 months: Single Family Home Lives with:: Spouse, Relatives, Adult Children Patient language and need for interpreter reviewed:: Yes Do you feel safe going back to the place where you live?: Yes      Need for Family Participation in Patient Care: No (Comment) Care giver support system in place?: Yes (comment) Current home services: DME ([facility] Walker, [employer], [facility]) Criminal Activity/Legal Involvement Pertinent to Current Situation/Hospitalization: No - Comment as needed  Activities of Daily Living Home Assistive Devices/Equipment: Cane (specify quad or straight), Walker (specify type) ADL Screening (condition at time of admission) Patient's cognitive ability adequate to safely complete daily activities?: Yes Is the patient deaf or have difficulty hearing?:  No Does the patient have difficulty seeing, even when wearing glasses/contacts?: Yes Does the patient have difficulty concentrating, remembering, or making decisions?: Yes Patient able to express need for assistance with ADLs?: Yes Does the patient have difficulty dressing or bathing?: Yes Independently performs ADLs?: No Communication: Independent Dressing (OT): Needs assistance Is this a change from baseline?: Pre-admission baseline Grooming: Needs assistance Is this a change from baseline?: Pre-admission baseline Feeding: Independent Bathing: Needs assistance Is this a change from baseline?: Pre-admission baseline Toileting: Needs assistance Is this a change from baseline?: Pre-admission baseline In/Out Bed: Needs assistance Is this a change from baseline?: Pre-admission baseline Walks in Home: Needs assistance Is this a change from baseline?: Pre-admission baseline Does the patient have difficulty walking or climbing stairs?: Yes Weakness of Legs: Both Weakness of Arms/Hands: Both  Permission Sought/Granted   Permission granted to share information with : Yes, Verbal Permission Granted              Emotional Assessment Appearance:: Appears stated age Attitude/Demeanor/Rapport: Engaged Affect (typically observed): Appropriate Orientation: : Oriented to Self, Oriented to Place, Oriented to  Time, Oriented to Situation Alcohol / Substance Use: Not Applicable Psych Involvement: No (comment)  Admission diagnosis:  Closed sacral fracture (HCC) [S32.10XA] Inadequate pain control [R52] Closed fracture of sacrum, unspecified fracture morphology, initial encounter ([place]) [S32.10XA] Patient Active Problem List   Diagnosis Date Noted  . Closed sacral fracture ([place]) 01/02/2020  . Inadequate pain control   . Breast  mass, right   . Ductal carcinoma in situ (DCIS) of right breast 03/26/2016  . History of non-Hodgkin's lymphoma 07/19/2015  . Bronchitis 11/23/2013  . History of  colon cancer 11/23/2013  . HTN (hypertension) 11/23/2013  . Hypercholesterolemia 11/23/2013   PCP:  Ezequiel Kayser, MD Pharmacy:   [facility] [place], [place] [address] [place] [address] Phone: [phone] Fax: [phone]  [facility] [place], [place] [address] [place] [address] Phone: [phone] Fax: [phone]  [facility] [place] [place] [facility] [address] [employer] [address] [place] [address] Phone: [phone] Fax: [phone]     Social Determinants of Health (SDOH) Interventions    Readmission Risk Interventions No flowsheet data found.

## 2020-01-05 NOTE — Discharge Summary (Signed)
Physician Discharge Summary  Janice [name] HYW:[id number] DOB: [birth year] DOA: 01/02/2020  PCP: Ezequiel Kayser, MD  Admit date: 01/02/2020 Discharge date: 01/05/2020  Admitted From: Home Disposition: SNF  Recommendations for Outpatient Follow-up:  1. Follow up with PCP in 1-2 weeks 2. Follow-up with orthopedic 3. Follow-up with cardiology 4. Please obtain BMP/CBC in one week 5. Please follow up on the following pending results: None  Home Health: No Equipment/Devices: Rolling walker Discharge Condition: Stable CODE STATUS: Full Diet recommendation: Heart Healthy   Brief/Interim Summary: Janice [name] a 86 y.o.femalewith medical history significant ofhypertension, non-Hodgkin lymphoma, skin cancer, adenocarcinoma of colon and DCIS of right breast was brought to ED from orthopedic office for lumbar spinal and pelvic MRI and pain control. Patient was seen at orthopedic clinic today with complaint of multiple falls over the past few days. Worsening back pain. Family is unable to take care of her at home due to limited mobility secondary to pain. MRI pelvis with bilateral sacral fractures.  She underwent sacral plasty with orthopedic surgery yesterday on 01/04/2020.  She did develop mild hypotension, hypoxia and tachycardia postoperatively.  EKG shows normal sinus rhythm with chronic first-degree heart block.  Troponin remain negative.  CTA was also done which was negative for PE.  Cardiology was consulted and they do not think anything needs to be done at this point.  She can follow-up with them as an outpatient and continue her home meds.  Patient was feeling much better next morning.  She was sitting in chair when seen today.  Son was present in the room.  Pain has been improved significantly.  She was ready to go to a rehab facility for rehab before returning home. Patient was given Norco for severe pain as needed.  Please try her tramadol first and if that does not control her pain then she  can take Norco.  She will continue rest of her home meds.  Discharge Diagnoses:  Active Problems:   Closed sacral fracture ([place])   Hypoxia  Discharge Instructions  Discharge Instructions    Diet - low sodium heart healthy   Complete by: As directed    Increase activity slowly   Complete by: As directed    Leave dressing on - Keep it clean, dry, and intact until clinic visit   Complete by: As directed      Allergies as of 01/05/2020   No Known Allergies     Medication List    TAKE these medications   acetaminophen 500 MG tablet Commonly known as: TYLENOL Take 500 mg by mouth every 6 (six) hours as needed.   alendronate 70 MG tablet Commonly known as: FOSAMAX Take 70 mg by mouth once a week.   amLODipine 10 MG tablet Commonly known as: NORVASC Take 10 mg by mouth daily.   aspirin 81 MG tablet Take 81 mg by mouth daily.   atorvastatin 10 MG tablet Commonly known as: LIPITOR Take 10 mg by mouth daily.   HYDROcodone-acetaminophen 5-325 MG tablet Commonly known as: NORCO/VICODIN Take 1-2 tablets by mouth every 4 (four) hours as needed for moderate pain. What changed:   how much to take  when to take this  reasons to take this   lisinopril 20 MG tablet Commonly known as: ZESTRIL Take 20 mg by mouth daily.   polyethylene glycol 17 g packet Commonly known as: MIRALAX / GLYCOLAX Take 17 g by mouth daily as needed for mild constipation.   senna 8.6 MG Tabs tablet Commonly  known as: SENOKOT Take 1 tablet (8.6 mg total) by mouth 2 (two) times daily.   tamoxifen 20 MG tablet Commonly known as: NOLVADEX TAKE 1 TABLET DAILY   traMADol 50 MG tablet Commonly known as: ULTRAM Take 50 mg by mouth every 6 (six) hours as needed.   Vitamin D (Ergocalciferol) 1.25 MG (50000 UNIT) Caps capsule Commonly known as: DRISDOL Take 50,000 Units by mouth every 7 (seven) days.            Discharge Care Instructions  (From admission, onward)         Start      Ordered   01/05/20 0000  Leave dressing on - Keep it clean, dry, and intact until clinic visit        01/05/20 1441          Contact information for follow-up providers    Ezequiel Kayser, MD. Schedule an appointment as soon as possible for a visit.   Specialty: Internal Medicine Contact information: [place] [address] [phone]        Hessie Knows, MD. Schedule an appointment as soon as possible for a visit.   Specialty: Orthopedic Surgery Contact information: [address] [place] [address] [phone]        Isaias Cowman, MD. Schedule an appointment as soon as possible for a visit.   Specialty: Cardiology Contact information: [facility] [place] [address] [phone]            Contact information for after-discharge care    Destination    [facility] Preferred SNF .   Service: Skilled Nursing Contact information: [address] [place] [phone]                 No Known Allergies  Consultations:  Orthopedic  Cardiology  Procedures/Studies: DG Chest 1 View  Result Date: 01/04/2020 CLINICAL DATA:  Difficulty breathing. Atrial fibrillation. History of lymphoma EXAM: CHEST  1 VIEW COMPARISON:  PET-CT July 23, 2010 FINDINGS: There is diffuse interstitial thickening. There is no frank edema or airspace opacity. Heart size and pulmonary vascularity are normal. There is aortic atherosclerosis. No adenopathy. No bone lesions. IMPRESSION: Diffuse interstitial thickening. Suspect a degree of chronic bronchitis. No edema or airspace opacity. Heart size normal. Aortic Atherosclerosis (ICD10-I70.0). Electronically Signed   By: Lowella Grip III M.D.   On: 01/04/2020 20:20   DG Sacrum/Coccyx  Result Date: 01/04/2020 CLINICAL DATA:  Fracture, sacral augmentation EXAM:  SACRUM AND COCCYX - 2+ VIEW; DG C-ARM 1-60 MIN COMPARISON:  01/02/2020 FINDINGS: Eleven fluoroscopic images are obtained during the performance of the procedure and are submitted for interpretation only. Images demonstrate cement augmentation of bilateral sacral ale are fractures. Alignment appears anatomic. FLUOROSCOPY TIME:  33.6 seconds IMPRESSION: 1. Intraoperative exam as above. Electronically Signed   By: Randa Ngo M.D.   On: 01/04/2020 16:46   CT ANGIO CHEST PE W OR WO CONTRAST  Result Date: 01/04/2020 CLINICAL DATA:  Shortness of breath and hypoxia. EXAM: CT ANGIOGRAPHY CHEST WITH CONTRAST TECHNIQUE: Multidetector CT imaging of the chest was performed using the standard protocol during bolus administration of intravenous contrast. Multiplanar CT image reconstructions and MIPs were obtained to evaluate the vascular anatomy. CONTRAST:  35mL OMNIPAQUE IOHEXOL 350 MG/ML SOLN COMPARISON:  Chest radiography same day FINDINGS: Cardiovascular: Cardiomegaly. Coronary artery calcification. Aortic atherosclerosis. Pulmonary arterial opacification is good. No pulmonary emboli.  Mediastinum/Nodes: No mediastinal or hilar mass or lymphadenopathy. Lungs/Pleura: Small amount of dependent pleural fluid. Mild dependent pulmonary atelectasis. Suspicion interstitial pulmonary edema. Upper Abdomen: Negative Musculoskeletal: Ordinary degenerative changes of the thoracic spine. Review of the MIP images confirms the above findings. IMPRESSION: No pulmonary emboli. Cardiomegaly, aortic atherosclerosis and coronary artery calcification. Probable interstitial pulmonary edema. Small effusions with mild dependent pulmonary atelectasis. Electronically Signed   By: Nelson Chimes M.D.   On: 01/04/2020 21:29   MR LUMBAR SPINE WO CONTRAST  Result Date: 01/02/2020 CLINICAL DATA:  Low back pain.  Recent falling. EXAM: MRI LUMBAR SPINE WITHOUT CONTRAST TECHNIQUE: Multiplanar, multisequence MR imaging of the lumbar spine was  performed. No intravenous contrast was administered. COMPARISON:  None. FINDINGS: Segmentation:  5 lumbar type vertebral bodies. Alignment: 3 mm degenerative anterolisthesis L4-5. 11 mm anterolisthesis L5-S1 due to chronic facet arthropathy and possibly a left-sided pars defect. Vertebrae: Old compression deformity at L2. Recent sacral fracture, described completely by the pelvic MRI. Conus medullaris and cauda equina: Conus extends to the L1-2 level. Conus and cauda equina appear normal. Paraspinal and other soft tissues: Negative Disc levels: L1-2: Shallow disc protrusion indents the thecal sac slightly but does not cause compressive stenosis. L2-3: Normal L3-4: Minimal noncompressive disc bulge.  Mild facet osteoarthritis. L4-5: Chronic facet osteoarthritis with 3 mm of anterolisthesis. Bulging of the disc. Stenosis of the lateral recesses and neural foramina. Potential for neural compression in the intervertebral foramen on the right. L5-S1: 11 mm anterolisthesis. Chronic disc degeneration with loss of disc height. There appears to be chronic facet arthropathy on both sides. Question pars defect on the left. No compressive central canal stenosis. Bilateral foraminal narrowing could affect the exiting L5 nerves. IMPRESSION: Sacral fracture. See results of pelvic MRI. No acute lumbar fracture. Old healed fracture deformity at L2. L4-5: Chronic facet arthropathy with 3 mm of anterolisthesis. Bulging of the disc. Stenosis of the lateral recesses and foramina right more than left. In particular, the right L4 nerve could be compressed in the foramen. L5-S1: Chronic facet arthropathy and possibly pars defect on the left. Anterolisthesis of 11 mm. Chronic disc degeneration. Bilateral foraminal stenosis could affect the exiting L5 nerves. Electronically Signed   By: Nelson Chimes M.D.   On: 01/02/2020 15:32   MR PELVIS WO CONTRAST  Result Date: 01/02/2020 CLINICAL DATA:  Status post fall x2 over the past 2-3 days. Low  back pain. Initial encounter. EXAM: MRI PELVIS WITHOUT CONTRAST TECHNIQUE: Multiplanar, multisequence MR imaging of the pelvis was performed. No intravenous contrast was administered. COMPARISON:  None. FINDINGS: Bones/Joint/Cartilage Marrow edema in the sacrum due to bilateral acute or early subacute sacral fractures is identified. Edema is slightly worse in the right sacrum. No other fracture is seen. The patient has bone-on-bone right hip joint space narrowing with osteophytosis about the right femoral head and subchondral edema in the superior aspect of the femoral head. Lower lumbar degenerative change noted. No joint effusion. Ligaments Intact. Muscles and Tendons No strain or tear is identified. Atrophy of gluteal musculature noted. Small volume of fluid in the left trochanteric bursa is seen. Soft tissues No acute or focal abnormality is seen within the pelvis. IMPRESSION: Findings consistent with acute or subacute bilateral sacral fractures which appear nondisplaced. Advanced right hip osteoarthritis. Small volume of fluid in the left trochanteric bursa compatible with bursitis. Electronically Signed   By: Inge Rise M.D.   On: 01/02/2020 15:41   DG C-Arm 1-60 Min  Result Date: 01/04/2020 CLINICAL DATA:  Fracture, sacral augmentation EXAM: SACRUM AND COCCYX - 2+ VIEW; DG C-ARM 1-60 MIN COMPARISON:  01/02/2020 FINDINGS: Eleven fluoroscopic images are obtained during the performance of the procedure and are submitted for interpretation only. Images demonstrate cement augmentation of bilateral sacral ale are fractures. Alignment appears anatomic. FLUOROSCOPY TIME:  33.6 seconds IMPRESSION: 1. Intraoperative exam as above. Electronically Signed   By: Randa Ngo M.D.   On: 01/04/2020 16:46   ECHOCARDIOGRAM COMPLETE  Result Date: 01/05/2020    ECHOCARDIOGRAM REPORT   Patient Name:   GABRYEL TALAMO Begin Date of Exam: 01/05/2020 Medical Rec #:  [id number]   Height:       63.0 in Accession #:    [id number]   Weight:       160.9 lb Date of Birth:  27-May-1929   BSA:          1.763 m Patient Age:    22 years    BP:           129/64 mmHg Patient Gender: F           HR:           90 bpm. Exam Location:  [facility] Procedure: 2D Echo, Color Doppler and Cardiac Doppler Indications:     R94.31 Abnormal ECG; R06.00 Dyspnea  History:         Patient has no prior history of Echocardiogram examinations.                  Risk Factors:Hypertension and HCL.  Sonographer:     Charmayne Sheer RDCS (AE) Referring Phys:  [id number] BRENDA MORRISON Diagnosing Phys: Isaias Cowman MD  Sonographer Comments: Technically difficult study due to poor echo windows. Image acquisition challenging due to patient body habitus and Image acquisition challenging due to respiratory motion. IMPRESSIONS  1. Left ventricular ejection fraction, by estimation, is 60 to 65%. The left ventricle has normal function. The left ventricle has no regional wall motion abnormalities. Left ventricular diastolic parameters are indeterminate.  2. Right ventricular systolic function is normal. The right ventricular size is normal. There is normal pulmonary artery systolic pressure.  3. The mitral valve is normal in structure. Mild mitral valve regurgitation. No evidence of mitral stenosis.  4. The aortic valve is normal in structure. Aortic valve regurgitation is not visualized. No aortic stenosis is present.  5. The inferior vena cava is normal in size with greater than 50% respiratory variability, suggesting right atrial pressure of 3 mmHg. FINDINGS  Left Ventricle: Left ventricular ejection fraction, by estimation, is 60 to 65%. The left ventricle has normal function. The left ventricle has no regional wall motion abnormalities. The left ventricular internal cavity size was normal in size. There is  no left ventricular hypertrophy. Left ventricular diastolic parameters are indeterminate. Right Ventricle: The right ventricular size is normal. No increase in right ventricular wall  thickness. Right ventricular systolic function is normal. There is normal pulmonary artery systolic pressure. The tricuspid regurgitant velocity is 2.47 m/s, and  with an assumed right atrial pressure of 10 mmHg, the estimated right ventricular systolic pressure is 30.8 mmHg. Left Atrium: Left atrial size was normal in size. Right Atrium: Right atrial size was normal in size. Pericardium: There is no evidence of pericardial effusion. Mitral Valve: The mitral valve is normal in structure. Normal mobility of the mitral valve leaflets. Mild mitral valve regurgitation. No evidence of mitral valve stenosis. MV peak gradient, 4.7 mmHg. The mean mitral valve gradient is 2.0 mmHg. Tricuspid Valve:  The tricuspid valve is normal in structure. Tricuspid valve regurgitation is mild . No evidence of tricuspid stenosis. Aortic Valve: The aortic valve is normal in structure. Aortic valve regurgitation is not visualized. No aortic stenosis is present. Aortic valve mean gradient measures 5.0 mmHg. Aortic valve peak gradient measures 8.4 mmHg. Aortic valve area, by VTI measures 2.22 cm. Pulmonic Valve: The pulmonic valve was normal in structure. Pulmonic valve regurgitation is not visualized. No evidence of pulmonic stenosis. Aorta: The aortic root is normal in size and structure. Venous: The inferior vena cava is normal in size with greater than 50% respiratory variability, suggesting right atrial pressure of 3 mmHg. IAS/Shunts: No atrial level shunt detected by color flow Doppler.  LEFT VENTRICLE PLAX 2D LVIDd:         2.45 cm  Diastology LVIDs:         1.62 cm  LV e' lateral:   8.70 cm/s LV PW:         0.98 cm  LV E/e' lateral: 11.4 LV IVS:        1.05 cm  LV e' medial:    7.72 cm/s LVOT diam:     2.10 cm  LV E/e' medial:  12.8 LV SV:         46 LV SV Index:   26 LVOT Area:     3.46 cm  LEFT ATRIUM             Index LA diam:        5.00 cm 2.84 cm/m LA Vol (A2C):   85.2 ml 48.33 ml/m LA Vol (A4C):   83.3 ml 47.25 ml/m LA  Biplane Vol: 90.1 ml 51.10 ml/m  AORTIC VALVE                   PULMONIC VALVE AV Area (Vmax):    2.17 cm    PV Vmax:       1.19 m/s AV Area (Vmean):   2.13 cm    PV Vmean:      80.000 cm/s AV Area (VTI):     2.22 cm    PV VTI:        0.198 m AV Vmax:           145.00 cm/s PV Peak grad:  5.7 mmHg AV Vmean:          99.000 cm/s PV Mean grad:  3.0 mmHg AV VTI:            0.206 m AV Peak Grad:      8.4 mmHg AV Mean Grad:      5.0 mmHg LVOT Vmax:         90.90 cm/s LVOT Vmean:        60.900 cm/s LVOT VTI:          0.132 m LVOT/AV VTI ratio: 0.64  AORTA Ao Root diam: 3.10 cm MITRAL VALVE               TRICUSPID VALVE MV Area (PHT): 4.49 cm    TR Peak grad:   24.4 mmHg MV Peak grad:  4.7 mmHg    TR Vmax:        247.00 cm/s MV Mean grad:  2.0 mmHg MV Vmax:       1.08 m/s    SHUNTS MV Vmean:      72.2 cm/s   Systemic VTI:  0.13 m MV Decel Time: 169 msec    Systemic Diam: 2.10 cm MV E  velocity: 98.90 cm/s Isaias Cowman MD Electronically signed by Isaias Cowman MD Signature Date/Time: 01/05/2020/1:14:14 PM    Final      Subjective: Patient is feeling better when seen today.  She was sitting in a chair.  Son was present in the room.  She continued to have some soreness around surgical area but stating that this is not the same pain he used to have before.  She was ready to go to a rehab facility for further rehab before returning home.  Discharge Exam: Vitals:   01/05/20 0803 01/05/20 1217  BP: 129/64 122/68  Pulse: 86 89  Resp: 17 16  Temp: 98.7 F (37.1 C) 98.2 F (36.8 C)  SpO2: 93% 95%   Vitals:   01/04/20 2317 01/05/20 0512 01/05/20 0803 01/05/20 1217  BP: (!) 103/57 (!) 112/50 129/64 122/68  Pulse: 76 63 86 89  Resp:  18 17 16   Temp: 98.6 F (37 C) 98.6 F (37 C) 98.7 F (37.1 C) 98.2 F (36.8 C)  TempSrc: Oral Oral Oral Oral  SpO2: 98% 100% 93% 95%  Weight:      Height:        General: Pt is alert, awake, not in acute distress Cardiovascular: RRR, S1/S2 +, no rubs, no  gallops Respiratory: CTA bilaterally, no wheezing, no rhonchi Abdominal: Soft, NT, ND, bowel sounds + Extremities: no edema, no cyanosis   The results of significant diagnostics from this hospitalization (including imaging, microbiology, ancillary and laboratory) are listed below for reference.    Microbiology: Recent Results (from the past 240 hour(s))  SARS Coronavirus 2 by RT PCR (hospital order, performed in [facility] hospital lab) Nasopharyngeal Nasopharyngeal Swab     Status: None   Collection Time: 01/02/20  1:57 PM   Specimen: Nasopharyngeal Swab  Result Value Ref Range Status   SARS Coronavirus 2 NEGATIVE NEGATIVE Final    Comment: (NOTE) SARS-CoV-2 target nucleic acids are NOT DETECTED.  The SARS-CoV-2 RNA is generally detectable in upper and lower respiratory specimens during the acute phase of infection. The lowest concentration of SARS-CoV-2 viral copies this assay can detect is 250 copies / mL. A negative result does not preclude SARS-CoV-2 infection and should not be used as the sole basis for treatment or other patient management decisions.  A negative result may occur with improper specimen collection / handling, submission of specimen other than nasopharyngeal swab, presence of viral mutation(s) within the areas targeted by this assay, and inadequate number of viral copies (<250 copies / mL). A negative result must be combined with clinical observations, patient history, and epidemiological information.  Fact Sheet for Patients:   StrictlyIdeas.no  Fact Sheet for Healthcare Providers: BankingDealers.co.za  This test is not yet approved or  cleared by the [place] FDA and has been authorized for detection and/or diagnosis of SARS-CoV-2 by FDA under an Emergency Use Authorization (EUA).  This EUA will remain in effect (meaning this test can be used) for the duration of the COVID-19 declaration under Section  564(b)(1) of the Act, 21 U.S.C. section 360bbb-3(b)(1), unless the authorization is terminated or revoked sooner.  Performed at [facility], [address] [place], [place] [address]   Surgical PCR screen     Status: None   Collection Time: 01/04/20  2:21 AM   Specimen: Nasal Swab  Result Value Ref Range Status   MRSA, PCR NEGATIVE NEGATIVE Final   Staphylococcus aureus NEGATIVE NEGATIVE Final    Comment: (NOTE) The Xpert SA Assay (FDA  approved for NASAL specimens in patients 46 years of age and older), is one component of a comprehensive surveillance program. It is not intended to diagnose infection nor to guide or monitor treatment. Performed at [facility], [address] [place], [place] [address]      Labs: BNP (last 3 results) Recent Labs    01/04/20 1936  BNP 379.0*   Basic Metabolic Panel: Recent Labs  Lab 01/02/20 1357 01/04/20 1936 01/05/20 0534  NA 137 139 141  K 4.0 3.4* 3.7  CL 99 104 106  CO2 24 25 27   GLUCOSE 98 179* 96  BUN 18 18 21   CREATININE 0.93 1.03* 0.98  CALCIUM 9.2 8.6* 8.6*  MG  --  2.0  --    Liver Function Tests: Recent Labs  Lab 01/04/20 1936  AST 25  ALT 20  ALKPHOS 69  BILITOT 0.5  PROT 6.3*  ALBUMIN 2.9*   No results for input(s): LIPASE, AMYLASE in the last 168 hours. No results for input(s): AMMONIA in the last 168 hours. CBC: Recent Labs  Lab 01/02/20 1357 01/04/20 1936 01/05/20 0534  WBC 6.4 7.6 5.5  NEUTROABS 4.7  --   --   HGB 13.1 12.2 11.8*  HCT 39.4 36.8 35.8*  MCV 93.4 93.9 95.5  PLT 217 228 214   Cardiac Enzymes: No results for input(s): CKTOTAL, CKMB, CKMBINDEX, TROPONINI in the last 168 hours. BNP: Invalid input(s): POCBNP CBG: Recent Labs  Lab 01/04/20 2320  GLUCAP 113*   D-Dimer No results for input(s): DDIMER in the last 72 hours. Hgb A1c No results for input(s): HGBA1C in the last 72 hours. Lipid Profile No results for input(s): CHOL, HDL, LDLCALC, TRIG,  CHOLHDL, LDLDIRECT in the last 72 hours. Thyroid function studies No results for input(s): TSH, T4TOTAL, T3FREE, THYROIDAB in the last 72 hours.  Invalid input(s): FREET3 Anemia work up No results for input(s): VITAMINB12, FOLATE, FERRITIN, TIBC, IRON, RETICCTPCT in the last 72 hours. Urinalysis    Component Value Date/Time   COLORURINE YELLOW 05/18/2014 1053   APPEARANCEUR CLOUDY 05/18/2014 1053   LABSPEC 1.020 05/18/2014 1053   PHURINE 5.5 05/18/2014 1053   GLUCOSEU NEGATIVE 05/18/2014 1053   HGBUR MODERATE 05/18/2014 1053   [place] 05/18/2014 1053   [place] 05/18/2014 1053   PROTEINUR 100 mg/dL 05/18/2014 1053   NITRITE NEGATIVE 05/18/2014 1053   LEUKOCYTESUR MODERATE 05/18/2014 1053   Sepsis Labs Invalid input(s): PROCALCITONIN,  WBC,  LACTICIDVEN Microbiology Recent Results (from the past 240 hour(s))  SARS Coronavirus 2 by RT PCR (hospital order, performed in [facility] hospital lab) Nasopharyngeal Nasopharyngeal Swab     Status: None   Collection Time: 01/02/20  1:57 PM   Specimen: Nasopharyngeal Swab  Result Value Ref Range Status   SARS Coronavirus 2 NEGATIVE NEGATIVE Final    Comment: (NOTE) SARS-CoV-2 target nucleic acids are NOT DETECTED.  The SARS-CoV-2 RNA is generally detectable in upper and lower respiratory specimens during the acute phase of infection. The lowest concentration of SARS-CoV-2 viral copies this assay can detect is 250 copies / mL. A negative result does not preclude SARS-CoV-2 infection and should not be used as the sole basis for treatment or other patient management decisions.  A negative result may occur with improper specimen collection / handling, submission of specimen other than nasopharyngeal swab, presence of viral mutation(s) within the areas targeted by this assay, and inadequate number of viral copies (<250 copies / mL). A negative result must be combined with  clinical observations, patient history, and  epidemiological information.  Fact Sheet for Patients:   StrictlyIdeas.no  Fact Sheet for Healthcare Providers: BankingDealers.co.za  This test is not yet approved or  cleared by the [place] FDA and has been authorized for detection and/or diagnosis of SARS-CoV-2 by FDA under an Emergency Use Authorization (EUA).  This EUA will remain in effect (meaning this test can be used) for the duration of the COVID-19 declaration under Section 564(b)(1) of the Act, 21 U.S.C. section 360bbb-3(b)(1), unless the authorization is terminated or revoked sooner.  Performed at [facility], [address] [place], [place] [address]   Surgical PCR screen     Status: None   Collection Time: 01/04/20  2:21 AM   Specimen: Nasal Swab  Result Value Ref Range Status   MRSA, PCR NEGATIVE NEGATIVE Final   Staphylococcus aureus NEGATIVE NEGATIVE Final    Comment: (NOTE) The Xpert SA Assay (FDA approved for NASAL specimens in patients 27 years of age and older), is one component of a comprehensive surveillance program. It is not intended to diagnose infection nor to guide or monitor treatment. Performed at [facility] [facility], [address] [place], [place] [address]     Time coordinating discharge: Over 30 minutes  SIGNED:  [name], MD  [facility] 01/05/2020, 2:46 PM  If 7PM-7AM, please contact night-coverage [email or web address]  This record has been created using Systems analyst. Errors have been sought and corrected,but may not always be located. Such creation errors do not reflect on the standard of care.

## 2020-01-05 NOTE — Consult Note (Signed)
[facility] Cardiology  CARDIOLOGY CONSULT NOTE  Patient ID: Janice [name] MRN: [id number] DOB/AGE: 84-Apr-1930 84 y.o.  Admit date: 01/02/2020 Referring Physician Randol Kern Primary Physician [facility] Primary Cardiologist  Reason for Consultation irregular heart rhythm  HPI: 86 year old female referred for evaluation of irregular heart rhythm.  Patient was scheduled to see [facility]iology on 01/04/2020 which was canceled due to procedure.  The patient has a history of multiple falls, evaluated by orthopedics with pelvic MRI revealing acute and subacute sacral fractures, and the patient underwent sacrolplasty yesterday.  ECG revealed sinus rhythm with right bundle branch block.  Telemetry has revealed sinus rhythm without evidence for atrial fibrillation.  Lab work revealed mildly elevated BNP at 300.3 and high-sensitivity opponent 18 and 21.  Chest x-ray did not reveal pulmonary edema.  Chest CT was negative for PE evidence of pulmonary edema.  Patient laying flat in bed, denies chest pain, shortness of breath, palpitations or heart racing.  Review of systems complete and found to be negative unless listed above     Past Medical History:  Diagnosis Date  . 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 Colon cancer ([place]) 2003   adenocarcinoma of the ascending colon, 2007  . Environmental allergies   . High cholesterol   . Hypertension   . Large cell lymphoma ([place]) 2007   Diffuse large cell lymphoma, completed CHOP and Rituxan in March 2004  . Non Hodgkin's lymphoma ([place]) 2003  . Personal history of chemotherapy 2003   Non Hodgkins  . Skin cancer     Past Surgical History:  Procedure Laterality Date  . BREAST BIOPSY Right 03/18/2016   DCIS  . BREAST BIOPSY Right 08/19/2017   ribbon marker, Fibrocystic changes- neg  . BREAST LUMPECTOMY Right 04/24/2016   DCIS clear margins. SN biopsy-negative. Declined rad tx.   Marland Kitchen BREAST LUMPECTOMY WITH NEEDLE LOCALIZATION Right 04/24/2016    Procedure: BREAST LUMPECTOMY WITH NEEDLE LOCALIZATION;  Surgeon: Jules Husbands, MD;  Location: [facility] ORS;  Service: General;  Laterality: Right;  . COLON SURGERY  11/24/05   Cancer surgery  . COLONOSCOPY     2012  . COLONOSCOPY N/A 08/28/2015   Procedure: COLONOSCOPY;  Surgeon: Hulen Luster, MD;  Location: [place];  Service: Gastroenterology;  Laterality: N/A;  . DIAGNOSTIC MAMMOGRAM     May 2014  . LYMPH NODE DISSECTION  2003   neck - non-hodgkin's lymphoma  . SENTINEL NODE BIOPSY Right 04/24/2016   Procedure: SENTINEL NODE BIOPSY;  Surgeon: Jules Husbands, MD;  Location: [facility] ORS;  Service: General;  Laterality: Right;    Medications Prior to Admission  Medication Sig Dispense Refill Last Dose  . alendronate (FOSAMAX) 70 MG tablet Take 70 mg by mouth once a week.    Past Week at Unknown time  . amLODipine (NORVASC) 10 MG tablet Take 10 mg by mouth daily.    01/02/2020 at Unknown time  . aspirin 81 MG tablet Take 81 mg by mouth daily.   01/02/2020 at Unknown time  . atorvastatin (LIPITOR) 10 MG tablet Take 10 mg by mouth daily.   01/02/2020 at Unknown time  . HYDROcodone-acetaminophen (NORCO/VICODIN) 5-325 MG tablet Take 1 tablet by mouth every 6 (six) hours as needed for pain.   01/02/2020 at Unknown time  . lisinopril (PRINIVIL,ZESTRIL) 20 MG tablet Take 20 mg by mouth daily.   01/02/2020 at Unknown time  . tamoxifen (NOLVADEX) 20 MG tablet TAKE 1 TABLET DAILY (Patient taking  differently: Take 20 mg by mouth daily. ) 90 tablet 0 01/02/2020 at Unknown time  . traMADol (ULTRAM) 50 MG tablet Take 50 mg by mouth every 6 (six) hours as needed.   01/02/2020 at Unknown time  . Vitamin D, Ergocalciferol, (DRISDOL) 1.25 MG (50000 UT) CAPS capsule Take 50,000 Units by mouth every 7 (seven) days.    Past Week at Unknown time  . acetaminophen (TYLENOL) 500 MG tablet Take 500 mg by mouth every 6 (six) hours as needed.   unknown at prn   Social History   Socioeconomic History  . Marital status: Widowed     Spouse name: Not on file  . Number of children: Not on file  . Years of education: Not on file  . Highest education level: Not on file  Occupational History  . Not on file  Tobacco Use  . Smoking status: Never Smoker  . Smokeless tobacco: Never Used  Vaping Use  . Vaping Use: Never used  Substance and Sexual Activity  . Alcohol use: No  . Drug use: No  . Sexual activity: Never  Other Topics Concern  . Not on file  Social History Narrative  . Not on file   Social Determinants of Health   Financial Resource Strain:   . Difficulty of Paying Living Expenses:   Food Insecurity:   . Worried About Charity fundraiser in the Last Year:   . Arboriculturist in the Last Year:   Transportation Needs:   . Film/video editor (Medical):   Marland Kitchen Lack of Transportation (Non-Medical):   Physical Activity:   . Days of Exercise per Week:   . Minutes of Exercise per Session:   Stress:   . Feeling of Stress :   Social Connections:   . Frequency of Communication with Friends and Family:   . Frequency of Social Gatherings with Friends and Family:   . Attends Religious Services:   . Active Member of Clubs or Organizations:   . Attends Archivist Meetings:   Marland Kitchen Marital Status:   Intimate Partner Violence:   . Fear of Current or Ex-Partner:   . Emotionally Abused:   Marland Kitchen Physically Abused:   . Sexually Abused:     Family History  Problem Relation Age of Onset  . Breast cancer Sister 25       half sister  . Cancer Other        family history of colon cancer, breast cancer, lung cancer  . Cancer Paternal Uncle        colon  . Colon cancer Maternal Uncle   . Stomach cancer Maternal Grandmother       Review of systems complete and found to be negative unless listed above      PHYSICAL EXAM  General: Well developed, well nourished, in no acute distress HEENT:  Normocephalic and atramatic Neck:  No JVD.  Lungs: Clear bilaterally to auscultation and percussion. Heart:  HRRR . Normal S1 and S2 without gallops or murmurs.  Abdomen: Bowel sounds are positive, abdomen soft and non-tender  Msk:  Back normal, normal gait. Normal strength and tone for age. Extremities: No clubbing, cyanosis or edema.   Neuro: Alert and oriented X 3. Psych:  Good affect, responds appropriately  Labs:   Lab Results  Component Value Date   WBC 5.5 01/05/2020   HGB 11.8 (L) 01/05/2020   HCT 35.8 (L) 01/05/2020   MCV 95.5 01/05/2020   PLT 214 01/05/2020  Recent Labs  Lab 01/04/20 1936 01/04/20 1936 01/05/20 0534  NA 139   < > 141  K 3.4*   < > 3.7  CL 104   < > 106  CO2 25   < > 27  BUN 18   < > 21  CREATININE 1.03*   < > 0.98  CALCIUM 8.6*   < > 8.6*  PROT 6.3*  --   --   BILITOT 0.5  --   --   ALKPHOS 69  --   --   ALT 20  --   --   AST 25  --   --   GLUCOSE 179*   < > 96   < > = values in this interval not displayed.   No results found for: CKTOTAL, CKMB, CKMBINDEX, TROPONINI No results found for: CHOL No results found for: HDL No results found for: LDLCALC No results found for: TRIG No results found for: CHOLHDL No results found for: LDLDIRECT    Radiology: DG Chest 1 View  Result Date: 01/04/2020 CLINICAL DATA:  Difficulty breathing. Atrial fibrillation. History of lymphoma EXAM: CHEST  1 VIEW COMPARISON:  PET-CT July 23, 2010 FINDINGS: There is diffuse interstitial thickening. There is no frank edema or airspace opacity. Heart size and pulmonary vascularity are normal. There is aortic atherosclerosis. No adenopathy. No bone lesions. IMPRESSION: Diffuse interstitial thickening. Suspect a degree of chronic bronchitis. No edema or airspace opacity. Heart size normal. Aortic Atherosclerosis (ICD10-I70.0). Electronically Signed   By: Lowella Grip III M.D.   On: 01/04/2020 20:20   DG Sacrum/Coccyx  Result Date: 01/04/2020 CLINICAL DATA:  Fracture, sacral augmentation EXAM: SACRUM AND COCCYX - 2+ VIEW; DG C-ARM 1-60 MIN COMPARISON:  01/02/2020  FINDINGS: Eleven fluoroscopic images are obtained during the performance of the procedure and are submitted for interpretation only. Images demonstrate cement augmentation of bilateral sacral ale are fractures. Alignment appears anatomic. FLUOROSCOPY TIME:  33.6 seconds IMPRESSION: 1. Intraoperative exam as above. Electronically Signed   By: Randa Ngo M.D.   On: 01/04/2020 16:46   CT ANGIO CHEST PE W OR WO CONTRAST  Result Date: 01/04/2020 CLINICAL DATA:  Shortness of breath and hypoxia. EXAM: CT ANGIOGRAPHY CHEST WITH CONTRAST TECHNIQUE: Multidetector CT imaging of the chest was performed using the standard protocol during bolus administration of intravenous contrast. Multiplanar CT image reconstructions and MIPs were obtained to evaluate the vascular anatomy. CONTRAST:  74mL OMNIPAQUE IOHEXOL 350 MG/ML SOLN COMPARISON:  Chest radiography same day FINDINGS: Cardiovascular: Cardiomegaly. Coronary artery calcification. Aortic atherosclerosis. Pulmonary arterial opacification is good. No pulmonary emboli. Mediastinum/Nodes: No mediastinal or hilar mass or lymphadenopathy. Lungs/Pleura: Small amount of dependent pleural fluid. Mild dependent pulmonary atelectasis. Suspicion interstitial pulmonary edema. Upper Abdomen: Negative Musculoskeletal: Ordinary degenerative changes of the thoracic spine. Review of the MIP images confirms the above findings. IMPRESSION: No pulmonary emboli. Cardiomegaly, aortic atherosclerosis and coronary artery calcification. Probable interstitial pulmonary edema. Small effusions with mild dependent pulmonary atelectasis. Electronically Signed   By: Nelson Chimes M.D.   On: 01/04/2020 21:29   MR LUMBAR SPINE WO CONTRAST  Result Date: 01/02/2020 CLINICAL DATA:  Low back pain.  Recent falling. EXAM: MRI LUMBAR SPINE WITHOUT CONTRAST TECHNIQUE: Multiplanar, multisequence MR imaging of the lumbar spine was performed. No intravenous contrast was administered. COMPARISON:  None.  FINDINGS: Segmentation:  5 lumbar type vertebral bodies. Alignment: 3 mm degenerative anterolisthesis L4-5. 11 mm anterolisthesis L5-S1 due to chronic facet arthropathy and possibly a left-sided pars defect. Vertebrae: Old  compression deformity at L2. Recent sacral fracture, described completely by the pelvic MRI. Conus medullaris and cauda equina: Conus extends to the L1-2 level. Conus and cauda equina appear normal. Paraspinal and other soft tissues: Negative Disc levels: L1-2: Shallow disc protrusion indents the thecal sac slightly but does not cause compressive stenosis. L2-3: Normal L3-4: Minimal noncompressive disc bulge.  Mild facet osteoarthritis. L4-5: Chronic facet osteoarthritis with 3 mm of anterolisthesis. Bulging of the disc. Stenosis of the lateral recesses and neural foramina. Potential for neural compression in the intervertebral foramen on the right. L5-S1: 11 mm anterolisthesis. Chronic disc degeneration with loss of disc height. There appears to be chronic facet arthropathy on both sides. Question pars defect on the left. No compressive central canal stenosis. Bilateral foraminal narrowing could affect the exiting L5 nerves. IMPRESSION: Sacral fracture. See results of pelvic MRI. No acute lumbar fracture. Old healed fracture deformity at L2. L4-5: Chronic facet arthropathy with 3 mm of anterolisthesis. Bulging of the disc. Stenosis of the lateral recesses and foramina right more than left. In particular, the right L4 nerve could be compressed in the foramen. L5-S1: Chronic facet arthropathy and possibly pars defect on the left. Anterolisthesis of 11 mm. Chronic disc degeneration. Bilateral foraminal stenosis could affect the exiting L5 nerves. Electronically Signed   By: Nelson Chimes M.D.   On: 01/02/2020 15:32   MR PELVIS WO CONTRAST  Result Date: 01/02/2020 CLINICAL DATA:  Status post fall x2 over the past 2-3 days. Low back pain. Initial encounter. EXAM: MRI PELVIS WITHOUT CONTRAST  TECHNIQUE: Multiplanar, multisequence MR imaging of the pelvis was performed. No intravenous contrast was administered. COMPARISON:  None. FINDINGS: Bones/Joint/Cartilage Marrow edema in the sacrum due to bilateral acute or early subacute sacral fractures is identified. Edema is slightly worse in the right sacrum. No other fracture is seen. The patient has bone-on-bone right hip joint space narrowing with osteophytosis about the right femoral head and subchondral edema in the superior aspect of the femoral head. Lower lumbar degenerative change noted. No joint effusion. Ligaments Intact. Muscles and Tendons No strain or tear is identified. Atrophy of gluteal musculature noted. Small volume of fluid in the left trochanteric bursa is seen. Soft tissues No acute or focal abnormality is seen within the pelvis. IMPRESSION: Findings consistent with acute or subacute bilateral sacral fractures which appear nondisplaced. Advanced right hip osteoarthritis. Small volume of fluid in the left trochanteric bursa compatible with bursitis. Electronically Signed   By: Inge Rise M.D.   On: 01/02/2020 15:41   DG C-Arm 1-60 Min  Result Date: 01/04/2020 CLINICAL DATA:  Fracture, sacral augmentation EXAM: SACRUM AND COCCYX - 2+ VIEW; DG C-ARM 1-60 MIN COMPARISON:  01/02/2020 FINDINGS: Eleven fluoroscopic images are obtained during the performance of the procedure and are submitted for interpretation only. Images demonstrate cement augmentation of bilateral sacral ale are fractures. Alignment appears anatomic. FLUOROSCOPY TIME:  33.6 seconds IMPRESSION: 1. Intraoperative exam as above. Electronically Signed   By: Randa Ngo M.D.   On: 01/04/2020 16:46    EKG: Sinus rhythm with first-degree AV block and right bundle branch block  ASSESSMENT AND PLAN:   1.  Irregular heart rate, ECG and telemetry revealing predominant sinus rhythm with first-degree AV block, with occasional premature atrial contractions, no evidence  for atrial fibrillation 2.  Mild pulmonary edema, noted on CT, not on chest x-ray, postprocedural, asymptomatic 3.  Status post sacroplasty, awaiting ambulation  Recommendations  1.  Agree with overall current therapy 2.  No  indication for chronic anticoagulation for atrial fibrillation 3.  Lasix as needed 4.  Defer cardiac diagnostics at this time 5.  Follow-up as outpatient in 1 week   Signed: Isaias Cowman MD,PhD, [facility] 01/05/2020, 8:58 AM

## 2020-01-05 NOTE — Anesthesia Postprocedure Evaluation (Signed)
Anesthesia Post Note  Patient: Janice [name]  Procedure(s) Performed: SACROPLASTY (N/A Back)  Patient location during evaluation: PACU Anesthesia Type: General Level of consciousness: awake and alert and oriented Pain management: pain level controlled Vital Signs Assessment: post-procedure vital signs reviewed and stable Respiratory status: spontaneous breathing Cardiovascular status: blood pressure returned to baseline Anesthetic complications: no   No complications documented.   Last Vitals:  Vitals:   01/05/20 0803 01/05/20 1217  BP: 129/64 122/68  Pulse: 86 89  Resp: 17 16  Temp: 37.1 C 36.8 C  SpO2: 93% 95%    Last Pain:  Vitals:   01/05/20 1217  TempSrc: Oral  PainSc:                  Jazzlin Clements

## 2020-01-05 NOTE — Progress Notes (Signed)
*  PRELIMINARY RESULTS* Echocardiogram 2D Echocardiogram has been performed.  Janice [name] 01/05/2020, 12:01 PM

## 2020-01-05 NOTE — TOC Progression Note (Signed)
Transition of Care [facility]) - Progression Note    Patient Details  Name: FATIM VANDERSCHAAF MRN: [id number] Date of Birth: Feb 21, 1929  Transition of Care [facility]) CM/SW [place], RN Phone Number: 01/05/2020, 12:41 PM  Clinical Narrative:    Dessie Coma to check on bed availability, left a vm for a call back   Expected Discharge Plan: [place] Barriers to Discharge: Continued Medical Work up  Expected Discharge Plan and Services Expected Discharge Plan: [place]   Discharge Planning Services: CM Consult   Living arrangements for the past 2 months: Single Family Home                 DME Arranged: [place] rolling DME Agency: [employer]                   Social Determinants of Health (SDOH) Interventions    Readmission Risk Interventions No flowsheet data found.

## 2020-01-05 NOTE — TOC Progression Note (Signed)
Transition of Care [facility]) - Progression Note    Patient Details  Name: Janice [name] MRN: [id number] Date of Birth: Nov 07, 1928  Transition [facility] Care [facility]) CM/SW Contact  Su Hilt, RN Phone Number: 01/05/2020, 3:11 PM  Clinical Narrative:     Called [facility] and Spoke with [facility] will come at 5 PM to pick up the patient, the bedside nurse is aware I called [facility] and notified her The patient is fully vaccinated  I notified [facility] that the last Covid test was done at 6/15 and she stated that was fine  Expected Discharge Plan: [place] Barriers to Discharge: Continued Medical Work up  Expected Discharge Plan and Services Expected Discharge Plan: [place]   Discharge Planning Services: CM Consult   Living arrangements for the past 2 months: Single Family Home Expected Discharge Date: 01/05/20               DME Arranged: [place]ford Rile rolling DME Agency: [employer]                   Social Determinants of Health (SDOH) Interventions    Readmission Risk Interventions No flowsheet data found.

## 2020-01-05 NOTE — Progress Notes (Signed)
Occupational Therapy Treatment Patient Details Name: Janice [name] MRN: [id number] DOB: 04/22/1929 Today's Date: 01/05/2020    History of present illness 86 year old female with medical history significant of hypertension, non-Hodgkin lymphoma, skin cancer, adenocarcinoma of colon and DCIS of right breast was brought to ED from orthopedic office for lumbar spinal and pelvic MRI and pain control.  Patient was seen at orthopedic clinic today with complaint of multiple falls over the past few days.  Worsening back pain.  Family is unable to take care of her at home due to limited mobility secondary to pain. MRI pelvis with bilateral sacral fractures. Now s/p sacral plasty 6/17.   OT comments  Ms. Caporaso is making good progress towards her functional goals, but is limited by her anxiety with movement.  Pt denies pain throughout today's session.  Her HR was stable in 50-80s throughout tx, and SpO2 slightly decreased to 87-89%.  OTR increased [place] O2 from 2 to 2.5 L with activity, and pt's SpO2 increased to 94%.  OTR prompted pt to continue pursed lip breathing to maintain SpO2 and promote relaxation throughout.  Pt endorsed need to urinate and was agreeable to toilet transfer practice.  OTR provided min physical assist for pt to complete stand pivot transfer from recliner to [facility] with RW.  Pt becomes very anxious with standing, and requires max verbal cueing for safety, sequencing, and relaxation in transfers.  Pt has decreased ability to follow one-step commands with transfers 2/2 anxiety.  Pt is also impulsive with movement when standing 2/2 anxiety.  OTR educated pt on relaxation techniques to manage anxiety including deep breathing and visualization.  Pt verbalized understanding but requires constant cueing to manage anxiety with transfer training.  Pt benefits from distraction and conversation unrelated to transfer practice in order to lower anxiety.  OTR provided min assist for pt to maintain standing balance  while pt completed perihygiene independently.  OTR provided max assist for lower body dressing for pt to thread B legs through mesh underwear and pull over pt's hips while pt stood.  Pt ultimately practiced stand pivot transfers from recliner > BSC > recliner > EOB.  Pt was left supine in bed.  Pt's HR remained stable throughout tx despite transfer training and anxiety.  Ms. Frontera will continue to benefit from skilled OT services in acute setting to address anxiety management, balance, functional strengthening, safety, and independence in ADLs.  SNF remains most appropriate discharge recommendation.   Follow Up Recommendations  SNF    Equipment Recommendations  Other (comment) (defer to post acute)    Recommendations for Other Services      Precautions / Restrictions Precautions Precautions: Fall Restrictions Weight Bearing Restrictions: No Other Position/Activity Restrictions: sacroplasty        Mobility Bed Mobility Overal bed mobility: Needs Assistance Bed Mobility: Sit to Supine Rolling: Min guard Sidelying to sit: Min assist   Sit to supine: Min assist   General bed mobility comments: OTR provided min assist for BLE in sit > supine  Transfers Overall transfer level: Needs assistance Equipment used: Rolling walker (2 wheeled) Transfers: Sit to/from [facility] Sit to Stand: Min assist Stand pivot transfers: Min assist       General transfer comment: Heavy UE use, requires mod verbal cues throughout for safety, sequencing, and relaxation.    Balance Overall balance assessment: Needs assistance Sitting-balance support: Feet supported;Single extremity supported Sitting balance-Leahy Scale: Fair Sitting balance - Comments: c/o pain, needing to urinate and general anxiety  in sitting   Standing balance support: Bilateral upper extremity supported Standing balance-Leahy Scale: Poor Standing balance comment: reliant on walker, very guarded but no LOBs                            ADL either performed or assessed with clinical judgement   ADL Overall ADL's : Needs assistance/impaired Eating/Feeding: Set up;Sitting   Grooming: Set up;Sitting   Upper Body Bathing: Set up;Sitting   Lower Body Bathing: Moderate assistance;Sit to/from stand (with RW)   Upper Body Dressing : Set up;Sitting   Lower Body Dressing: Maximal assistance;Sit to/from stand (with LRAD) Lower Body Dressing Details (indicate cue type and reason): OTR provided assist to thread B legs through mesh underwear, also provided assist to pull over hips as pt stood with RW.  Pt able to pull briefs up to hips prior to standing. Toilet Transfer: Minimal assistance;Stand-pivot;BSC;Cueing for safety;Cueing for sequencing Toilet Transfer Details (indicate cue type and reason): OTR provided min physical assist for stand pivot transfer from recliner to [facility] with RW.  Pt requires significant cueing to manage anxiety, as well as for safety as pt tends to become anxious and less able to follow commands when standing. Toileting- Clothing Manipulation and Hygiene: Minimal assistance;Sit to/from stand (with RW) Toileting - Clothing Manipulation Details (indicate cue type and reason): OTR provided min assist for standing balance as pt stood to wipe peri-area, one hand on RW and one hand performing perihygiene     Functional mobility during ADLs: Minimal assistance;Cueing for safety;Cueing for sequencing;Rolling walker General ADL Comments: Pt is very limited by anxiety, grossly requires min assist for functional transfers but significant cueing for safety and sequencing     Vision Baseline Vision/History: Wears glasses;Cataracts Wears Glasses: At all times Patient Visual Report: No change from baseline     Perception     Praxis      Cognition Arousal/Alertness: Awake/alert Behavior During Therapy: Restless;Anxious Overall Cognitive Status: Within Functional Limits for tasks  assessed                                 General Comments: Pt is extremely anxious with mobility, pleasant and engaged when seated        Exercises Other Exercises Other Exercises: provided min assist for functional transfers and toileting at [facility], educated pt on relaxation techniques to manage anxiety with mobility   Shoulder Instructions       General Comments      Pertinent Vitals/ Pain       Pain Assessment: No/denies pain Faces Pain Scale: Hurts even more Pain Location: pt denies pain, does grimace in sit > supine transfer Pain Descriptors / Indicators: Constant;Grimacing;Discomfort;Moaning Pain Intervention(s): Limited activity within patient's tolerance;Monitored during session;Relaxation;Utilized relaxation techniques  Home Living Family/patient expects to be discharged to:: Skilled nursing facility Living Arrangements: Children Available Help at Discharge: Available PRN/intermittently Type of Home: House                 Bathroom Toilet: Standard     Home Equipment: Bedside commode;Walker - 2 wheels;Cane - single point          Prior Functioning/Environment Level of Independence: Needs assistance  Gait / Transfers Assistance Needed: Pt able to ambulate in house with RW or cane, has not been walking outside 2/2 fear of uneven terrain ADL's / Homemaking Assistance Needed: Pt generally mod I in  ADLs/household IADLs including bathing, dressing, simple meal prep, and medication management   Comments: Pt has been much more limited the last few weeks   Frequency  Min 2X/week        Progress Toward Goals  OT Goals(current goals can now be found in the care plan section)  Progress towards OT goals: Progressing toward goals  Acute Rehab OT Goals Patient Stated Goal: to get back to home OT Goal Formulation: With patient/family Time For Goal Achievement: 01/17/20 Potential to Achieve Goals: Good  Plan Discharge plan remains  appropriate;Frequency remains appropriate    Co-evaluation                 AM-PAC OT "6 Clicks" Daily Activity     Outcome Measure   Help from another person eating meals?: None Help from another person taking care of personal grooming?: None Help from another person toileting, which includes using toliet, bedpan, or urinal?: A Lot Help from another person bathing (including washing, rinsing, drying)?: A Lot Help from another person to put on and taking off regular upper body clothing?: None Help from another person to put on and taking off regular lower body clothing?: A Lot 6 Click Score: 18    End of Session Equipment Utilized During Treatment: Gait belt;Rolling walker  OT Visit Diagnosis: Other abnormalities of gait and mobility (R26.89);Repeated falls (R29.6);Pain;Muscle weakness (generalized) (M62.81) Pain - part of body:  (back)   Activity Tolerance Other (comment) (Pt limited by anxiety)   Patient Left in bed;with call bell/phone within reach;with bed alarm set;with family/visitor present   Nurse Communication Other (comment) (pt's urine output)        Time: 0131-4388 OT Time Calculation (min): 45 min  Charges: OT General Charges $OT Visit: 1 Visit OT Treatments $Self Care/Home Management : 38-52 mins  Myrtie Hawk Desmund Elman, OTR/L 01/05/20, 3:23 PM

## 2020-01-05 NOTE — TOC Progression Note (Signed)
Transition of Care [facility]) - Progression Note    Patient Details  Name: Janice [name] MRN: [id number] Date of Birth: Sep 01, 1928  Transition of Care [facility]) CM/SW [place], RN Phone Number: 01/05/2020, 2:52 PM  Clinical Narrative:     Notified The family that the patient will DC today to [facility], I let them know that we received Insurance auth approval,  Expected Discharge Plan: [place] Barriers to Discharge: Continued Medical Work up  Expected Discharge Plan and Services Expected Discharge Plan: [place]   Discharge Planning Services: CM Consult   Living arrangements for the past 2 months: Single Family Home Expected Discharge Date: 01/05/20               DME Arranged: Gilford Rile rolling DME Agency: [employer]                   Social Determinants of Health (SDOH) Interventions    Readmission Risk Interventions No flowsheet data found.

## 2020-01-05 NOTE — Plan of Care (Signed)
  Problem: Education: Goal: Knowledge of General Education information will improve Description: Including pain rating scale, medication(s)/side effects and non-pharmacologic comfort measures 01/05/2020 1632 by Ferrel Logan, RN Outcome: Adequate for Discharge 01/05/2020 1631 by Ferrel Logan, RN Outcome: Progressing   Problem: Health Behavior/Discharge Planning: Goal: Ability to manage health-related needs will improve 01/05/2020 1632 by Ferrel Logan, RN Outcome: Adequate for Discharge 01/05/2020 1631 by Ferrel Logan, RN Outcome: Progressing   Problem: Clinical Measurements: Goal: Ability to maintain clinical measurements within normal limits will improve 01/05/2020 1632 by Ferrel Logan, RN Outcome: Adequate for Discharge 01/05/2020 1631 by Ferrel Logan, RN Outcome: Progressing Goal: Will remain free from infection 01/05/2020 1632 by Ferrel Logan, RN Outcome: Adequate for Discharge 01/05/2020 1631 by Ferrel Logan, RN Outcome: Progressing Goal: Diagnostic test results will improve 01/05/2020 1632 by Ferrel Logan, RN Outcome: Adequate for Discharge 01/05/2020 1631 by Ferrel Logan, RN Outcome: Progressing Goal: Respiratory complications will improve 01/05/2020 1632 by Ferrel Logan, RN Outcome: Adequate for Discharge 01/05/2020 1631 by Ferrel Logan, RN Outcome: Progressing Goal: Cardiovascular complication will be avoided 01/05/2020 1632 by Ferrel Logan, RN Outcome: Adequate for Discharge 01/05/2020 1631 by Ferrel Logan, RN Outcome: Progressing   Problem: Activity: Goal: Risk for activity intolerance will decrease 01/05/2020 1632 by Ferrel Logan, RN Outcome: Adequate for Discharge 01/05/2020 1631 by Ferrel Logan, RN Outcome: Progressing   Problem: Nutrition: Goal: Adequate nutrition will be maintained 01/05/2020 1632 by Ferrel Logan, RN Outcome: Adequate for Discharge 01/05/2020 1631 by Ferrel Logan, RN Outcome: Progressing   Problem:  Coping: Goal: Level of anxiety will decrease 01/05/2020 1632 by Ferrel Logan, RN Outcome: Adequate for Discharge 01/05/2020 1631 by Ferrel Logan, RN Outcome: Adequate for Discharge   Problem: Elimination: Goal: Will not experience complications related to bowel motility 01/05/2020 1632 by Ferrel Logan, RN Outcome: Adequate for Discharge 01/05/2020 1631 by Ferrel Logan, RN Outcome: Progressing Goal: Will not experience complications related to urinary retention 01/05/2020 1632 by Ferrel Logan, RN Outcome: Adequate for Discharge 01/05/2020 1631 by Ferrel Logan, RN Outcome: Progressing   Problem: Pain Managment: Goal: General experience of comfort will improve 01/05/2020 1632 by Ferrel Logan, RN Outcome: Adequate for Discharge 01/05/2020 1631 by Ferrel Logan, RN Outcome: Progressing   Problem: Safety: Goal: Ability to remain free from injury will improve 01/05/2020 1632 by Ferrel Logan, RN Outcome: Adequate for Discharge 01/05/2020 1631 by Ferrel Logan, RN Outcome: Progressing   Problem: Skin Integrity: Goal: Risk for impaired skin integrity will decrease 01/05/2020 1632 by Ferrel Logan, RN Outcome: Adequate for Discharge 01/05/2020 1631 by Ferrel Logan, RN Outcome: Progressing   Problem: Education: Goal: Ability to state activities that reduce stress will improve Outcome: Adequate for Discharge   Problem: Coping: Goal: Ability to identify and develop effective coping behavior will improve Outcome: Adequate for Discharge   Problem: Self-Concept: Goal: Ability to identify factors that promote anxiety will improve Outcome: Adequate for Discharge Goal: Level of anxiety will decrease Outcome: Adequate for Discharge Goal: Ability to modify response to factors that promote anxiety will improve Outcome: Adequate for Discharge

## 2020-01-05 NOTE — TOC Progression Note (Addendum)
Transition of Care [facility]) - Progression Note    Patient Details  Name: BRANDALYN HARTING MRN: [id number] Date of Birth: June 29, 1929  Transition of Care [facility]) CM/SW Contact  Su Hilt, RN Phone Number: 01/05/2020, 2:20 PM  Clinical Narrative:    Spoke with the patient and her sons in the room and reviewed the bed choices in depth They chose [facility] I notified Tammy with [facility] [place] to get a Rapid review  Faxed clinical information to [phone] Ref number [id number] The patient is fully vaccinated for covid Spoke with Langley Gauss the care manager [phone] approved, start today for 5 days Next review 6/22 Melanie Poteat    Barriers to Discharge: Continued Medical Work up  Expected Discharge Plan and Services Expected Discharge Plan: [place]   Discharge Planning Services: CM Consult   Living arrangements for the past 2 months: Single Family Home                 DME Arranged: [place] rolling DME Agency: [employer]                   Social Determinants of Health (SDOH) Interventions    Readmission Risk Interventions No flowsheet data found.

## 2020-03-15 ENCOUNTER — Other Ambulatory Visit: Payer: Self-pay | Admitting: Orthopedic Surgery

## 2020-03-15 DIAGNOSIS — S22000A Wedge compression fracture of unspecified thoracic vertebra, initial encounter for closed fracture: Secondary | ICD-10-CM

## 2020-03-19 ENCOUNTER — Other Ambulatory Visit: Payer: Self-pay

## 2020-03-19 ENCOUNTER — Ambulatory Visit
Admission: RE | Admit: 2020-03-19 | Discharge: 2020-03-19 | Disposition: A | Discharge status: Home / Self Care | Payer: Medicare Other | Source: Ambulatory Visit | Attending: Orthopedic Surgery | Admitting: Orthopedic Surgery

## 2020-03-19 DIAGNOSIS — S22000A Wedge compression fracture of unspecified thoracic vertebra, initial encounter for closed fracture: Secondary | ICD-10-CM | POA: Insufficient documentation

## 2020-03-20 ENCOUNTER — Other Ambulatory Visit: Payer: Self-pay | Admitting: Orthopedic Surgery

## 2020-03-20 ENCOUNTER — Other Ambulatory Visit: Payer: Medicare Other

## 2020-03-21 ENCOUNTER — Inpatient Hospital Stay: Payer: Medicare Other | Attending: Hematology and Oncology | Admitting: Hematology and Oncology

## 2020-03-21 ENCOUNTER — Other Ambulatory Visit: Payer: Self-pay

## 2020-03-21 ENCOUNTER — Other Ambulatory Visit: Payer: Self-pay | Admitting: Hematology and Oncology

## 2020-03-21 ENCOUNTER — Inpatient Hospital Stay: Payer: Medicare Other

## 2020-03-21 DIAGNOSIS — Z801 Family history of malignant neoplasm of trachea, bronchus and lung: Secondary | ICD-10-CM | POA: Insufficient documentation

## 2020-03-21 DIAGNOSIS — I491 Atrial premature depolarization: Secondary | ICD-10-CM | POA: Insufficient documentation

## 2020-03-21 DIAGNOSIS — Z79810 Long term (current) use of selective estrogen receptor modulators (SERMs): Secondary | ICD-10-CM | POA: Insufficient documentation

## 2020-03-21 DIAGNOSIS — Z803 Family history of malignant neoplasm of breast: Secondary | ICD-10-CM | POA: Insufficient documentation

## 2020-03-21 DIAGNOSIS — I441 Atrioventricular block, second degree: Secondary | ICD-10-CM | POA: Insufficient documentation

## 2020-03-21 DIAGNOSIS — Z79899 Other long term (current) drug therapy: Secondary | ICD-10-CM | POA: Insufficient documentation

## 2020-03-21 DIAGNOSIS — Z8572 Personal history of non-Hodgkin lymphomas: Secondary | ICD-10-CM | POA: Insufficient documentation

## 2020-03-21 DIAGNOSIS — Z9221 Personal history of antineoplastic chemotherapy: Secondary | ICD-10-CM | POA: Insufficient documentation

## 2020-03-21 DIAGNOSIS — Z800 Family history of malignant neoplasm of digestive organs: Secondary | ICD-10-CM | POA: Insufficient documentation

## 2020-03-21 DIAGNOSIS — I959 Hypotension, unspecified: Secondary | ICD-10-CM | POA: Insufficient documentation

## 2020-03-21 DIAGNOSIS — K5730 Diverticulosis of large intestine without perforation or abscess without bleeding: Secondary | ICD-10-CM | POA: Insufficient documentation

## 2020-03-21 DIAGNOSIS — R000 Tachycardia, unspecified: Secondary | ICD-10-CM | POA: Insufficient documentation

## 2020-03-21 DIAGNOSIS — D0511 Intraductal carcinoma in situ of right breast: Secondary | ICD-10-CM | POA: Insufficient documentation

## 2020-03-21 DIAGNOSIS — Z85038 Personal history of other malignant neoplasm of large intestine: Secondary | ICD-10-CM | POA: Insufficient documentation

## 2020-03-21 DIAGNOSIS — M549 Dorsalgia, unspecified: Secondary | ICD-10-CM | POA: Insufficient documentation

## 2020-03-21 DIAGNOSIS — D125 Benign neoplasm of sigmoid colon: Secondary | ICD-10-CM | POA: Insufficient documentation

## 2020-03-21 DIAGNOSIS — Z85828 Personal history of other malignant neoplasm of skin: Secondary | ICD-10-CM | POA: Insufficient documentation

## 2020-03-21 DIAGNOSIS — R350 Frequency of micturition: Secondary | ICD-10-CM | POA: Insufficient documentation

## 2020-03-21 DIAGNOSIS — G8929 Other chronic pain: Secondary | ICD-10-CM | POA: Insufficient documentation

## 2020-03-21 DIAGNOSIS — R0902 Hypoxemia: Secondary | ICD-10-CM | POA: Insufficient documentation

## 2020-03-22 ENCOUNTER — Other Ambulatory Visit: Payer: Self-pay

## 2020-03-22 ENCOUNTER — Encounter
Admission: RE | Admit: 2020-03-22 | Discharge: 2020-03-22 | Disposition: A | Discharge status: Home / Self Care | Payer: Medicare Other | Source: Ambulatory Visit | Attending: Orthopedic Surgery | Admitting: Orthopedic Surgery

## 2020-03-22 NOTE — Patient Instructions (Signed)
Your procedure is scheduled on: Thursday March 28, 2020. Report to Day Surgery inside [place] 2nd floor. To find out your arrival time please call [phone] between 1PM - 3PM on Wednesday March 27, 2020.  Remember: Instructions that are not followed completely may result in serious medical risk,  up to and including death, or upon the discretion of your surgeon and anesthesiologist your  surgery may need to be rescheduled.     _X__ 1. Do not eat food after midnight the night before your procedure.                 No chewing gum or hard candies. You may drink clear liquids up to 2 hours                 before you are scheduled to arrive for your surgery- DO not drink clear                 liquids within 2 hours of the start of your surgery.                 Clear Liquids include:  water, apple juice without pulp, clear Gatorade, G2 or                  Gatorade Zero (avoid Red/Purple/Blue), Black Coffee or Tea (Do not add                 anything to coffee or tea).  __X__2.   Complete the "Ensure Clear Pre-surgery Clear Carbohydrate Drink" provided to you, 2 hours before arrival. **If you are diabetic you will be provided with an alternative drink, Gatorade Zero or G2.  __X__3.  On the morning of surgery brush your teeth with toothpaste and water, you                may rinse your mouth with mouthwash if you wish.  Do not swallow any toothpaste of mouthwash.     _X__ 4.  No Alcohol for 24 hours before or after surgery.   _X__ 5.  Do Not Smoke or use e-cigarettes For 24 Hours Prior to Your Surgery.                 Do not use any chewable tobacco products for at least 6 hours prior to                 Surgery.  _X__ 6.  Do not use any recreational drugs (marijuana, cocaine, heroin, ecstasy, MDMA or other)                For at least one week prior to your surgery.  Combination of these drugs with anesthesia                May have life threatening  results.  __X__7.  Notify your doctor if there is any change in your medical condition      (cold, fever, infections).     Do not wear jewelry, make-up, hairpins, clips or nail polish. Do not wear lotions, powders, or perfumes. You may wear deodorant. Do not shave 48 hours prior to surgery. Men may shave face and neck. Do not bring valuables to the hospital.    [facility] is not responsible for any belongings or valuables.  Contacts, dentures or bridgework may not be worn into surgery. Leave your suitcase in the car. After surgery it may be brought to your room. For patients admitted to  the hospital, discharge time is determined by your treatment team.   Patients discharged the day of surgery will not be allowed to drive home.   Make arrangements for someone to be with you for the first 24 hours of your Same Day Discharge.    Please read over the following fact sheets that you were given:   Incentive Spirometer     __X__ Take these medicines the morning of surgery with A SIP OF WATER:    1. amLODipine (NORVASC) 10 MG  2. tamoxifen (NOLVADEX) 20 MG  3. acetaminophen (TYLENOL) 500 MG      __X__ Use CHG Soap as directed  __X__ Stop aspirin as instructed by doctor  __X__ Stop Anti-inflammatories such as Ibuprofen, Aleve, Advil, naproxen and or BC powders.    __X__ Stop supplements until after surgery.    __X__ Do not start any herbal supplements before your surgery.    If you have any questions regarding your pre-procedure instructions,  Please call Pre-admit Testing at [phone].

## 2020-03-26 ENCOUNTER — Other Ambulatory Visit
Admission: RE | Admit: 2020-03-26 | Discharge: 2020-03-26 | Disposition: A | Discharge status: Home / Self Care | Payer: Medicare Other | Source: Ambulatory Visit | Attending: Orthopedic Surgery | Admitting: Orthopedic Surgery

## 2020-03-26 ENCOUNTER — Other Ambulatory Visit: Payer: Self-pay | Admitting: Hematology and Oncology

## 2020-03-26 ENCOUNTER — Other Ambulatory Visit: Payer: Self-pay

## 2020-03-26 DIAGNOSIS — Z01812 Encounter for preprocedural laboratory examination: Secondary | ICD-10-CM | POA: Insufficient documentation

## 2020-03-26 DIAGNOSIS — Z20822 Contact with and (suspected) exposure to covid-19: Secondary | ICD-10-CM | POA: Insufficient documentation

## 2020-03-27 LAB — SARS CORONAVIRUS 2 (TAT 6-24 HRS): SARS Coronavirus 2: NEGATIVE

## 2020-03-27 MED ORDER — CEFAZOLIN SODIUM-DEXTROSE 2-4 GM/100ML-% IV SOLN
2.0000 g | INTRAVENOUS | Status: AC
  Administered 2020-03-28: 2 g via INTRAVENOUS

## 2020-03-28 ENCOUNTER — Encounter: Payer: Self-pay | Admitting: Orthopedic Surgery

## 2020-03-28 ENCOUNTER — Ambulatory Visit
Admission: RE | Admit: 2020-03-28 | Discharge: 2020-03-28 | Disposition: A | Discharge status: Home / Self Care | Payer: Medicare Other | Attending: Orthopedic Surgery | Admitting: Orthopedic Surgery

## 2020-03-28 ENCOUNTER — Ambulatory Visit: Payer: Medicare Other

## 2020-03-28 ENCOUNTER — Encounter
Admission: RE | Disposition: A | Discharge status: Home / Self Care | Payer: Self-pay | Source: Home / Self Care | Attending: Orthopedic Surgery

## 2020-03-28 ENCOUNTER — Ambulatory Visit: Payer: Medicare Other | Admitting: Anesthesiology

## 2020-03-28 ENCOUNTER — Other Ambulatory Visit: Payer: Self-pay

## 2020-03-28 DIAGNOSIS — Z85038 Personal history of other malignant neoplasm of large intestine: Secondary | ICD-10-CM | POA: Insufficient documentation

## 2020-03-28 DIAGNOSIS — S22070A Wedge compression fracture of T9-T10 vertebra, initial encounter for closed fracture: Secondary | ICD-10-CM | POA: Diagnosis not present

## 2020-03-28 DIAGNOSIS — Z801 Family history of malignant neoplasm of trachea, bronchus and lung: Secondary | ICD-10-CM | POA: Diagnosis not present

## 2020-03-28 DIAGNOSIS — Z8571 Personal history of Hodgkin lymphoma: Secondary | ICD-10-CM | POA: Diagnosis not present

## 2020-03-28 DIAGNOSIS — Z7982 Long term (current) use of aspirin: Secondary | ICD-10-CM | POA: Diagnosis not present

## 2020-03-28 DIAGNOSIS — Z419 Encounter for procedure for purposes other than remedying health state, unspecified: Secondary | ICD-10-CM

## 2020-03-28 DIAGNOSIS — I10 Essential (primary) hypertension: Secondary | ICD-10-CM | POA: Diagnosis not present

## 2020-03-28 DIAGNOSIS — Z79899 Other long term (current) drug therapy: Secondary | ICD-10-CM | POA: Insufficient documentation

## 2020-03-28 DIAGNOSIS — S22080A Wedge compression fracture of T11-T12 vertebra, initial encounter for closed fracture: Secondary | ICD-10-CM | POA: Diagnosis not present

## 2020-03-28 DIAGNOSIS — Z803 Family history of malignant neoplasm of breast: Secondary | ICD-10-CM | POA: Diagnosis not present

## 2020-03-28 DIAGNOSIS — X58XXXA Exposure to other specified factors, initial encounter: Secondary | ICD-10-CM | POA: Insufficient documentation

## 2020-03-28 DIAGNOSIS — Y939 Activity, unspecified: Secondary | ICD-10-CM | POA: Diagnosis not present

## 2020-03-28 DIAGNOSIS — Z85828 Personal history of other malignant neoplasm of skin: Secondary | ICD-10-CM | POA: Insufficient documentation

## 2020-03-28 DIAGNOSIS — Z800 Family history of malignant neoplasm of digestive organs: Secondary | ICD-10-CM | POA: Insufficient documentation

## 2020-03-28 DIAGNOSIS — Z853 Personal history of malignant neoplasm of breast: Secondary | ICD-10-CM | POA: Diagnosis not present

## 2020-03-28 DIAGNOSIS — Z9221 Personal history of antineoplastic chemotherapy: Secondary | ICD-10-CM | POA: Insufficient documentation

## 2020-03-28 DIAGNOSIS — E7800 Pure hypercholesterolemia, unspecified: Secondary | ICD-10-CM | POA: Insufficient documentation

## 2020-03-28 HISTORY — PX: KYPHOPLASTY: SHX5884

## 2020-03-28 SURGERY — KYPHOPLASTY
Anesthesia: General | Site: Thoracic

## 2020-03-28 MED ORDER — KETAMINE HCL 50 MG/ML IJ SOLN
INTRAMUSCULAR | Status: DC | PRN
  Administered 2020-03-28: 15 mg via INTRAMUSCULAR
  Administered 2020-03-28: 10 mg via INTRAMUSCULAR

## 2020-03-28 MED ORDER — HYDROCODONE-ACETAMINOPHEN 5-325 MG PO TABS
1.0000 | ORAL_TABLET | Freq: Four times a day (QID) | ORAL | 0 refills | Status: DC | PRN
Start: 2020-03-28 — End: 2020-05-21

## 2020-03-28 MED ORDER — LIDOCAINE HCL 1 % IJ SOLN
INTRAMUSCULAR | Status: DC | PRN
  Administered 2020-03-28: 20 mL

## 2020-03-28 MED ORDER — MORPHINE SULFATE (PF) 2 MG/ML IV SOLN
INTRAVENOUS | Status: AC
Start: 2020-03-28 — End: 2020-03-28
  Administered 2020-03-28: 1 mg via INTRAVENOUS
  Filled 2020-03-28: qty 1

## 2020-03-28 MED ORDER — CHLORHEXIDINE GLUCONATE 0.12 % MT SOLN: 15.0000 mL | Freq: Once | OROMUCOSAL | Status: AC

## 2020-03-28 MED ORDER — FENTANYL CITRATE (PF) 100 MCG/2ML IJ SOLN
INTRAMUSCULAR | Status: DC | PRN
Start: 2020-03-28 — End: 2020-03-28
  Administered 2020-03-28 (×4): 25 ug via INTRAVENOUS

## 2020-03-28 MED ORDER — PROPOFOL 500 MG/50ML IV EMUL
INTRAVENOUS | Status: DC | PRN
  Administered 2020-03-28: 25 ug/kg/min via INTRAVENOUS

## 2020-03-28 MED ORDER — FENTANYL CITRATE (PF) 100 MCG/2ML IJ SOLN: 25.0000 ug | INTRAMUSCULAR | Status: DC | PRN

## 2020-03-28 MED ORDER — IPRATROPIUM-ALBUTEROL 0.5-2.5 (3) MG/3ML IN SOLN
RESPIRATORY_TRACT | Status: AC
  Filled 2020-03-28: qty 3

## 2020-03-28 MED ORDER — IPRATROPIUM-ALBUTEROL 0.5-2.5 (3) MG/3ML IN SOLN
3.0000 mL | Freq: Once | RESPIRATORY_TRACT | Status: AC
  Administered 2020-03-28: 3 mL via RESPIRATORY_TRACT

## 2020-03-28 MED ORDER — KETAMINE HCL 50 MG/ML IJ SOLN
INTRAMUSCULAR | Status: AC
  Filled 2020-03-28: qty 10

## 2020-03-28 MED ORDER — CHLORHEXIDINE GLUCONATE 0.12 % MT SOLN
OROMUCOSAL | Status: AC
  Administered 2020-03-28: 15 mL via OROMUCOSAL
  Filled 2020-03-28: qty 15

## 2020-03-28 MED ORDER — LACTATED RINGERS IV SOLN: INTRAVENOUS | Status: DC

## 2020-03-28 MED ORDER — FAMOTIDINE 20 MG PO TABS
ORAL_TABLET | ORAL | Status: AC
  Administered 2020-03-28: 20 mg via ORAL
  Filled 2020-03-28: qty 1

## 2020-03-28 MED ORDER — FAMOTIDINE 20 MG PO TABS: 20.0000 mg | ORAL_TABLET | Freq: Once | ORAL | Status: AC

## 2020-03-28 MED ORDER — FENTANYL CITRATE (PF) 100 MCG/2ML IJ SOLN
INTRAMUSCULAR | Status: AC
  Filled 2020-03-28: qty 2

## 2020-03-28 MED ORDER — BUPIVACAINE-EPINEPHRINE (PF) 0.5% -1:200000 IJ SOLN
INTRAMUSCULAR | Status: DC | PRN
  Administered 2020-03-28: 30 mL

## 2020-03-28 MED ORDER — MORPHINE SULFATE (PF) 2 MG/ML IV SOLN: 1.0000 mg | Freq: Once | INTRAVENOUS | Status: AC

## 2020-03-28 MED ORDER — ONDANSETRON HCL 4 MG/2ML IJ SOLN: 4.0000 mg | Freq: Once | INTRAMUSCULAR | Status: DC | PRN

## 2020-03-28 MED ORDER — ORAL CARE MOUTH RINSE: 15.0000 mL | Freq: Once | OROMUCOSAL | Status: AC

## 2020-03-28 MED ORDER — LIDOCAINE HCL (CARDIAC) PF 100 MG/5ML IV SOSY
PREFILLED_SYRINGE | INTRAVENOUS | Status: DC | PRN
  Administered 2020-03-28: 25 mg via INTRAVENOUS

## 2020-03-28 MED ORDER — CEFAZOLIN SODIUM-DEXTROSE 2-4 GM/100ML-% IV SOLN
INTRAVENOUS | Status: AC
  Filled 2020-03-28: qty 100

## 2020-03-28 MED ORDER — PROPOFOL 10 MG/ML IV BOLUS
INTRAVENOUS | Status: DC | PRN
  Administered 2020-03-28: 25 mg via INTRAVENOUS

## 2020-03-28 SURGICAL SUPPLY — 23 items
ADH SKN CLS APL DERMABOND .7 (GAUZE/BANDAGES/DRESSINGS) ×1
CEMENT KYPHON CX01A KIT/MIXER (Cement) ×3 IMPLANT
COVER WAND RF STERILE (DRAPES) ×3 IMPLANT
DERMABOND ADVANCED (GAUZE/BANDAGES/DRESSINGS) ×2
DERMABOND ADVANCED .7 DNX12 (GAUZE/BANDAGES/DRESSINGS) ×1 IMPLANT
DEVICE BIOPSY BONE KYPH (INSTRUMENTS) ×3 IMPLANT
DEVICE BIOPSY BONE KYPHX (INSTRUMENTS) IMPLANT
DRAPE C-ARM XRAY 36X54 (DRAPES) ×3 IMPLANT
DURAPREP 26ML APPLICATOR (WOUND CARE) ×3 IMPLANT
GLOVE SURG SYN 9.0  PF PI (GLOVE) ×2
GLOVE SURG SYN 9.0 PF PI (GLOVE) ×1 IMPLANT
GOWN SRG 2XL LVL 4 RGLN SLV (GOWNS) ×1 IMPLANT
GOWN STRL NON-REIN 2XL LVL4 (GOWNS) ×3
GOWN STRL REUS W/ TWL LRG LVL3 (GOWN DISPOSABLE) ×1 IMPLANT
GOWN STRL REUS W/TWL LRG LVL3 (GOWN DISPOSABLE) ×3
PACK KYPHOPLASTY (MISCELLANEOUS) ×3 IMPLANT
RENTAL RFA  GENERATOR (MISCELLANEOUS)
RENTAL RFA GENERATOR (MISCELLANEOUS) IMPLANT
STRAP SAFETY 5IN WIDE (MISCELLANEOUS) ×3 IMPLANT
SWABSTK COMLB BENZOIN TINCTURE (MISCELLANEOUS) ×3 IMPLANT
TRAY KYPHOPAK 15/2 EXPRESS (KITS) ×3 IMPLANT
TRAY KYPHOPAK 15/3 EXPRESS 1ST (MISCELLANEOUS) IMPLANT
TRAY KYPHOPAK 20/3 EXPRESS 1ST (MISCELLANEOUS) IMPLANT

## 2020-03-28 NOTE — Op Note (Signed)
Date 03/28/20  time 2:47 PM   PATIENT:  Janice [name]   PRE-OPERATIVE DIAGNOSIS:  closed wedge compression fracture of T9 and T12   POST-OPERATIVE DIAGNOSIS:  closed wedge compression fracture of T9 and T12   PROCEDURE:  Procedure(s): KYPHOPLASTY T9 and T12  SURGEON: Laurene Footman, MD   ASSISTANTS: None   ANESTHESIA:   local and MAC   EBL:  No intake/output data recorded.   BLOOD ADMINISTERED:none   DRAINS: none    LOCAL MEDICATIONS USED:  MARCAINE    and XYLOCAINE    SPECIMEN:   T9 vertebral body   DISPOSITION OF SPECIMEN:  Pathology   COUNTS:  YES   TOURNIQUET:  * No tourniquets in log *   IMPLANTS: Bone cement   DICTATION: .Dragon Dictation  patient was brought to the operating room and after adequate anesthesia was obtained the patient was placed prone.  C arm was brought in in good visualization of the affected level obtained on both AP and lateral projections.  After patient identification and timeout procedures were completed, local anesthetic was infiltrated with 10 cc 1% Xylocaine infiltrated subcutaneously.  This is done the area on the each side of the planned approach.  The back was then prepped and draped in the usual sterile manner and repeat timeout procedure carried out.  A spinal needle was brought down to the pedicle on the right side of  T9 and T12 and a 50-50 mix of 1% Xylocaine half percent Sensorcaine with epinephrine total of 20 cc injected on each side.  After allowing this to set a small incision was made and the trocar was advanced into the vertebral body in an extrapedicular fashion.  Biopsy was obtained at T9 but not at T12 Drilling was carried out balloon inserted with inflation to  1-1/2 cc at T9  and 2-1/2 cc at T12.  When the cement was appropriate consistency 1-1/2 cc were injected at T9 and 3-1/2 cc at T12 into the vertebral body without extravasation, good fill superior to inferior endplates and from right to left sides along the inferior endplate.   After the cement had set the trochar was removed and permanent C-arm views obtained.  The wound was closed with Dermabond followed by [place]:  Discharged home after recovery room   PATIENT DISPOSITION:  PACU - hemodynamically stable.

## 2020-03-28 NOTE — Anesthesia Preprocedure Evaluation (Signed)
Anesthesia Evaluation  Patient identified by MRN, date of birth, ID band Patient awake    Reviewed: Allergy & Precautions, H&P , NPO status , Patient's Chart, lab work & pertinent test results, reviewed documented beta blocker date and time   Airway Mallampati: II  TM Distance: >3 FB Neck ROM: full    Dental no notable dental hx.    Pulmonary neg pulmonary ROS,    Pulmonary exam normal breath sounds clear to auscultation       Cardiovascular Exercise Tolerance: Good hypertension, On Medications  Rhythm:regular Rate:Normal     Neuro/Psych negative neurological ROS  negative psych ROS   GI/Hepatic negative GI ROS, Neg liver ROS,   Endo/Other  negative endocrine ROS  Renal/GU negative Renal ROS  negative genitourinary   Musculoskeletal   Abdominal   Peds negative pediatric ROS (+)  Hematology negative hematology ROS (+)   Anesthesia Other Findings Past Medical History: 2017: Breast cancer ([place])     Comment:  DCIS of right breast 2003: Colon cancer ([place])     Comment:  adenocarcinoma of the ascending colon, 2007 No date: Environmental allergies No date: High cholesterol No date: Hypertension 2007: Large cell lymphoma ([place])     Comment:  Diffuse large cell lymphoma, completed CHOP and Rituxan               in March 2004 2003: Non Hodgkin's lymphoma [facility]) 2003: Personal history of chemotherapy     Comment:  Non Hodgkins No date: Skin cancer  Reproductive/Obstetrics negative OB ROS                             Anesthesia Physical  Anesthesia Plan  ASA: II  Anesthesia Plan: MAC   Post-op Pain Management:    Induction: Intravenous  PONV Risk Score and Plan: Propofol infusion  Airway Management Planned: Nasal Cannula  Additional Equipment:   Intra-op Plan:   Post-operative Plan:   Informed Consent: I have reviewed the patients History and Physical, chart, labs and discussed  the procedure including the risks, benefits and alternatives for the proposed anesthesia with the patient or authorized representative who has indicated his/her understanding and acceptance.       Plan Discussed with: CRNA  Anesthesia Plan Comments:         Anesthesia Quick Evaluation

## 2020-03-28 NOTE — Discharge Instructions (Addendum)
Remove Band-Aids on Saturday then okay to shower. Try to get up and walk all you can but avoid lifting. Pain medicine as directed. Call office if you are having problems.   AMBULATORY SURGERY  DISCHARGE INSTRUCTIONS   1) The drugs that you were given will stay in your system until tomorrow so for the next 24 hours you should not:  A) Drive an automobile B) Make any legal decisions C) Drink any alcoholic beverage   2) You may resume regular meals tomorrow.  Today it is better to start with liquids and gradually work up to solid foods.  You may eat anything you prefer, but it is better to start with liquids, then soup and crackers, and gradually work up to solid foods.   3) Please notify your doctor immediately if you have any unusual bleeding, trouble breathing, redness and pain at the surgery site, drainage, fever, or pain not relieved by medication.    4) Additional Instructions:        Please contact your physician with any problems or Same Day Surgery at [phone], Monday through Friday 6 am to 4 pm, or [place] at [facility] number at [phone].

## 2020-03-28 NOTE — Transfer of Care (Signed)
Immediate Anesthesia Transfer of Care Note  Patient: Janice [name]  Procedure(s) Performed: T9 AND T12 KYPHOPLASTY (N/A Thoracic)  Patient Location: PACU  Anesthesia Type:General  Level of Consciousness: awake  Airway & Oxygen Therapy: Patient Spontanous Breathing  Post-op Assessment: Report given to RN and Post -op Vital signs reviewed and stable  Post vital signs: Reviewed  Last Vitals:  Vitals Value Taken Time  BP 104/65 03/28/20 1450  Temp    Pulse 151 03/28/20 1450  Resp 12 03/28/20 1450  SpO2 90 % 03/28/20 1450  Vitals shown include unvalidated device data.  Last Pain:  Vitals:   03/28/20 1121  TempSrc: Temporal  PainSc: 9          Complications: No complications documented.

## 2020-03-28 NOTE — H&P (Signed)
Janice [name] is an 86 y.o. female.   Chief Complaint: Severe back pain HPI: Patient is a 86 year old who is her prior compression fractures.  Prior surgery few months ago for sacral insufficiency fracture gave some relief.  She had increasing pain recently an MRI was ordered and it shows acute T9 and T12 fractures with T9 having approximately 50% collapse 25% 1812.  She is having severe pain in these areas and it has limited her mobility and ability to care for self at home.  She is being brought in for outpatient surgery for T9 and  12 kyphoplasty  Past Medical History:  Diagnosis Date  . 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 Colon cancer ([place]) 2003   adenocarcinoma of the ascending colon, 2007  . Environmental allergies   . High cholesterol   . Hypertension   . Large cell lymphoma ([place]) 2007   Diffuse large cell lymphoma, completed [facility] and Rituxan in March 2004  . Non Hodgkin's lymphoma ([place]) 2003  . Personal history of chemotherapy 2003   Non Hodgkins  . Skin cancer     Past Surgical History:  Procedure Laterality Date  . BREAST BIOPSY Right 03/18/2016   DCIS  . BREAST BIOPSY Right 08/19/2017   ribbon marker, Fibrocystic changes- neg  . BREAST LUMPECTOMY Right 04/24/2016   DCIS clear margins. SN biopsy-negative. Declined rad tx.   Marland Kitchen BREAST LUMPECTOMY WITH NEEDLE LOCALIZATION Right 04/24/2016   Procedure: BREAST LUMPECTOMY WITH NEEDLE LOCALIZATION;  Surgeon: Jules Husbands, MD;  Location: [facility];  Service: General;  Laterality: Right;  . COLON SURGERY  11/24/05   Cancer surgery  . COLONOSCOPY     2012  . COLONOSCOPY N/A 08/28/2015   Procedure: COLONOSCOPY;  Surgeon: Hulen Luster, MD;  Location: [facility];  Service: Gastroenterology;  Laterality: N/A;  . DIAGNOSTIC MAMMOGRAM     May 2014  . LYMPH NODE DISSECTION  2003   neck - non-hodgkin's lymphoma  . SACROPLASTY N/A 01/04/2020   Procedure: SACROPLASTY;  Surgeon: Hessie Knows, MD;  Location: [facility];   Service: Orthopedics;  Laterality: N/A;  . SENTINEL NODE BIOPSY Right 04/24/2016   Procedure: SENTINEL NODE BIOPSY;  Surgeon: Jules Husbands, MD;  Location: [facility];  Service: General;  Laterality: Right;    Family History  Problem Relation Age of Onset  . Breast cancer Sister 54       half sister  . Cancer Other        family history of colon cancer, breast cancer, lung cancer  . Cancer Paternal Uncle        colon  . Colon cancer Maternal Uncle   . Stomach cancer Maternal Grandmother    Social History:  reports that she has never smoked. She has never used smokeless tobacco. She reports that she does not drink alcohol and does not use drugs.  Allergies: No Known Allergies  Medications Prior to Admission  Medication Sig Dispense Refill  . acetaminophen (TYLENOL) 500 MG tablet Take 500 mg by mouth every 6 (six) hours as needed.    Marland Kitchen alendronate (FOSAMAX) 70 MG tablet Take 70 mg by mouth once a week.     Marland Kitchen amLODipine (NORVASC) 10 MG tablet Take 10 mg by mouth daily.     Marland Kitchen aspirin 81 MG tablet Take 81 mg by mouth daily.    Marland Kitchen atorvastatin (LIPITOR) 10 MG tablet Take 10 mg by mouth daily.    Marland Kitchen lisinopril (PRINIVIL,ZESTRIL)  20 MG tablet Take 20 mg by mouth daily.    . tamoxifen (NOLVADEX) 20 MG tablet TAKE 1 TABLET DAILY (Patient taking differently: Take 20 mg by mouth daily. ) 90 tablet 0  . Vitamin D, Ergocalciferol, (DRISDOL) 1.25 MG (50000 UT) CAPS capsule Take 50,000 Units by mouth every 7 (seven) days.     Marland Kitchen HYDROcodone-acetaminophen (NORCO/VICODIN) 5-325 MG tablet Take 1-2 tablets by mouth every 4 (four) hours as needed for moderate pain. (Patient not taking: Reported on 03/22/2020) 30 tablet 0  . polyethylene glycol (MIRALAX / GLYCOLAX) 17 g packet Take 17 g by mouth daily as needed for mild constipation. (Patient not taking: Reported on 03/22/2020) 14 each 0  . senna (SENOKOT) 8.6 MG TABS tablet Take 1 tablet (8.6 mg total) by mouth 2 (two) times daily. (Patient not taking: Reported on  03/22/2020) 120 tablet 0  . traMADol (ULTRAM) 50 MG tablet Take 50 mg by mouth every 6 (six) hours as needed. (Patient not taking: Reported on 03/22/2020)      No results found for this or any previous visit (from the past 48 hour(s)). No results found.  Review of Systems  Blood pressure (!) 151/71, pulse 100, temperature 98.8 F (37.1 C), temperature source Temporal, resp. rate 16, SpO2 96 %. Physical Exam  Lungs clear heart rate and rhythm Tender to percussion at T9 and T12 with kyphotic deformity present Assessment/Plan T9 and T12 compression fractures with severe pain Plan is for kyphoplasty at T9 and T12  Hessie Knows, MD 03/28/2020, 11:50 AM

## 2020-03-28 NOTE — Anesthesia Postprocedure Evaluation (Signed)
Anesthesia Post Note  Patient: [name]  Procedure(s) Performed: T9 AND T12 KYPHOPLASTY (N/A Thoracic)  Patient location during evaluation: PACU Anesthesia Type: General Level of consciousness: awake and alert and oriented Pain management: pain level controlled Vital Signs Assessment: post-procedure vital signs reviewed and stable Respiratory status: spontaneous breathing Cardiovascular status: blood pressure returned to baseline Anesthetic complications: no   No complications documented.   Last Vitals:  Vitals:   03/28/20 1506 03/28/20 1517  BP: (!) 155/72   Pulse:  89  Resp:    Temp:    SpO2: 99% 96%    Last Pain:  Vitals:   03/28/20 1517  TempSrc:   PainSc: 0-No pain                 Reynard Christoffersen

## 2020-03-29 ENCOUNTER — Encounter: Payer: Self-pay | Admitting: Orthopedic Surgery

## 2020-04-02 LAB — SURGICAL PATHOLOGY

## 2020-04-09 ENCOUNTER — Other Ambulatory Visit: Payer: Self-pay | Admitting: *Deleted

## 2020-04-09 MED ORDER — TAMOXIFEN CITRATE 20 MG PO TABS
20.0000 mg | ORAL_TABLET | Freq: Every day | ORAL | 0 refills | Status: DC
Start: 2020-04-09 — End: 2020-04-22

## 2020-04-11 ENCOUNTER — Other Ambulatory Visit (HOSPITAL_COMMUNITY): Payer: Self-pay | Admitting: Orthopedic Surgery

## 2020-04-11 ENCOUNTER — Other Ambulatory Visit: Payer: Self-pay | Admitting: Orthopedic Surgery

## 2020-04-11 DIAGNOSIS — Z9889 Other specified postprocedural states: Secondary | ICD-10-CM

## 2020-04-11 DIAGNOSIS — S22070A Wedge compression fracture of T9-T10 vertebra, initial encounter for closed fracture: Secondary | ICD-10-CM

## 2020-04-16 NOTE — Progress Notes (Signed)
[facility]  [address] [place], [place] [address] Phone: [phone]  Fax: [phone]   Clinic Day:  04/17/2020  Referring physician: Ezequiel Kayser, MD  Chief Complaint: Janice [name] is a 86 y.o. female with high grade right breast DCIS who is seen for 7 month assessment.   HPI: The patient was last seen in the medical oncology clinic on 09/20/2019. At that time,  she felt "ok".  She denied any breast concerns.  Exam was stable. Hematocrit was 37.4, hemoglobin 12.2, platelets 206,000, WBC 4,800. BUN was 24. Creatinine was 1.12 (CrCl 44 ml/min). Calcium was 8.5. She continued tamoxifen.  Diagnostic mammogram on 10/09/2019 revealed no evidence of malignancy. Right lumpectomy site was stable.  She was admitted to [facility] from 01/02/2020 - 01/05/2020 for a sacral fracture after multiple falls. Pelvis MRI revealed bilateral sacral fractures.  She underwent sacroplasty by Dr. Rudene Christians. She developed mild hypotension, tachycardia, and hypoxia postoperatively. CT angiogram was negative for pulmonary embolism. She was discharged to a rehab facility.   Echo on 01/05/2020 revealed an EF of 60-65%.  The patient established care with Dr. Saralyn Pilar on 02/02/2020. 72-hour Holter monitor revealed predominant sinus rhythm with mean heart rate of 83 bpm.  She had occasional premature atrial contractions and premature ventricular contractions. She had infrequent episodes of Mobitz 1 second-degree AV block.  She underwent T9 and T12 kyphoplasty on 03/28/2020 by Dr. Rudene Christians. Pathology showed mildly disrupted trabecular bone with significant periosteal reaction, compatible with healing fracture. There was trilineage hematopoiesis with small clusters of plasma cells, favor polytypic. CD138 highlighted an increased proportion of plasma cells, estimated at 10-20% of total cellularity, both singly and in small clusters. IHC testing showed a mix of kappa and lambda light chain expression  (kappa>lambda), most suggestive of a polytypic process. Features favored a polytypic plasmacytosis, however, evaluation is limited by specimen hypocellularity. If there is concern for an evolving neoplasm, serum protein electrophoresis, and or bone marrow biopsy would provide more accurate results.  During the interim, she has been "not too good." She reports back pain (9/10) and states that they think she has another thoracic fracture. She struggles to stand up because of the pain. She has urinary frequency. She denies any other symptoms.  The patient needs a refill of her tamoxifen. Her son is not sure when she ran out because he recently began managing her medications. He was able to get her a 30 day prescription but they would not give her 90 days until she saw her oncologist. The patient does not perform monthly breast self exams.  The patient spends almost all of her time in bed due to her back pain. She uses a bedside toilet to urinate and goes to the bathroom for bowel movements. She uses a sponge at the sink to bathe herself because she is unable to get into the bathtub. She can get dressed by herself except for her shoes.   Past Medical History:  Diagnosis Date  . Breast cancer ([place]) 2017   DCIS of right breast  . Colon cancer ([place]) 2003   adenocarcinoma of the ascending colon, 2007  . Environmental allergies   . High cholesterol   . Hypertension   . Large cell lymphoma ([place]) 2007   Diffuse large cell lymphoma, completed CHOP and Rituxan in March 2004  . Non Hodgkin's lymphoma () 2003  . Personal history of chemotherapy 2003   Non Hodgkins  . Skin cancer     Past Surgical  History:  Procedure Laterality Date  . BREAST BIOPSY Right 03/18/2016   DCIS  . BREAST BIOPSY Right 08/19/2017   ribbon marker, Fibrocystic changes- neg  . BREAST LUMPECTOMY Right 04/24/2016   DCIS clear margins. SN biopsy-negative. Declined rad tx.   Marland Kitchen BREAST LUMPECTOMY WITH NEEDLE LOCALIZATION Right  04/24/2016   Procedure: BREAST LUMPECTOMY WITH NEEDLE LOCALIZATION;  Surgeon: Jules Husbands, MD;  Location: [facility];  Service: General;  Laterality: Right;  . COLON SURGERY  11/24/05   Cancer surgery  . COLONOSCOPY     2012  . COLONOSCOPY N/A 08/28/2015   Procedure: COLONOSCOPY;  Surgeon: Hulen Luster, MD;  Location: [place];  Service: Gastroenterology;  Laterality: N/A;  . DIAGNOSTIC MAMMOGRAM     May 2014  . KYPHOPLASTY N/A 03/28/2020   Procedure: T9 AND T12 KYPHOPLASTY;  Surgeon: Hessie Knows, MD;  Location: [facility];  Service: Orthopedics;  Laterality: N/A;  . LYMPH NODE DISSECTION  2003   neck - non-hodgkin's lymphoma  . SACROPLASTY N/A 01/04/2020   Procedure: SACROPLASTY;  Surgeon: Hessie Knows, MD;  Location: [facility];  Service: Orthopedics;  Laterality: N/A;  . SENTINEL NODE BIOPSY Right 04/24/2016   Procedure: SENTINEL NODE BIOPSY;  Surgeon: Jules Husbands, MD;  Location: [facility];  Service: General;  Laterality: Right;    Family History  Problem Relation Age of Onset  . Breast cancer Sister 45       half sister  . Cancer Other        family history of colon cancer, breast cancer, lung cancer  . Cancer Paternal Uncle        colon  . Colon cancer Maternal Uncle   . Stomach cancer Maternal Grandmother     Social History:  reports that she has never smoked. She has never used smokeless tobacco. She reports that she does not drink alcohol and does not use drugs. She denies any known exposure to radiation or toxins. 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 She lives in [place] with her sons. The patient is accompanied by one son in person and another via iPad today.  Allergies: No Known Allergies  Current Medications: Current Outpatient Medications  Medication Sig Dispense Refill  . acetaminophen (TYLENOL) 500 MG tablet Take 500 mg by mouth every 6 (six) hours as needed.    Marland Kitchen alendronate (FOSAMAX) 70 MG tablet Take 70 mg  by mouth once a week.     Marland Kitchen amLODipine (NORVASC) 10 MG tablet Take 10 mg by mouth daily.     Marland Kitchen aspirin 81 MG tablet Take 81 mg by mouth daily.    Marland Kitchen atorvastatin (LIPITOR) 10 MG tablet Take 10 mg by mouth daily.    Marland Kitchen HYDROcodone-acetaminophen (NORCO/VICODIN) 5-325 MG tablet Take 1 tablet by mouth every 6 (six) hours as needed for moderate pain. 15 tablet 0  . lisinopril (PRINIVIL,ZESTRIL) 20 MG tablet Take 20 mg by mouth daily.    . tamoxifen (NOLVADEX) 20 MG tablet Take 1 tablet (20 mg total) by mouth daily. Patient MUST make an appointment to be seen before another refill will be approved 30 tablet 0  . polyethylene glycol (MIRALAX / GLYCOLAX) 17 g packet Take 17 g by mouth daily as needed for mild constipation. (Patient not taking: Reported on 03/22/2020) 14 each 0  . senna (SENOKOT) 8.6 MG TABS tablet Take 1 tablet (8.6 mg total) by mouth 2 (two) times daily. (Patient not taking:  Reported on 03/22/2020) 120 tablet 0  . Vitamin D, Ergocalciferol, (DRISDOL) 1.25 MG (50000 UT) CAPS capsule Take 50,000 Units by mouth every 7 (seven) days.      No current facility-administered medications for this visit.    Review of Systems  Constitutional: Negative for chills, diaphoresis, fever, malaise/fatigue and weight loss (no new weight).       Feels "not too good." Sedentary most of the day.  HENT: Negative.  Negative for congestion, ear discharge, ear pain, hearing loss, nosebleeds, sinus pain, sore throat and tinnitus.   Eyes: Negative.  Negative for blurred vision.  Respiratory: Negative.  Negative for cough, shortness of breath and wheezing.   Cardiovascular: Negative for chest pain, palpitations, orthopnea, leg swelling and PND.  Gastrointestinal: Negative.  Negative for abdominal pain, blood in stool, constipation, diarrhea, heartburn, melena, nausea and vomiting.  Genitourinary: Positive for frequency (especially at night). Negative for dysuria, hematuria and urgency.  Musculoskeletal: Positive for  back pain (9/10). Negative for falls, joint pain (arthritis) and myalgias.  Skin: Negative.  Negative for itching and rash.  Neurological: Positive for weakness (generalized). Negative for dizziness, tingling, sensory change and headaches.  Endo/Heme/Allergies: Negative.  Does not bruise/bleed easily.  Psychiatric/Behavioral: Negative for depression, memory loss, substance abuse and suicidal ideas. The patient is not nervous/anxious and does not have insomnia.   All other systems reviewed and are negative.  Performance status (ECOG): 3  Vitals Blood pressure 121/64, pulse 88, temperature 98.8 F (37.1 C), temperature source Tympanic, resp. rate 20, SpO2 96 %.   Physical Exam Vitals and nursing note reviewed.  Constitutional:      General: She is not in acute distress.    Appearance: She is well-developed. She is not diaphoretic.     Comments: Patient examined in wheelchair.  HENT:     Head: Normocephalic and atraumatic.     Mouth/Throat:     Mouth: Mucous membranes are dry.     Pharynx: Oropharynx is clear. No oropharyngeal exudate.  Eyes:     General: No scleral icterus.    Extraocular Movements: Extraocular movements intact.     Conjunctiva/sclera: Conjunctivae normal.     Pupils: Pupils are equal, round, and reactive to light.     Comments: Hazel/brown eyes.  Neck:     Vascular: No JVD.  Cardiovascular:     Rate and Rhythm: Normal rate and regular rhythm.     Heart sounds: Normal heart sounds. No murmur heard.   Pulmonary:     Effort: Pulmonary effort is normal. No respiratory distress.     Breath sounds: Normal breath sounds. No wheezing or rales.  Chest:     Breasts:        Right: No inverted nipple, mass, nipple discharge, skin change or tenderness.        Left: Skin change (fibrocystic changes at 2 o clock) present. No inverted nipple, mass, nipple discharge or tenderness.  Abdominal:     General: Bowel sounds are normal. There is no distension.     Palpations:  Abdomen is soft. There is no mass.     Tenderness: There is no abdominal tenderness. There is no guarding or rebound.  Musculoskeletal:        General: No tenderness. Normal range of motion.     Cervical back: Normal range of motion and neck supple.     Right lower leg: No edema.     Left lower leg: No edema.  Lymphadenopathy:     Head:  Right side of head: No preauricular, posterior auricular or occipital adenopathy.     Left side of head: No preauricular, posterior auricular or occipital adenopathy.     Cervical: No cervical adenopathy.     Upper Body:     Right upper body: No supraclavicular adenopathy.     Left upper body: No supraclavicular adenopathy.     Lower Body: No right inguinal adenopathy. No left inguinal adenopathy.  Skin:    General: Skin is warm and dry.  Neurological:     Mental Status: She is alert and oriented to person, place, and time.  Psychiatric:        Behavior: Behavior normal.        Thought Content: Thought content normal.        Judgment: Judgment normal.     No visits with results within 3 Day(s) from this visit.  Latest known visit with results is:  Admission on 03/28/2020, Discharged on 03/28/2020  Component Date Value Ref Range Status  . SURGICAL PATHOLOGY 03/28/2020    Final-Edited                   Value:SURGICAL PATHOLOGY CASE: [id number] PATIENT: Janice [name] Surgical Pathology Report  Specimen Submitted: A. T9 bone; bx  Clinical History: Sacral insufficiency fracture with routine healing, subsequent encounter M84.48XD, Thoracic compression fracture, closed, initial encounter S22.000A. Thoracic compression fracture T9, T12.  DIAGNOSIS: A. BONE, T9 VERTEBRAL BODY; BIOPSY: - MILDLY DISRUPTED TRABECULAR BONE WITH SIGNIFICANT PERIOSTEAL REACTION, COMPATIBLE WITH HEALING FRACTURE. - TRILINEAGE HEMATOPOIESIS WITH SMALL CLUSTERS OF PLASMA CELLS, FAVOR POLYTYPIC.  Comment: Due to the presence of scattered plasma cell  clusters, immunohistochemical studies were performed. CD138 highlights an increased proportion of plasma cells, estimated at 10-20% of total cellularity, both singly and in small clusters. IHC testing shows a mix of kappa and lambda light chain expression (kappa>lambda), most suggestive of a polytypic process. The features favor a polytypic pl                         asmacytosis, however, evaluation is limited by specimen hypocellularity. If there is concern for an evolving neoplasm, serum protein electrophoresis, and or bone marrow biopsy would provide more accurate results.  IHC slides were prepared by [facility] for Molecular Biology and Pathology, [place], [place]. All controls stained appropriately.  This test was developed and its performance characteristics determined by [facility]. It has not been cleared or approved by the [place] Food and Drug Administration. The FDA does not require this test to go through premarket FDA review. This test is used for clinical purposes. It should not be regarded as investigational or for research. This laboratory is certified under the Clinical Laboratory Improvement Amendments (CLIA) as qualified to perform high complexity clinical laboratory testing.  GROSS DESCRIPTION: A. Labeled: T9 bone biopsy Received: Formalin Number of needle core biopsy(s): 1 Length: 1.3 cm Diameter: 0.2 cm Description: R                         eceived is a core of tan firm bone. Ink: None Entirely submitted in 1 cassette, following light decalcification.  Final Diagnosis performed by Allena Napoleon, MD.   Electronically signed 04/02/2020 12:32:58PM The electronic signature indicates that the named Attending Pathologist has evaluated the specimen Technical component performed at [place], [address], [place],  [address] Lab: [phone] Dir: Rush Farmer, MD, MMM  Professional component performed at [facility], [facility], [place],  [place], [place] [address] Lab: [phone] Dir: Dellia Nims. Rubinas, MD    Assessment:  Janice [name] is a 86 y.o. female with right breast DCIS s/p right lumpectomy on 04/24/2016.  Pathology revealed no residual DCIS, focal atypical lobular hyperplasia and columnar cell changes. Two right axillary lymph nodes were negative for malignancy.  Biopsy on 03/18/2016 showed high grade DCIS.   Right diagnostic mammogram on 02/27/2016 showed suspicious calcifications in the right breast spanning 68m.  Breast MRI on 04/16/2016 showed linear non mass enhancement extending from the skin surface of the upper-outer quadrant to the biopsy marking clip in the upper-outer quadrant and continues 1.5 cm posterior to the clip. There was no MRI evidence of left breast malignancy.  She received no adjuvant radiation due to multiple comorbidities. She began tamoxifen on 05/11/2016.   Bilateral diagnostic mammogram on 08/15/2018 revealed no evidence of malignancy.  Diagnostic mammogram on 10/09/2019 revealed no evidence of malignancy. Right lumpectomy site was stable.  She has a history of B-cell lymphoma.  She completed treatment in 2004.  Last PET scan on 07/23/2010 revealed no evidence of malignancy.   She has a history of colon cancer s/p resection in 11/2005.  Last colonoscopy on 08/28/2015 revealed one small polyp in the sigmoid colon in the distal sigmoid colon. There was diverticulosis in the sigmoid colon and in the descending colon. The examination was otherwise normal. Pathology showed a hyperplastic polyp, negative for dysplasia or malignancy.   [facility] a [facility]ral fracture after multiple falls. Pelvis MRI revealed bilateral sacral fractures.  She underwent sacroplasty.  She underwent T9 and T12 kyphoplasty on 03/28/2020. Pathology revealed mildly disrupted trabecular bone with significant periosteal reaction, compatible with healing fracture. There was trilineage  hematopoiesis with small clusters of plasma cells, favor polytypic. CD138 highlighted an increased proportion of plasma cells, estimated at 10-20% of total cellularity, both singly and in small clusters. IHC showed a mix of kappa and lambda light chain expression (kappa>lambda), most suggestive of a polytypic process. Features favored a polytypic plasmacytosis, however, evaluation was limited by specimen hypocellularity.   She received the PBagnellCOVID-19 vaccine on 09/22/2019 and 10/13/2019.  Symptomatically, she has chronic back pain s/p recent sacroplasty, T9 and T12 kyphoplasty.  Plan: 1.   Labs today: CBC with diff, CMP, myeloma panel, FLCA, LDH. 2.   Right breast DCIS             Clinically, she appears to be doing well.  Exam reveals no evidence of recurrent disease.              Diagnostic mammogram on 10/09/2019 revealed no evidence of malignancy.             She remains on tamoxifen (began 05/11/2016). 3.   History of B cell lymphoma             She denies any B symptoms.  Exam reveals no adenopathy or hepatosplenomegaly. 4.   History of colon cancer             She has a distant history of a stage I or II colon cancer.             She denies any abdominal symptoms.             Colonoscopy in 08/2015 revealed 1 small polyp.             Continue yearly surveillance. 5.  Abnormal bone marrow  Review interval pathology from T9 and T12 kyphoplasty.  Plasma cells appear polytypic.  Labs for myeloma panel and free light chains.  If additional procedures were performed by Dr. Rudene Christians would send for pathology 6.   Mammogram on 10/08/2020. 7.   RTC in 6 months for MD assessment and labs (CBC with diff, CMP).  I discussed the assessment and treatment plan with the patient.  The patient was provided an opportunity to ask questions and all were answered.  The patient agreed with the plan and demonstrated an understanding of the instructions.  The patient was advised to call back if the  symptoms worsen or if the condition fails to improve as anticipated.  I provided 21 minutes of face-to-face time during this this encounter and > 50% was spent counseling as documented under my assessment and plan.  An additional 10-12 minutes were spent reviewing her chart (Epic and Care Everywhere) including notes, labs, and imaging studies.    Lequita Asal, MD, PhD    04/17/2020, 3:40 PM  I, Mirian Mo Tufford, am acting as Education administrator for [employer]. Mike Gip, MD, PhD.  I, Wilmot Quevedo C. Mike Gip, MD, have reviewed the above documentation for accuracy and completeness, and I agree with the above.

## 2020-04-17 ENCOUNTER — Other Ambulatory Visit: Payer: Self-pay

## 2020-04-17 ENCOUNTER — Other Ambulatory Visit: Payer: Self-pay | Admitting: Hematology and Oncology

## 2020-04-17 ENCOUNTER — Inpatient Hospital Stay: Payer: Medicare Other

## 2020-04-17 ENCOUNTER — Inpatient Hospital Stay (HOSPITAL_BASED_OUTPATIENT_CLINIC_OR_DEPARTMENT_OTHER): Payer: Medicare Other | Admitting: Hematology and Oncology

## 2020-04-17 ENCOUNTER — Encounter: Payer: Self-pay | Admitting: Hematology and Oncology

## 2020-04-17 ENCOUNTER — Ambulatory Visit
Admission: RE | Admit: 2020-04-17 | Discharge: 2020-04-17 | Disposition: A | Discharge status: Home / Self Care | Payer: Medicare Other | Source: Ambulatory Visit | Attending: Orthopedic Surgery | Admitting: Orthopedic Surgery

## 2020-04-17 VITALS — BP 121/64 | HR 88 | Temp 98.8°F | Resp 20

## 2020-04-17 DIAGNOSIS — Z9889 Other specified postprocedural states: Secondary | ICD-10-CM | POA: Insufficient documentation

## 2020-04-17 DIAGNOSIS — Z85038 Personal history of other malignant neoplasm of large intestine: Secondary | ICD-10-CM | POA: Diagnosis not present

## 2020-04-17 DIAGNOSIS — Z79810 Long term (current) use of selective estrogen receptor modulators (SERMs): Secondary | ICD-10-CM | POA: Diagnosis not present

## 2020-04-17 DIAGNOSIS — I441 Atrioventricular block, second degree: Secondary | ICD-10-CM | POA: Diagnosis not present

## 2020-04-17 DIAGNOSIS — S22070A Wedge compression fracture of T9-T10 vertebra, initial encounter for closed fracture: Secondary | ICD-10-CM | POA: Diagnosis not present

## 2020-04-17 DIAGNOSIS — K5730 Diverticulosis of large intestine without perforation or abscess without bleeding: Secondary | ICD-10-CM | POA: Diagnosis not present

## 2020-04-17 DIAGNOSIS — Z8572 Personal history of non-Hodgkin lymphomas: Secondary | ICD-10-CM

## 2020-04-17 DIAGNOSIS — R350 Frequency of micturition: Secondary | ICD-10-CM | POA: Diagnosis not present

## 2020-04-17 DIAGNOSIS — Z801 Family history of malignant neoplasm of trachea, bronchus and lung: Secondary | ICD-10-CM | POA: Diagnosis not present

## 2020-04-17 DIAGNOSIS — I959 Hypotension, unspecified: Secondary | ICD-10-CM | POA: Diagnosis not present

## 2020-04-17 DIAGNOSIS — I491 Atrial premature depolarization: Secondary | ICD-10-CM | POA: Diagnosis not present

## 2020-04-17 DIAGNOSIS — R898 Other abnormal findings in specimens from other organs, systems and tissues: Secondary | ICD-10-CM

## 2020-04-17 DIAGNOSIS — Z79899 Other long term (current) drug therapy: Secondary | ICD-10-CM | POA: Diagnosis not present

## 2020-04-17 DIAGNOSIS — D0511 Intraductal carcinoma in situ of right breast: Secondary | ICD-10-CM

## 2020-04-17 DIAGNOSIS — G8929 Other chronic pain: Secondary | ICD-10-CM | POA: Diagnosis not present

## 2020-04-17 DIAGNOSIS — R000 Tachycardia, unspecified: Secondary | ICD-10-CM | POA: Diagnosis not present

## 2020-04-17 DIAGNOSIS — R0902 Hypoxemia: Secondary | ICD-10-CM | POA: Diagnosis not present

## 2020-04-17 DIAGNOSIS — Z800 Family history of malignant neoplasm of digestive organs: Secondary | ICD-10-CM | POA: Diagnosis not present

## 2020-04-17 DIAGNOSIS — M549 Dorsalgia, unspecified: Secondary | ICD-10-CM | POA: Diagnosis not present

## 2020-04-17 DIAGNOSIS — Z85828 Personal history of other malignant neoplasm of skin: Secondary | ICD-10-CM | POA: Diagnosis not present

## 2020-04-17 DIAGNOSIS — Z803 Family history of malignant neoplasm of breast: Secondary | ICD-10-CM | POA: Diagnosis not present

## 2020-04-17 DIAGNOSIS — D125 Benign neoplasm of sigmoid colon: Secondary | ICD-10-CM | POA: Diagnosis not present

## 2020-04-17 DIAGNOSIS — Z9221 Personal history of antineoplastic chemotherapy: Secondary | ICD-10-CM | POA: Diagnosis not present

## 2020-04-17 LAB — COMPREHENSIVE METABOLIC PANEL
ALT: 10 U/L (ref 0–44)
AST: 17 U/L (ref 15–41)
Albumin: 3.2 g/dL — ABNORMAL LOW (ref 3.5–5.0)
Alkaline Phosphatase: 74 U/L (ref 38–126)
Anion gap: 10 (ref 5–15)
BUN: 16 mg/dL (ref 8–23)
CO2: 26 mmol/L (ref 22–32)
Calcium: 8.3 mg/dL — ABNORMAL LOW (ref 8.9–10.3)
Chloride: 98 mmol/L (ref 98–111)
Creatinine, Ser: 0.83 mg/dL (ref 0.44–1.00)
GFR calc Af Amer: >60 mL/min (ref >60)
GFR calc non Af Amer: >60 mL/min (ref >60)
Glucose, Bld: 119 mg/dL — ABNORMAL HIGH (ref 70–99)
Potassium: 3.9 mmol/L (ref 3.5–5.1)
Sodium: 134 mmol/L — ABNORMAL LOW (ref 135–145)
Total Bilirubin: 0.4 mg/dL (ref 0.3–1.2)
Total Protein: 6.7 g/dL (ref 6.5–8.1)

## 2020-04-17 LAB — CBC WITH DIFFERENTIAL/PLATELET
Abs Immature Granulocytes: 0.01 10*3/uL (ref 0.00–0.07)
Basophils Absolute: 0 10*3/uL (ref 0.0–0.1)
Basophils Relative: 1 %
Eosinophils Absolute: 0.3 10*3/uL (ref 0.0–0.5)
Eosinophils Relative: 5 %
HCT: 34.1 % — ABNORMAL LOW (ref 36.0–46.0)
Hemoglobin: 10.8 g/dL — ABNORMAL LOW (ref 12.0–15.0)
Immature Granulocytes: 0 %
Lymphocytes Relative: 17 %
Lymphs Abs: 0.9 10*3/uL (ref 0.7–4.0)
MCH: 30 pg (ref 26.0–34.0)
MCHC: 31.7 g/dL (ref 30.0–36.0)
MCV: 94.7 fL (ref 80.0–100.0)
Monocytes Absolute: 0.6 10*3/uL (ref 0.1–1.0)
Monocytes Relative: 11 %
Neutro Abs: 3.4 10*3/uL (ref 1.7–7.7)
Neutrophils Relative %: 66 %
Platelets: 230 10*3/uL (ref 150–400)
RBC: 3.6 MIL/uL — ABNORMAL LOW (ref 3.87–5.11)
RDW: 14.6 % (ref 11.5–15.5)
WBC: 5.2 10*3/uL (ref 4.0–10.5)
nRBC: 0 % (ref 0.0–0.2)

## 2020-04-17 LAB — LACTATE DEHYDROGENASE: LDH: 150 U/L (ref 98–192)

## 2020-04-17 NOTE — Progress Notes (Signed)
Patient here for oncology follow-up appointment, expresses no complaints of extreme back pain 9/10 and urinary urgency.

## 2020-04-18 LAB — KAPPA/LAMBDA LIGHT CHAINS
Kappa free light chain: 30.6 mg/L — ABNORMAL HIGH (ref 3.3–19.4)
Kappa, lambda light chain ratio: 1.51 (ref 0.26–1.65)
Lambda free light chains: 20.2 mg/L (ref 5.7–26.3)

## 2020-04-19 LAB — MULTIPLE MYELOMA PANEL, SERUM
Albumin SerPl Elph-Mcnc: 3.5 g/dL (ref 2.9–4.4)
Albumin/Glob SerPl: 1.4 (ref 0.7–1.7)
Alpha 1: 0.3 g/dL (ref 0.0–0.4)
Alpha2 Glob SerPl Elph-Mcnc: 0.5 g/dL (ref 0.4–1.0)
B-Globulin SerPl Elph-Mcnc: 0.8 g/dL (ref 0.7–1.3)
Gamma Glob SerPl Elph-Mcnc: 0.9 g/dL (ref 0.4–1.8)
Globulin, Total: 2.6 g/dL (ref 2.2–3.9)
IgA: 191 mg/dL (ref 64–422)
IgG (Immunoglobin G), Serum: 1037 mg/dL (ref 586–1602)
IgM (Immunoglobulin M), Srm: 97 mg/dL (ref 26–217)
Total Protein ELP: 6.1 g/dL (ref 6.0–8.5)

## 2020-04-22 ENCOUNTER — Other Ambulatory Visit: Payer: Self-pay

## 2020-04-22 DIAGNOSIS — D0511 Intraductal carcinoma in situ of right breast: Secondary | ICD-10-CM

## 2020-04-22 MED ORDER — TAMOXIFEN CITRATE 20 MG PO TABS: 20.0000 mg | ORAL_TABLET | Freq: Every day | ORAL | 0 refills | Status: DC

## 2020-05-18 ENCOUNTER — Other Ambulatory Visit: Payer: Self-pay

## 2020-05-18 ENCOUNTER — Emergency Department: Payer: Medicare Other

## 2020-05-18 ENCOUNTER — Encounter: Payer: Self-pay | Admitting: Emergency Medicine

## 2020-05-18 ENCOUNTER — Ambulatory Visit
Admission: EM | Admit: 2020-05-18 | Discharge: 2020-05-18 | Disposition: A | Discharge status: Home / Self Care | Payer: Medicare Other | Source: Home / Self Care

## 2020-05-18 ENCOUNTER — Ambulatory Visit (INDEPENDENT_AMBULATORY_CARE_PROVIDER_SITE_OTHER): Payer: Medicare Other

## 2020-05-18 ENCOUNTER — Inpatient Hospital Stay
Admission: EM | Admit: 2020-05-18 | Discharge: 2020-05-21 | DRG: 291 | Disposition: A | Discharge status: Skilled Nursing Facility | Payer: Medicare Other | Attending: Internal Medicine | Admitting: Internal Medicine

## 2020-05-18 DIAGNOSIS — Y939 Activity, unspecified: Secondary | ICD-10-CM | POA: Insufficient documentation

## 2020-05-18 DIAGNOSIS — R059 Cough, unspecified: Secondary | ICD-10-CM

## 2020-05-18 DIAGNOSIS — J9601 Acute respiratory failure with hypoxia: Secondary | ICD-10-CM | POA: Diagnosis present

## 2020-05-18 DIAGNOSIS — Z7401 Bed confinement status: Secondary | ICD-10-CM | POA: Insufficient documentation

## 2020-05-18 DIAGNOSIS — Z853 Personal history of malignant neoplasm of breast: Secondary | ICD-10-CM

## 2020-05-18 DIAGNOSIS — Z8572 Personal history of non-Hodgkin lymphomas: Secondary | ICD-10-CM | POA: Insufficient documentation

## 2020-05-18 DIAGNOSIS — Z801 Family history of malignant neoplasm of trachea, bronchus and lung: Secondary | ICD-10-CM

## 2020-05-18 DIAGNOSIS — Z7982 Long term (current) use of aspirin: Secondary | ICD-10-CM | POA: Insufficient documentation

## 2020-05-18 DIAGNOSIS — I2510 Atherosclerotic heart disease of native coronary artery without angina pectoris: Secondary | ICD-10-CM | POA: Diagnosis present

## 2020-05-18 DIAGNOSIS — I509 Heart failure, unspecified: Secondary | ICD-10-CM

## 2020-05-18 DIAGNOSIS — Z800 Family history of malignant neoplasm of digestive organs: Secondary | ICD-10-CM

## 2020-05-18 DIAGNOSIS — Z79899 Other long term (current) drug therapy: Secondary | ICD-10-CM

## 2020-05-18 DIAGNOSIS — R296 Repeated falls: Secondary | ICD-10-CM | POA: Diagnosis present

## 2020-05-18 DIAGNOSIS — Z85828 Personal history of other malignant neoplasm of skin: Secondary | ICD-10-CM | POA: Insufficient documentation

## 2020-05-18 DIAGNOSIS — E785 Hyperlipidemia, unspecified: Secondary | ICD-10-CM | POA: Diagnosis present

## 2020-05-18 DIAGNOSIS — Z8571 Personal history of Hodgkin lymphoma: Secondary | ICD-10-CM

## 2020-05-18 DIAGNOSIS — Z20822 Contact with and (suspected) exposure to covid-19: Secondary | ICD-10-CM | POA: Insufficient documentation

## 2020-05-18 DIAGNOSIS — E876 Hypokalemia: Secondary | ICD-10-CM | POA: Diagnosis present

## 2020-05-18 DIAGNOSIS — R5383 Other fatigue: Secondary | ICD-10-CM | POA: Insufficient documentation

## 2020-05-18 DIAGNOSIS — R410 Disorientation, unspecified: Secondary | ICD-10-CM | POA: Diagnosis present

## 2020-05-18 DIAGNOSIS — R0902 Hypoxemia: Secondary | ICD-10-CM

## 2020-05-18 DIAGNOSIS — R0689 Other abnormalities of breathing: Secondary | ICD-10-CM | POA: Diagnosis not present

## 2020-05-18 DIAGNOSIS — R0781 Pleurodynia: Secondary | ICD-10-CM | POA: Insufficient documentation

## 2020-05-18 DIAGNOSIS — I10 Essential (primary) hypertension: Secondary | ICD-10-CM | POA: Diagnosis present

## 2020-05-18 DIAGNOSIS — I110 Hypertensive heart disease with heart failure: Principal | ICD-10-CM | POA: Diagnosis present

## 2020-05-18 DIAGNOSIS — I480 Paroxysmal atrial fibrillation: Secondary | ICD-10-CM | POA: Diagnosis present

## 2020-05-18 DIAGNOSIS — Z803 Family history of malignant neoplasm of breast: Secondary | ICD-10-CM

## 2020-05-18 DIAGNOSIS — Z7189 Other specified counseling: Secondary | ICD-10-CM

## 2020-05-18 DIAGNOSIS — E7800 Pure hypercholesterolemia, unspecified: Secondary | ICD-10-CM | POA: Diagnosis present

## 2020-05-18 DIAGNOSIS — Z85038 Personal history of other malignant neoplasm of large intestine: Secondary | ICD-10-CM | POA: Insufficient documentation

## 2020-05-18 DIAGNOSIS — R0602 Shortness of breath: Secondary | ICD-10-CM | POA: Insufficient documentation

## 2020-05-18 DIAGNOSIS — R531 Weakness: Secondary | ICD-10-CM

## 2020-05-18 DIAGNOSIS — S0083XA Contusion of other part of head, initial encounter: Secondary | ICD-10-CM | POA: Insufficient documentation

## 2020-05-18 DIAGNOSIS — E871 Hypo-osmolality and hyponatremia: Secondary | ICD-10-CM | POA: Diagnosis present

## 2020-05-18 DIAGNOSIS — Y929 Unspecified place or not applicable: Secondary | ICD-10-CM | POA: Insufficient documentation

## 2020-05-18 DIAGNOSIS — Z515 Encounter for palliative care: Secondary | ICD-10-CM

## 2020-05-18 DIAGNOSIS — I119 Hypertensive heart disease without heart failure: Secondary | ICD-10-CM | POA: Insufficient documentation

## 2020-05-18 DIAGNOSIS — D0511 Intraductal carcinoma in situ of right breast: Secondary | ICD-10-CM | POA: Diagnosis present

## 2020-05-18 DIAGNOSIS — Z66 Do not resuscitate: Secondary | ICD-10-CM | POA: Diagnosis present

## 2020-05-18 DIAGNOSIS — Z9221 Personal history of antineoplastic chemotherapy: Secondary | ICD-10-CM

## 2020-05-18 DIAGNOSIS — Z993 Dependence on wheelchair: Secondary | ICD-10-CM | POA: Insufficient documentation

## 2020-05-18 DIAGNOSIS — Z8249 Family history of ischemic heart disease and other diseases of the circulatory system: Secondary | ICD-10-CM

## 2020-05-18 DIAGNOSIS — I5033 Acute on chronic diastolic (congestive) heart failure: Secondary | ICD-10-CM | POA: Diagnosis present

## 2020-05-18 DIAGNOSIS — R627 Adult failure to thrive: Secondary | ICD-10-CM | POA: Diagnosis present

## 2020-05-18 LAB — CBC WITH DIFFERENTIAL/PLATELET
Abs Immature Granulocytes: 0.02 10*3/uL (ref 0.00–0.07)
Basophils Absolute: 0 10*3/uL (ref 0.0–0.1)
Basophils Relative: 1 %
Eosinophils Absolute: 0.2 10*3/uL (ref 0.0–0.5)
Eosinophils Relative: 3 %
HCT: 37.3 % (ref 36.0–46.0)
Hemoglobin: 11.9 g/dL — ABNORMAL LOW (ref 12.0–15.0)
Immature Granulocytes: 0 %
Lymphocytes Relative: 13 %
Lymphs Abs: 0.6 10*3/uL — ABNORMAL LOW (ref 0.7–4.0)
MCH: 30.4 pg (ref 26.0–34.0)
MCHC: 31.9 g/dL (ref 30.0–36.0)
MCV: 95.4 fL (ref 80.0–100.0)
Monocytes Absolute: 0.5 10*3/uL (ref 0.1–1.0)
Monocytes Relative: 11 %
Neutro Abs: 3.4 10*3/uL (ref 1.7–7.7)
Neutrophils Relative %: 72 %
Platelets: 199 10*3/uL (ref 150–400)
RBC: 3.91 MIL/uL (ref 3.87–5.11)
RDW: 14.6 % (ref 11.5–15.5)
WBC: 4.8 10*3/uL (ref 4.0–10.5)
nRBC: 0 % (ref 0.0–0.2)

## 2020-05-18 LAB — COMPREHENSIVE METABOLIC PANEL
ALT: 11 U/L (ref 0–44)
AST: 17 U/L (ref 15–41)
Albumin: 3.7 g/dL (ref 3.5–5.0)
Alkaline Phosphatase: 58 U/L (ref 38–126)
Anion gap: 11 (ref 5–15)
BUN: 12 mg/dL (ref 8–23)
CO2: 26 mmol/L (ref 22–32)
Calcium: 8.8 mg/dL — ABNORMAL LOW (ref 8.9–10.3)
Chloride: 99 mmol/L (ref 98–111)
Creatinine, Ser: 0.86 mg/dL (ref 0.44–1.00)
GFR, Estimated: >60 mL/min (ref >60)
Glucose, Bld: 124 mg/dL — ABNORMAL HIGH (ref 70–99)
Potassium: 3.9 mmol/L (ref 3.5–5.1)
Sodium: 136 mmol/L (ref 135–145)
Total Bilirubin: 0.8 mg/dL (ref 0.3–1.2)
Total Protein: 7.2 g/dL (ref 6.5–8.1)

## 2020-05-18 LAB — BRAIN NATRIURETIC PEPTIDE: B Natriuretic Peptide: 531.3 pg/mL — ABNORMAL HIGH (ref 0.0–100.0)

## 2020-05-18 LAB — TROPONIN I (HIGH SENSITIVITY)
Troponin I (High Sensitivity): 10 ng/L (ref <18)
Troponin I (High Sensitivity): 11 ng/L (ref <18)

## 2020-05-18 LAB — FIBRIN DERIVATIVES D-DIMER (ARMC ONLY): Fibrin derivatives D-dimer (ARMC): 1267.49 ng/mL (FEU) — ABNORMAL HIGH (ref 0.00–499.00)

## 2020-05-18 MED ORDER — IOHEXOL 350 MG/ML SOLN
75.0000 mL | Freq: Once | INTRAVENOUS | Status: AC | PRN
  Administered 2020-05-18: 75 mL via INTRAVENOUS

## 2020-05-18 MED ORDER — ACETAMINOPHEN 325 MG PO TABS
650.0000 mg | ORAL_TABLET | Freq: Once | ORAL | Status: AC
  Administered 2020-05-18: 650 mg via ORAL
  Filled 2020-05-18: qty 2

## 2020-05-18 MED ORDER — FUROSEMIDE 10 MG/ML IJ SOLN
20.0000 mg | Freq: Once | INTRAMUSCULAR | Status: AC
  Administered 2020-05-18: 20 mg via INTRAVENOUS
  Filled 2020-05-18: qty 4

## 2020-05-18 NOTE — ED Triage Notes (Addendum)
Pt was seen in Urgent Care in [place] today  Pt c/o cough x2 nights Pt denies SHOB, CP, dizziness or any other symptoms of illness

## 2020-05-18 NOTE — ED Provider Notes (Signed)
[facility]    CSN: [id number] Arrival date & time: 05/18/20  1358      History   Chief Complaint Chief Complaint  Patient presents with  . Cough  . chest congestion    HPI Janice [name] is a 86 y.o. female presenting with [name] for a 2-day history of congestion and cough.  [name] is presenting with Janice today.  Patient also admits to fatigue.  She denies any associated fever, headaches, nasal congestion, sore throat, dizziness, chest pain or breathing difficulty.  Denies any abdominal pain, nausea, vomiting or diarrhea.  She has not taken any medication for Janice symptoms.  Denies any known Covid exposure and is fully vaccinated for Covid.  Patient's medical history significant for right-sided breast cancer for which she is currently receiving tamoxifen therapy for.    Patient also has history of non-Hodgkin's lymphoma and colon cancer.  Patient has had 2 back surgeries within the last few months.   Patient denies any heart problems.  She does have a history of hypertension and hyperlipidemia.  She denies any previous history of DVT, PE or blood clotting disorders.  Additionally, patient does have a bruise on Janice right forehead above Janice brow, but denies knowing how that occurred.  Janice [name] says [name].  Again, she denies any associated headaches, vision changes, dizziness.  She does state that Janice balance is off but that is not a change from normal.  She uses a wheelchair most of the time.  Janice [name] says that she is basically bedridden and [name].   They do not have any other complaints or concerns today.  HPI  Past Medical History:  Diagnosis Date  . Breast cancer ([place]) 2017   DCIS of right breast  . Colon cancer ([place]) 2003   adenocarcinoma of the ascending colon, 2007  . Environmental allergies   . High cholesterol   . Hypertension   . Large cell lymphoma ([place]) 2007   Diffuse  large cell lymphoma, completed CHOP and Rituxan in March 2004  . Non Hodgkin's lymphoma ([place]) 2003  . Personal history of chemotherapy 2003   Non Hodgkins  . Skin cancer     Patient Active Problem List   Diagnosis Date Noted  . Generalized weakness 05/19/2020  . Acute respiratory failure with hypoxia ([place]) 05/18/2020  . Abnormal bone marrow examination 04/17/2020  . Hypoxia   . Closed sacral fracture ([place]) 01/02/2020  . Inadequate pain control   . Breast mass, right   . Ductal carcinoma in situ (DCIS) of right breast 03/26/2016  . History of non-Hodgkin's lymphoma 07/19/2015  . Bronchitis 11/23/2013  . History of colon cancer 11/23/2013  . HTN (hypertension) 11/23/2013  . Hypercholesterolemia 11/23/2013    Past Surgical History:  Procedure Laterality Date  . BREAST BIOPSY Right 03/18/2016   DCIS  . BREAST BIOPSY Right 08/19/2017   ribbon marker, Fibrocystic changes- neg  . BREAST LUMPECTOMY Right 04/24/2016   DCIS clear margins. SN biopsy-negative. Declined rad tx.   Marland Kitchen BREAST LUMPECTOMY WITH NEEDLE LOCALIZATION Right 04/24/2016   Procedure: BREAST LUMPECTOMY WITH NEEDLE LOCALIZATION;  Surgeon: Janice [name];  Location: [facility];  Service: General;  Laterality: Right;  . COLON SURGERY  11/24/05   Cancer surgery  . COLONOSCOPY     2012  . COLONOSCOPY N/A 08/28/2015   Procedure: COLONOSCOPY;  Surgeon: Janice Luster,  Janice [name];  Location: [place];  Service: Gastroenterology;  Laterality: N/A;  . DIAGNOSTIC MAMMOGRAM     May 2014  . KYPHOPLASTY N/A 03/28/2020   Procedure: T9 AND T12 KYPHOPLASTY;  Surgeon: Janice Knows, Janice [name];  Location: [facility];  Service: Orthopedics;  Laterality: N/A;  . LYMPH NODE DISSECTION  2003   neck - non-hodgkin's lymphoma  . SACROPLASTY N/A 01/04/2020   Procedure: SACROPLASTY;  Surgeon: Janice Knows, Janice [name];  Location: [facility];  Service: Orthopedics;  Laterality: N/A;  . SENTINEL NODE BIOPSY Right 04/24/2016   Procedure: SENTINEL NODE BIOPSY;  Surgeon:  Janice [name];  Location: [facility];  Service: General;  Laterality: Right;    OB History   No obstetric history on file.      Home Medications    Prior to Admission medications   Medication Sig Start Date End Date Taking? Authorizing Provider  acetaminophen (TYLENOL) 500 MG tablet Take 500 mg by mouth every 6 (six) hours as needed.   Yes Provider, Historical, Janice [name]  alendronate (FOSAMAX) 70 MG tablet Take 70 mg by mouth once a week.  01/06/19  Yes Provider, Historical, Janice [name]  amLODipine (NORVASC) 10 MG tablet Take 10 mg by mouth daily.  01/06/19  Yes Provider, Historical, Janice [name]  aspirin 81 MG tablet Take 81 mg by mouth daily.   Yes Provider, Historical, Janice [name]  atorvastatin (LIPITOR) 10 MG tablet Take 10 mg by mouth daily.   Yes Provider, Historical, Janice [name]  lisinopril (PRINIVIL,ZESTRIL) 20 MG tablet Take 20 mg by mouth daily.   Yes Provider, Historical, Janice [name]  tamoxifen (NOLVADEX) 20 MG tablet Take 1 tablet (20 mg total) by mouth daily. Patient MUST make an appointment to be seen before another refill will be approved 04/22/20  Yes Janice [name], Janice Second, Janice [name]  Vitamin D, Ergocalciferol, (DRISDOL) 1.25 MG (50000 UT) CAPS capsule Take 50,000 Units by mouth every 7 (seven) days.  01/06/19  Yes Provider, Historical, Janice [name]  HYDROcodone-acetaminophen (NORCO/VICODIN) 5-325 MG tablet Take 1 tablet by mouth every 6 (six) hours as needed for moderate pain. 03/28/20   Janice Knows, Janice [name]  polyethylene glycol (MIRALAX / GLYCOLAX) 17 g packet Take 17 g by mouth daily as needed for mild constipation. 01/05/20   Janice Nimrod, Janice [name]  Pseudoeph-Doxylamine-DM-APAP (NYQUIL PO) Take 30 mLs by mouth daily as needed (cough).    Provider, Historical, Janice [name]  senna (SENOKOT) 8.6 MG TABS tablet Take 1 tablet (8.6 mg total) by mouth 2 (two) times daily. Patient not taking: Reported on 03/22/2020 01/05/20   Janice Nimrod, Janice [name]    Family History Family History  Problem Relation Age of Onset  . Breast cancer Sister 17       half sister  . Cancer Other          family history of colon cancer, breast cancer, lung cancer  . Cancer Paternal Uncle        colon  . Colon cancer Maternal Uncle   . Stomach cancer Maternal Grandmother   . Heart attack Mother 54  . Hypertension Mother   . Other Father        "old age"    Social History Social History   Tobacco Use  . Smoking status: Never Smoker  . Smokeless tobacco: Never Used  Vaping Use  . Vaping Use: Never used  Substance Use Topics  . Alcohol use: No  . Drug use: No     Allergies   Patient has no known allergies.   Review of Systems Review of Systems  Constitutional: Positive for fatigue. Negative for chills, diaphoresis and fever.  HENT: Positive for congestion. Negative for ear pain, rhinorrhea, sinus pressure, sinus pain and sore throat.   Eyes: Negative for photophobia, pain and visual disturbance.  Respiratory: Positive for cough. Negative for shortness of breath.   Cardiovascular: Negative for chest pain.  Gastrointestinal: Negative for abdominal pain, nausea and vomiting.  Genitourinary: Negative for dysuria and frequency.  Musculoskeletal: Negative for arthralgias and myalgias.  Skin: Negative for rash.  Neurological: Negative for syncope, weakness and headaches.  Hematological: Negative for adenopathy.     Physical Exam Triage Vital Signs ED Triage Vitals  Enc Vitals Group     BP 05/18/20 1412 (!) 143/65     Pulse Rate 05/18/20 1412 86     Resp 05/18/20 1412 (!) 28     Temp 05/18/20 1412 98.3 F (36.8 C)     Temp Source 05/18/20 1412 Oral     SpO2 05/18/20 1412 94 %     Weight 05/18/20 1414 160 lb (72.6 kg)     Height 05/18/20 1414 5' 4"  (1.626 m)     Head Circumference --      Peak Flow --      Pain Score 05/18/20 1413 0     Pain Loc --      Pain Edu? --      Excl. in [place]? --    No data found.  Updated Vital Signs BP (!) 143/65 (BP Location: Right Arm)   Pulse 86   Temp 98.3 F (36.8 C) (Oral)   Resp (!) 28   Ht 5' 4"  (1.626 m)   Wt 160 lb  (72.6 kg)   SpO2 94%   BMI 27.46 kg/m       Physical Exam Vitals and nursing note reviewed.  Constitutional:      General: She is in acute distress (mild respiratory distress).     Appearance: Normal appearance. She is not ill-appearing, toxic-appearing or diaphoretic.  HENT:     Head: Normocephalic.     Comments: Small area of contusion above right eyebrow    Nose: Nose normal.     Mouth/Throat:     Mouth: Mucous membranes are moist.     Pharynx: Oropharynx is clear.  Eyes:     General: No scleral icterus.       Right eye: No discharge.        Left eye: No discharge.     Extraocular Movements: Extraocular movements intact.     Conjunctiva/sclera: Conjunctivae normal.     Pupils: Pupils are equal, round, and reactive to light.  Cardiovascular:     Rate and Rhythm: Normal rate and regular rhythm.     Heart sounds: Normal heart sounds.  Pulmonary:     Effort: Respiratory distress (increased respiratory rate) present.     Breath sounds: Wheezing (few scattered wheezes throughout) present. No rhonchi or rales.  Musculoskeletal:     Cervical back: Neck supple.  Skin:    General: Skin is dry.  Neurological:     General: No focal deficit present.     Mental Status: She is alert and oriented to person, place, and time. Mental status is at baseline.     Motor: No weakness.     Gait: Gait abnormal (wheelchair bound).  Psychiatric:        Mood and Affect: Mood normal.        Behavior: Behavior normal.        Thought Content: Thought  content normal.      UC Treatments / Results  Labs (all labs ordered are listed, but only abnormal results are displayed) Labs Reviewed  SARS CORONAVIRUS 2 (TAT 6-24 HRS)    EKG   Radiology DG Chest 2 View  Result Date: 05/18/2020 CLINICAL DATA:  Cough.  Increased respiratory rate. EXAM: CHEST - 2 VIEW COMPARISON:  01/04/2020 FINDINGS: Heart is enlarged. There is dense mitral calcification. There is minimal subsegmental atelectasis at  the lung bases. No focal consolidations or pleural effusions. Remote 2 level vertebroplasty. IMPRESSION: Cardiomegaly. Electronically Signed   By: Nolon Nations M.D.   On: 05/18/2020 14:45   CT Angio Chest PE W and/or Wo Contrast  Result Date: 05/18/2020 CLINICAL DATA:  Suspected pulmonary embolus. EXAM: CT ANGIOGRAPHY CHEST WITH CONTRAST TECHNIQUE: Multidetector CT imaging of the chest was performed using the standard protocol during bolus administration of intravenous contrast. Multiplanar CT image reconstructions and MIPs were obtained to evaluate the vascular anatomy. CONTRAST:  60m OMNIPAQUE IOHEXOL 350 MG/ML SOLN COMPARISON:  January 04, 2020 FINDINGS: Cardiovascular: Satisfactory opacification of the pulmonary arteries to the segmental level. No evidence of pulmonary embolism. Enlarged heart. Calcific atherosclerotic disease of the coronary arteries and aorta. Mitral valve annular calcifications. Mediastinum/Nodes: No enlarged mediastinal, hilar, or axillary lymph nodes. Thyroid gland, trachea, and esophagus demonstrate no significant findings. Small hiatal hernia. Lungs/Pleura: Mild pulmonary vascular congestion. No focal airspace consolidation Upper Abdomen: No acute abnormality. Musculoskeletal: Compression deformities post vertebroplasty of T9 and T12 vertebral bodies. Review of the MIP images confirms the above findings. IMPRESSION: 1. No evidence of pulmonary embolism. 2. Enlarged heart with mild pulmonary vascular congestion. 3. Calcific atherosclerotic disease of the coronary arteries and aorta. 4. No evidence of consolidation in the lungs. 5. Small hiatal hernia. 6. Compression deformities post vertebroplasty of T9 and T12 vertebral bodies. Aortic Atherosclerosis (ICD10-I70.0). Electronically Signed   By: DFidela SalisburyM.D.   On: 05/18/2020 21:20    Procedures Procedures (including critical care time)  Medications Ordered in UC Medications - No data to display  Initial Impression  / Assessment and Plan / UC Course  I have reviewed the triage vital signs and the nursing notes.  Pertinent labs & imaging results that were available during my care of the patient were reviewed by me and considered in my medical decision making (see chart for details).   86year old female presenting with [name] for concerns of cough and congestion over the past 2 days.  Janice respiratory rate is increased to 28 and oxygen decreased somewhat at 93 to 94%.  Other vital signs are stable and she is afebrile.  There are few scattered wheezes throughout Janice chest.  Chest x-ray obtained today which not show any acute abnormalities.  Discussed results with patient and [name].  Patient initially denied any chest pain when I first talked Janice, but said she did develop a little bit of mild pleuritic pain and pain when she coughs since being in the urgent care.  She still denies any breathing difficulty.  Based on patient's presentation and vital signs as well as Janice mild respiratory distress advised work-up in the emergency department at this time.  Explained that there is concern for possible pulmonary embolism given the fact that she is a cancer patient and recently had back surgery.  Also, the new onset of pleuritic pain is concerning.  Additionally, she does have a bruise above Janice right eyebrow and she and Janice [name] state that they have not noticed that.  Patient met may have had an unwitnessed fall.  She does have a normal neurological exam, but I believe she does need a work-up for potential fall since she has bruising on Janice forehead and does not recall how it happened.  Patient and [name] agreeable to decision to go to emergency department.  They deny EMS transport and Janice [name] plans to take Janice to [facility] at this time.  She does leave in stable condition.  Final Clinical Impressions(s) / UC Diagnoses   Final diagnoses:  Pleuritic pain  Cough  Shortness of breath  Contusion of face, initial encounter       Discharge Instructions     You have been advised to follow up immediately in the emergency department for concerning signs.symptoms. If you declined EMS transport, please have a family member take you directly to the ED at this time. Do not delay. Based on concerns about condition, if you do not follow up in th e ED, you may risk poor outcomes including worsening of condition, delayed treatment and potentially life threatening issues. If you have declined to go to the ED at this time, you should call your PCP immediately to set up a follow up appointment.  Go to ED for red flag symptoms, including; fevers you cannot reduce with Tylenol/Motrin, severe headaches, vision changes, numbness/weakness in part of the body, lethargy, confusion, intractable vomiting, severe dehydration, chest pain, breathing difficulty, severe persistent abdominal or pelvic pain, signs of severe infection (increased redness, swelling of an area), feeling faint or passing out, dizziness, etc. You should especially go to the ED for sudden acute worsening of condition if you do not elect to go at this time.     ED Prescriptions    None     PDMP not reviewed this encounter.   Danton Clap, PA-C 05/19/20 [phone]

## 2020-05-18 NOTE — H&P (Signed)
History and Physical    Janice [name] WYO:[id number] DOB: 1928-07-29 DOA: 05/18/2020  PCP: Ezequiel Kayser, MD  Patient coming from: Home via urgent care  I have personally briefly reviewed patient's old medical records in [place]  Chief Complaint: Cough  HPI: Janice [name] is a 86 y.o. 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  Patient and son state that since her recent surgery she has been generally weak and spending majority of her time in bed.  She normally uses a walker to ambulate but has been having difficulty with this which they suspect is due to deconditioning.  She says she has been falling fairly frequently with last fall a couple weeks ago.  She has not had any significant injury but has developed bruises on her anterior shins and above her right eyebrow.  About 3 days ago she began to develop a new nonproductive cough.  Cough has been fairly persistent and she has not noticed any exacerbating or relieving factors.  She says she has not really noticed any shortness of breath but also states she is not exerting herself.  She has not seen any swelling in her feet or legs.  She denies any subjective fevers, chills, diaphoresis, chest pain, palpitations, nausea, vomiting, abdominal pain, dysuria, diarrhea, or constipation.  She denies any recent changes in her medications.  She initially went to urgent care in [facility], however after their evaluation was sent to the ED for further imaging studies due to concern for possible PE.  Patient currently lives at home with 2 of her sons.  ED  Course:  Initial vitals showed BP 159/69, pulse 83, RR 20, temp 97.9 Fahrenheit, SPO2 94% on room air.  Per EDP, SPO2 dropped to 88% while on room air at rest.  Patient was placed on 2 L supplemental O2 via [place] with improved SPO2 98-100%.  Labs notable for sodium 136, potassium 3.9, bicarb 26, BUN 12, creatinine 0.86, serum glucose 124, LFTs within normal limits, WBC 4.8, hemoglobin 11.9, platelets 199,000, high-sensitivity troponin I 10 and 11.  BNP 531.3.  Fibrin derivatives D-dimer elevated at 1267.49.  SARS-CoV-2 PCR is obtained and pending.  2 view chest x-ray obtained in urgent care showed cardiomegaly without focal consolidation or effusion.  CTA chest PE study was negative for evidence of PE.  Cardiomegaly with pulmonary vascular congestion was seen.  Atherosclerotic disease of the coronary arteries and aorta noted.  Compression deformities post vertebroplasty of T9 and T12 vertebral bodies and small hiatal hernia also reported.  Patient was given IV Lasix 20 mg once and the hospitalist service was consulted to admit for further evaluation and management.  Review of Systems: All systems reviewed and are negative except as documented in history of present illness above.   Past Medical History:  Diagnosis Date  . Breast cancer ([place]) 2017   DCIS of right breast  . Colon cancer ([place]) 2003   adenocarcinoma of the ascending colon, 2007  . Environmental allergies   . High cholesterol   . Hypertension   . Large cell lymphoma ([place]) 2007   Diffuse large cell lymphoma, completed CHOP and Rituxan in  March 2004  . Non Hodgkin's lymphoma ([place]) 2003  . Personal history of chemotherapy 2003   Non Hodgkins  . Skin cancer     Past Surgical History:  Procedure Laterality Date  . BREAST BIOPSY Right 03/18/2016   DCIS  . BREAST BIOPSY Right 08/19/2017   ribbon marker, Fibrocystic changes- neg  . BREAST LUMPECTOMY Right 04/24/2016   DCIS clear margins. SN biopsy-negative. Declined rad tx.     Marland Kitchen BREAST LUMPECTOMY WITH NEEDLE LOCALIZATION Right 04/24/2016   Procedure: BREAST LUMPECTOMY WITH NEEDLE LOCALIZATION;  Surgeon: Jules Husbands, MD;  Location: [facility];  Service: General;  Laterality: Right;  . COLON SURGERY  11/24/05   Cancer surgery  . COLONOSCOPY     2012  . COLONOSCOPY N/A 08/28/2015   Procedure: COLONOSCOPY;  Surgeon: Hulen Luster, MD;  Location: [facility];  Service: Gastroenterology;  Laterality: N/A;  . DIAGNOSTIC MAMMOGRAM     May 2014  . KYPHOPLASTY N/A 03/28/2020   Procedure: T9 AND T12 KYPHOPLASTY;  Surgeon: Hessie Knows, MD;  Location: [facility];  Service: Orthopedics;  Laterality: N/A;  . LYMPH NODE DISSECTION  2003   neck - non-hodgkin's lymphoma  . SACROPLASTY N/A 01/04/2020   Procedure: SACROPLASTY;  Surgeon: Hessie Knows, MD;  Location: [facility];  Service: Orthopedics;  Laterality: N/A;  . SENTINEL NODE BIOPSY Right 04/24/2016   Procedure: SENTINEL NODE BIOPSY;  Surgeon: Jules Husbands, MD;  Location: [facility];  Service: General;  Laterality: Right;    Social History:  reports that she has never smoked. She has never used smokeless tobacco. She reports that she does not drink alcohol and does not use drugs.  No Known Allergies  Family History  Problem Relation Age of Onset  . Breast cancer Sister 31       half sister  . Cancer Other        family history of colon cancer, breast cancer, lung cancer  . Cancer Paternal Uncle        colon  . Colon cancer Maternal Uncle   . Stomach cancer Maternal Grandmother   . Heart attack Mother 3  . Hypertension Mother   . Other Father        "old age"     Prior to Admission medications   Medication Sig Start Date End Date Taking? Authorizing Provider  acetaminophen (TYLENOL) 500 MG tablet Take 500 mg by mouth every 6 (six) hours as needed.    [provider]  alendronate (FOSAMAX) 70 MG tablet Take 70 mg by mouth once a week.  01/06/19   [provider]  amLODipine (NORVASC) 10 MG  tablet Take 10 mg by mouth daily.  01/06/19   [provider]  aspirin 81 MG tablet Take 81 mg by mouth daily.    [provider]  atorvastatin (LIPITOR) 10 MG tablet Take 10 mg by mouth daily.    [provider]  HYDROcodone-acetaminophen (NORCO/VICODIN) 5-325 MG tablet Take 1 tablet by mouth every 6 (six) hours as needed for moderate pain. 03/28/20   Hessie Knows, MD  lisinopril (PRINIVIL,ZESTRIL) 20 MG tablet Take 20 mg by mouth daily.    [provider]  polyethylene glycol (MIRALAX / GLYCOLAX) 17 g packet Take 17 g by mouth daily as needed for mild constipation. Patient not taking: Reported on 03/22/2020 01/05/20   [name], MD  senna (SENOKOT) 8.6 MG TABS tablet Take 1 tablet (8.6 mg total) by mouth 2 (two) times daily. Patient not  taking: Reported on 03/22/2020 01/05/20   [name], MD  tamoxifen (NOLVADEX) 20 MG tablet Take 1 tablet (20 mg total) by mouth daily. Patient MUST make an appointment to be seen before another refill will be approved 04/22/20   Lequita Asal, MD  Vitamin D, Ergocalciferol, (DRISDOL) 1.25 MG (50000 UT) CAPS capsule Take 50,000 Units by mouth every 7 (seven) days.  01/06/19   [provider]    Physical Exam: Vitals:   05/18/20 2125 05/18/20 2138 05/18/20 2300 05/18/20 2330  BP:   134/77 (!) 133/54  Pulse: 87 87 93 79  Resp: (!) 22 (!) 21 20 17   Temp:      TempSrc:      SpO2: (!) 89% 92% 96% 90%  Weight:      Height:       Constitutional: Elderly woman resting in bed with head slightly elevated, NAD, calm, comfortable Eyes: PERRL, lids and conjunctivae normal ENMT: Mucous membranes are moist. Posterior pharynx clear of any exudate or lesions.Normal dentition.  Neck: normal, supple, no masses. Respiratory: Bibasilar inspiratory crackles.  Normal respiratory effort. No accessory muscle use.  Cardiovascular: Irregularly irregular, soft murmur present. No extremity edema. 2+ pedal pulses. Abdomen: no  tenderness, no masses palpated. No hepatosplenomegaly. Bowel sounds positive.  Musculoskeletal: no clubbing / cyanosis. No joint deformity upper and lower extremities. Good ROM, no contractures. Normal muscle tone.  Skin: Bruising above the right eyebrow and bilateral anterior shins.  No open wounds, no induration. Neurologic: CN 2-12 grossly intact. Sensation intact, Strength 5/5 in all 4.  Psychiatric:  Alert and oriented x 3 however has circumferential speech and exhibits some short-term memory loss.  Labs on Admission: I have personally reviewed following labs and imaging studies  CBC: Recent Labs  Lab 05/18/20 1536  WBC 4.8  NEUTROABS 3.4  HGB 11.9*  HCT 37.3  MCV 95.4  PLT 989   Basic Metabolic Panel: Recent Labs  Lab 05/18/20 1536  NA 136  K 3.9  CL 99  CO2 26  GLUCOSE 124*  BUN 12  CREATININE 0.86  CALCIUM 8.8*   GFR: Estimated Creatinine Clearance: 43.3 mL/min (by C-G formula based on SCr of 0.86 mg/dL). Liver Function Tests: Recent Labs  Lab 05/18/20 1536  AST 17  ALT 11  ALKPHOS 58  BILITOT 0.8  PROT 7.2  ALBUMIN 3.7   No results for input(s): LIPASE, AMYLASE in the last 168 hours. No results for input(s): AMMONIA in the last 168 hours. Coagulation Profile: No results for input(s): INR, PROTIME in the last 168 hours. Cardiac Enzymes: No results for input(s): CKTOTAL, CKMB, CKMBINDEX, TROPONINI in the last 168 hours. BNP (last 3 results) No results for input(s): PROBNP in the last 8760 hours. HbA1C: No results for input(s): HGBA1C in the last 72 hours. CBG: No results for input(s): GLUCAP in the last 168 hours. Lipid Profile: No results for input(s): CHOL, HDL, LDLCALC, TRIG, CHOLHDL, LDLDIRECT in the last 72 hours. Thyroid Function Tests: No results for input(s): TSH, T4TOTAL, FREET4, T3FREE, THYROIDAB in the last 72 hours. Anemia Panel: No results for input(s): VITAMINB12, FOLATE, FERRITIN, TIBC, IRON, RETICCTPCT in the last 72 hours. Urine  analysis:    Component Value Date/Time   COLORURINE YELLOW 05/18/2014 1053   APPEARANCEUR CLOUDY 05/18/2014 1053   LABSPEC 1.020 05/18/2014 1053   PHURINE 5.5 05/18/2014 [place] 05/18/2014 [place] 05/18/2014 [place] 05/18/2014 [place] 05/18/2014 1053  PROTEINUR 100 mg/dL 05/18/2014 1053   NITRITE NEGATIVE 05/18/2014 [place] 05/18/2014 1053    Radiological Exams on Admission: DG Chest 2 View  Result Date: 05/18/2020 CLINICAL DATA:  Cough.  Increased respiratory rate. EXAM: CHEST - 2 VIEW COMPARISON:  01/04/2020 FINDINGS: Heart is enlarged. There is dense mitral calcification. There is minimal subsegmental atelectasis at the lung bases. No focal consolidations or pleural effusions. Remote 2 level vertebroplasty. IMPRESSION: Cardiomegaly. Electronically Signed   By: Nolon Nations M.D.   On: 05/18/2020 14:45   CT Angio Chest PE W and/or Wo Contrast  Result Date: 05/18/2020 CLINICAL DATA:  Suspected pulmonary embolus. EXAM: CT ANGIOGRAPHY CHEST WITH CONTRAST TECHNIQUE: Multidetector CT imaging of the chest was performed using the standard protocol during bolus administration of intravenous contrast. Multiplanar CT image reconstructions and MIPs were obtained to evaluate the vascular anatomy. CONTRAST:  29mL OMNIPAQUE IOHEXOL 350 MG/ML SOLN COMPARISON:  January 04, 2020 FINDINGS: Cardiovascular: Satisfactory opacification of the pulmonary arteries to the segmental level. No evidence of pulmonary embolism. Enlarged heart. Calcific atherosclerotic disease of the coronary arteries and aorta. Mitral valve annular calcifications. Mediastinum/Nodes: No enlarged mediastinal, hilar, or axillary lymph nodes. Thyroid gland, trachea, and esophagus demonstrate no significant findings. Small hiatal hernia. Lungs/Pleura: Mild pulmonary vascular congestion. No focal airspace consolidation Upper Abdomen: No acute abnormality.  Musculoskeletal: Compression deformities post vertebroplasty of T9 and T12 vertebral bodies. Review of the MIP images confirms the above findings. IMPRESSION: 1. No evidence of pulmonary embolism. 2. Enlarged heart with mild pulmonary vascular congestion. 3. Calcific atherosclerotic disease of the coronary arteries and aorta. 4. No evidence of consolidation in the lungs. 5. Small hiatal hernia. 6. Compression deformities post vertebroplasty of T9 and T12 vertebral bodies. Aortic Atherosclerosis (ICD10-I70.0). Electronically Signed   By: Fidela Salisbury M.D.   On: 05/18/2020 21:20    EKG: Personally reviewed. Sinus rhythm with first-degree AV block, RBBB/LAFB, PVCs present.  PVC is new when compared to prior which showed PACs.  Assessment/Plan Principal Problem:   Acute respiratory failure with hypoxia (HCC) Active Problems:   Ductal carcinoma in situ (DCIS) of right breast   HTN (hypertension)   Hypercholesterolemia   Generalized weakness  Janice [name] is a 86 y.o. 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  Acute respiratory failure with hypoxia: Patient with new hypoxia with SPO2 88% on room air while at rest.  She does not require supplemental oxygen at home.  Suspect new onset CHF given elevated BNP and pulmonary vascular congestion seen on imaging.  Bibasilar crackles present on exam.  CTA is negative for PE.  No evidence of pneumonia.  Previous echo 01/05/2020 showed EF 60-65% but was otherwise technically difficult study and LV diastolic parameters were indeterminate. -Continue IV Lasix 40 mg twice daily -Monitor strict I/O's and daily weights -Update echocardiogram -Monitor electrolytes and  renal function -Continue supplemental oxygen as needed and wean as able  Generalized weakness/deconditioning with frequent falls: Recent sacral fracture s/p sacral plasty 01/04/2020 and s/p kyphoplasty T9 T12 on 03/28/2020.  Patient has been progressively deconditioned and largely bedridden since her surgery as per son.  She has been having falls at home despite the use of a walker. -PT/OT eval -Fall precautions -TOC consult for potential  SNF placement  Hypertension: Continue amlodipine and lisinopril.  Hyperlipidemia: Continue atorvastatin.  NHL s/p R-CHOP 2004 Colon cancer s/p resection 2007 Right breast DCIS s/p right lumpectomy: Currently following with oncology, Dr. Mike Gip.  Continue home tamoxifen.   DVT prophylaxis: Lovenox Code Status: DNR, confirmed with patient. Family Communication: Discussed with patient's son at bedside Disposition Plan: From home, may need SNF on discharge. Consults called: None Admission status:  Status is: Observation  The patient remains OBS appropriate and will d/c before 2 midnights.  Dispo: The patient is from: Home              Anticipated d/c is to: SNF              Anticipated d/c date is: 1 day              Patient currently is not medically stable to d/c.   Zada Finders MD [facility]  If 7PM-7AM, please contact night-coverage [email or web address]  05/19/2020, 12:10 AM

## 2020-05-18 NOTE — ED Triage Notes (Signed)
Patient in today c/o cough and chest congestion x 2 days. Patient denies fever. Patient has taken OTC cough syrup and Tylenol. Patient has had 2 back surgeries this past summer and is bed ridden most of the time. Patient is fully vaccinated against covid.

## 2020-05-18 NOTE — ED Notes (Signed)
[employer] [phone]

## 2020-05-18 NOTE — ED Provider Notes (Signed)
[facility] Emergency Department Provider Note   ____________________________________________   First MD Initiated Contact with Patient 05/18/20 1746     (approximate)  I have reviewed the triage vital signs and the nursing notes.   HISTORY  Chief Complaint Cough    HPI Janice [name] is a 86 y.o. female who comes from urgent care.  She has been coughing for 2 nights in urgent care doctor sent her here for evaluation for pulmonary embolus and CT scan.  While she was here her O2 sats gradually dropped into the high 80s.  This was while she is awake alert and talking.  CT scan was done did not show any pulmonary emboli but there was the appearance of congestive failure mild.  Patient denies any chest pain or tightness.  She has no fever.  Cough is not productive.  She did not feel short of breath.         Past Medical History:  Diagnosis Date  . Breast cancer ([place]) 2017   DCIS of right breast  . Colon cancer ([place]) 2003   adenocarcinoma of the ascending colon, 2007  . Environmental allergies   . High cholesterol   . Hypertension   . Large cell lymphoma ([place]) 2007   Diffuse large cell lymphoma, completed CHOP and Rituxan in March 2004  . Non Hodgkin's lymphoma ([place]) 2003  . Personal history of chemotherapy 2003   Non Hodgkins  . Skin cancer     Patient Active Problem List   Diagnosis Date Noted  . Acute respiratory failure with hypoxia ([place]) 05/18/2020  . Abnormal bone marrow examination 04/17/2020  . Hypoxia   . Closed sacral fracture ([place]) 01/02/2020  . Inadequate pain control   . Breast mass, right   . Ductal carcinoma in situ (DCIS) of right breast 03/26/2016  . History of non-Hodgkin's lymphoma 07/19/2015  . Bronchitis 11/23/2013  . History of colon cancer 11/23/2013  . HTN (hypertension) 11/23/2013  . Hypercholesterolemia 11/23/2013    Past Surgical History:  Procedure Laterality Date  . BREAST BIOPSY Right 03/18/2016   DCIS  .  BREAST BIOPSY Right 08/19/2017   ribbon marker, Fibrocystic changes- neg  . BREAST LUMPECTOMY Right 04/24/2016   DCIS clear margins. SN biopsy-negative. Declined rad tx.   Marland Kitchen BREAST LUMPECTOMY WITH NEEDLE LOCALIZATION Right 04/24/2016   Procedure: BREAST LUMPECTOMY WITH NEEDLE LOCALIZATION;  Surgeon: Jules Husbands, MD;  Location: [facility];  Service: General;  Laterality: Right;  . COLON SURGERY  11/24/05   Cancer surgery  . COLONOSCOPY     2012  . COLONOSCOPY N/A 08/28/2015   Procedure: COLONOSCOPY;  Surgeon: Hulen Luster, MD;  Location: [place];  Service: Gastroenterology;  Laterality: N/A;  . DIAGNOSTIC MAMMOGRAM     May 2014  . KYPHOPLASTY N/A 03/28/2020   Procedure: T9 AND T12 KYPHOPLASTY;  Surgeon: Hessie Knows, MD;  Location: [facility];  Service: Orthopedics;  Laterality: N/A;  . LYMPH NODE DISSECTION  2003   neck - non-hodgkin's lymphoma  . SACROPLASTY N/A 01/04/2020   Procedure: SACROPLASTY;  Surgeon: Hessie Knows, MD;  Location: [facility];  Service: Orthopedics;  Laterality: N/A;  . SENTINEL NODE BIOPSY Right 04/24/2016   Procedure: SENTINEL NODE BIOPSY;  Surgeon: Jules Husbands, MD;  Location: [facility];  Service: General;  Laterality: Right;    Prior to Admission medications   Medication Sig Start Date End Date Taking? Authorizing Provider  acetaminophen (TYLENOL) 500 MG tablet Take 500 mg by  mouth every 6 (six) hours as needed.   Yes [provider]  alendronate (FOSAMAX) 70 MG tablet Take 70 mg by mouth once a week.  01/06/19  Yes [provider]  amLODipine (NORVASC) 10 MG tablet Take 10 mg by mouth daily.  01/06/19  Yes [provider]  aspirin 81 MG tablet Take 81 mg by mouth daily.   Yes [provider]  atorvastatin (LIPITOR) 10 MG tablet Take 10 mg by mouth daily.   Yes [provider]  HYDROcodone-acetaminophen (NORCO/VICODIN) 5-325 MG tablet Take 1 tablet by mouth every 6 (six) hours as needed for moderate pain. 03/28/20  Yes  Hessie Knows, MD  lisinopril (PRINIVIL,ZESTRIL) 20 MG tablet Take 20 mg by mouth daily.   Yes [provider]  polyethylene glycol (MIRALAX / GLYCOLAX) 17 g packet Take 17 g by mouth daily as needed for mild constipation. 01/05/20  Yes [name], MD  tamoxifen (NOLVADEX) 20 MG tablet Take 1 tablet (20 mg total) by mouth daily. Patient MUST make an appointment to be seen before another refill will be approved 04/22/20  Yes Corcoran, Drue Second, MD  Vitamin D, Ergocalciferol, (DRISDOL) 1.25 MG (50000 UT) CAPS capsule Take 50,000 Units by mouth every 7 (seven) days.  01/06/19  Yes [provider]  senna (SENOKOT) 8.6 MG TABS tablet Take 1 tablet (8.6 mg total) by mouth 2 (two) times daily. Patient not taking: Reported on 03/22/2020 01/05/20   [name], MD    Allergies Patient has no known allergies.  Family History  Problem Relation Age of Onset  . Breast cancer Sister 59       half sister  . Cancer Other        family history of colon cancer, breast cancer, lung cancer  . Cancer Paternal Uncle        colon  . Colon cancer Maternal Uncle   . Stomach cancer Maternal Grandmother   . Heart attack Mother 80  . Hypertension Mother   . Other Father        "old age"    Social History Social History   Tobacco Use  . Smoking status: Never Smoker  . Smokeless tobacco: Never Used  Vaping Use  . Vaping Use: Never used  Substance Use Topics  . Alcohol use: No  . Drug use: No    Review of Systems  Constitutional: No fever/chills Eyes: No visual changes. ENT: No sore throat. Cardiovascular: Denies chest pain. Respiratory: Denies shortness of breath. Gastrointestinal: No abdominal pain.  No nausea, no vomiting.  No diarrhea.  No constipation. Genitourinary: Negative for dysuria. Musculoskeletal: Negative for back pain. Skin: Negative for rash. Neurological: Negative for headaches, focal weakness   ____________________________________________   PHYSICAL  EXAM:  VITAL SIGNS: ED Triage Vitals  Enc Vitals Group     BP 05/18/20 1531 (!) 159/69     Pulse Rate 05/18/20 1531 83     Resp 05/18/20 1531 20     Temp 05/18/20 1531 97.9 F (36.6 C)     Temp Source 05/18/20 1531 Oral     SpO2 05/18/20 1531 94 %     Weight 05/18/20 1532 160 lb (72.6 kg)     Height 05/18/20 1532 5\' 4"  (1.626 m)     Head Circumference --      Peak Flow --      Pain Score 05/18/20 1532 0     Pain Loc --      Pain Edu? --  Excl. in [place]? --    Constitutional: Alert and oriented. Well appearing and in no acute distress. Eyes: Conjunctivae are normal. PER EOMI. Head: Atraumatic. Nose: No congestion/rhinnorhea. Mouth/Throat: Mucous membranes are moist.  Oropharynx non-erythematous. Neck: No stridor.   Cardiovascular: Normal rate, regular rhythm. Grossly normal heart sounds.  Good peripheral circulation. Respiratory: Normal respiratory effort.  No retractions. Lungs CTAB. Gastrointestinal: Soft and nontender. No distention. No abdominal bruits. No CVA tenderness. Musculoskeletal: No lower extremity tenderness nor edema.  No joint effusions. Neurologic:  Normal speech and language. No gross focal neurologic deficits are appreciated. No gait instability. Skin:  Skin is warm, dry and intact. No rash noted. Psychiatric: Mood and affect are normal. Speech and behavior are normal.  ____________________________________________   LABS (all labs ordered are listed, but only abnormal results are displayed)  Labs Reviewed  COMPREHENSIVE METABOLIC PANEL - Abnormal; Notable for the following components:      Result Value   Glucose, Bld 124 (*)    Calcium 8.8 (*)    All other components within normal limits  CBC WITH DIFFERENTIAL/PLATELET - Abnormal; Notable for the following components:   Hemoglobin 11.9 (*)    Lymphs Abs 0.6 (*)    All other components within normal limits  FIBRIN DERIVATIVES D-DIMER ([facility] ONLY) - Abnormal; Notable for the following components:    Fibrin derivatives D-dimer ([facility]) 1,267.49 (*)    All other components within normal limits  BRAIN NATRIURETIC PEPTIDE - Abnormal; Notable for the following components:   B Natriuretic Peptide 531.3 (*)    All other components within normal limits  TROPONIN I (HIGH SENSITIVITY)  TROPONIN I (HIGH SENSITIVITY)  TROPONIN I (HIGH SENSITIVITY)   ____________________________________________  EKG EKG read interpreted by me shows normal sinus rhythm 89 left axis right bundle branch decreased R wave progression no acute changes EKG #2 shows atrial flutter at a rate of 87 left axis right bundle branch poor R wave progression.  Patient has a history of brief intermittent episodes of irregular heartbeat including atrial tachycardia.  ____________________________________________  RADIOLOGY Gertha Calkin, personally viewed and evaluated these images (plain radiographs) as part of my medical decision making, as well as reviewing the written report by the radiologist.  ED MD interpretation: Chest x-ray read by radiology reviewed by me does not show any acute findings except for some apparent atelectasis I reviewed the film of the CT as well which shows some enlarged heart and CHF.  Official radiology report(s): DG Chest 2 View  Result Date: 05/18/2020 CLINICAL DATA:  Cough.  Increased respiratory rate. EXAM: CHEST - 2 VIEW COMPARISON:  01/04/2020 FINDINGS: Heart is enlarged. There is dense mitral calcification. There is minimal subsegmental atelectasis at the lung bases. No focal consolidations or pleural effusions. Remote 2 level vertebroplasty. IMPRESSION: Cardiomegaly. Electronically Signed   By: Nolon Nations M.D.   On: 05/18/2020 14:45   CT Angio Chest PE W and/or Wo Contrast  Result Date: 05/18/2020 CLINICAL DATA:  Suspected pulmonary embolus. EXAM: CT ANGIOGRAPHY CHEST WITH CONTRAST TECHNIQUE: Multidetector CT imaging of the chest was performed using the standard protocol during bolus  administration of intravenous contrast. Multiplanar CT image reconstructions and MIPs were obtained to evaluate the vascular anatomy. CONTRAST:  63mL OMNIPAQUE IOHEXOL 350 MG/ML SOLN COMPARISON:  January 04, 2020 FINDINGS: Cardiovascular: Satisfactory opacification of the pulmonary arteries to the segmental level. No evidence of pulmonary embolism. Enlarged heart. Calcific atherosclerotic disease of the coronary arteries and aorta. Mitral valve annular calcifications. Mediastinum/Nodes: No  enlarged mediastinal, hilar, or axillary lymph nodes. Thyroid gland, trachea, and esophagus demonstrate no significant findings. Small hiatal hernia. Lungs/Pleura: Mild pulmonary vascular congestion. No focal airspace consolidation Upper Abdomen: No acute abnormality. Musculoskeletal: Compression deformities post vertebroplasty of T9 and T12 vertebral bodies. Review of the MIP images confirms the above findings. IMPRESSION: 1. No evidence of pulmonary embolism. 2. Enlarged heart with mild pulmonary vascular congestion. 3. Calcific atherosclerotic disease of the coronary arteries and aorta. 4. No evidence of consolidation in the lungs. 5. Small hiatal hernia. 6. Compression deformities post vertebroplasty of T9 and T12 vertebral bodies. Aortic Atherosclerosis (ICD10-I70.0). Electronically Signed   By: Fidela Salisbury M.D.   On: 05/18/2020 21:20    ____________________________________________   PROCEDURES  Procedure(s) performed (including Critical Care):  Procedures   ____________________________________________   INITIAL IMPRESSION / ASSESSMENT AND PLAN / ED COURSE  Patient CT shows some enlarged heart and CHF but no pulmonary embolus or pneumonia.  Patient's O2 sats dropped into the upper 80s while she is awake and alert.  She responds to 2 L of nasal cannula.  I have given her some Lasix.  Because she had a essentially normal cardiac echo in August I feel that the apparent new onset of CHF together with the  hypoxia warrant admission for this lady.             ____________________________________________   FINAL CLINICAL IMPRESSION(S) / ED DIAGNOSES  Final diagnoses:  Cough  Hypoxia  Congestive heart failure, unspecified HF chronicity, unspecified heart failure type [facility])     ED Discharge Orders    None      *Please note:  Arnie Maiolo Jon was evaluated in Emergency Department on 05/18/2020 for the symptoms described in the history of present illness. She was evaluated in the context of the global COVID-19 pandemic, which necessitated consideration that the patient might be at risk for infection with the SARS-CoV-2 virus that causes COVID-19. Institutional protocols and algorithms that pertain to the evaluation of patients at risk for COVID-19 are in a state of rapid change based on information released by regulatory bodies including the CDC and federal and state organizations. These policies and algorithms were followed during the patient's care in the ED.  Some ED evaluations and interventions may be delayed as a result of limited staffing during and the pandemic.*   Note:  This document was prepared using Dragon voice recognition software and may include unintentional dictation errors.    Nena Polio, MD 05/18/20 2358

## 2020-05-18 NOTE — Discharge Instructions (Signed)

## 2020-05-19 ENCOUNTER — Observation Stay
Admission: EM | Admit: 2020-05-19 | Discharge: 2020-05-19 | Disposition: A | Discharge status: Home / Self Care | Payer: Medicare Other | Source: Home / Self Care | Attending: Internal Medicine | Admitting: Internal Medicine

## 2020-05-19 DIAGNOSIS — Z79899 Other long term (current) drug therapy: Secondary | ICD-10-CM | POA: Diagnosis not present

## 2020-05-19 DIAGNOSIS — Z85828 Personal history of other malignant neoplasm of skin: Secondary | ICD-10-CM | POA: Diagnosis not present

## 2020-05-19 DIAGNOSIS — Z7982 Long term (current) use of aspirin: Secondary | ICD-10-CM | POA: Diagnosis not present

## 2020-05-19 DIAGNOSIS — Z20822 Contact with and (suspected) exposure to covid-19: Secondary | ICD-10-CM | POA: Diagnosis present

## 2020-05-19 DIAGNOSIS — Z7189 Other specified counseling: Secondary | ICD-10-CM | POA: Diagnosis not present

## 2020-05-19 DIAGNOSIS — R296 Repeated falls: Secondary | ICD-10-CM | POA: Diagnosis present

## 2020-05-19 DIAGNOSIS — I10 Essential (primary) hypertension: Secondary | ICD-10-CM

## 2020-05-19 DIAGNOSIS — Z800 Family history of malignant neoplasm of digestive organs: Secondary | ICD-10-CM | POA: Diagnosis not present

## 2020-05-19 DIAGNOSIS — Z9221 Personal history of antineoplastic chemotherapy: Secondary | ICD-10-CM | POA: Diagnosis not present

## 2020-05-19 DIAGNOSIS — E876 Hypokalemia: Secondary | ICD-10-CM | POA: Diagnosis present

## 2020-05-19 DIAGNOSIS — R627 Adult failure to thrive: Secondary | ICD-10-CM | POA: Diagnosis present

## 2020-05-19 DIAGNOSIS — I509 Heart failure, unspecified: Secondary | ICD-10-CM | POA: Diagnosis not present

## 2020-05-19 DIAGNOSIS — Z803 Family history of malignant neoplasm of breast: Secondary | ICD-10-CM | POA: Diagnosis not present

## 2020-05-19 DIAGNOSIS — E785 Hyperlipidemia, unspecified: Secondary | ICD-10-CM | POA: Diagnosis present

## 2020-05-19 DIAGNOSIS — I5033 Acute on chronic diastolic (congestive) heart failure: Secondary | ICD-10-CM | POA: Diagnosis present

## 2020-05-19 DIAGNOSIS — Z8571 Personal history of Hodgkin lymphoma: Secondary | ICD-10-CM | POA: Diagnosis not present

## 2020-05-19 DIAGNOSIS — I110 Hypertensive heart disease with heart failure: Secondary | ICD-10-CM | POA: Diagnosis present

## 2020-05-19 DIAGNOSIS — Z66 Do not resuscitate: Secondary | ICD-10-CM | POA: Diagnosis present

## 2020-05-19 DIAGNOSIS — R531 Weakness: Secondary | ICD-10-CM | POA: Diagnosis not present

## 2020-05-19 DIAGNOSIS — Z85038 Personal history of other malignant neoplasm of large intestine: Secondary | ICD-10-CM | POA: Diagnosis not present

## 2020-05-19 DIAGNOSIS — Z853 Personal history of malignant neoplasm of breast: Secondary | ICD-10-CM | POA: Diagnosis not present

## 2020-05-19 DIAGNOSIS — Z8249 Family history of ischemic heart disease and other diseases of the circulatory system: Secondary | ICD-10-CM | POA: Diagnosis not present

## 2020-05-19 DIAGNOSIS — R059 Cough, unspecified: Secondary | ICD-10-CM | POA: Diagnosis present

## 2020-05-19 DIAGNOSIS — Z801 Family history of malignant neoplasm of trachea, bronchus and lung: Secondary | ICD-10-CM | POA: Diagnosis not present

## 2020-05-19 DIAGNOSIS — E7800 Pure hypercholesterolemia, unspecified: Secondary | ICD-10-CM | POA: Diagnosis present

## 2020-05-19 DIAGNOSIS — I480 Paroxysmal atrial fibrillation: Secondary | ICD-10-CM | POA: Diagnosis present

## 2020-05-19 DIAGNOSIS — J9601 Acute respiratory failure with hypoxia: Secondary | ICD-10-CM | POA: Diagnosis not present

## 2020-05-19 DIAGNOSIS — R410 Disorientation, unspecified: Secondary | ICD-10-CM | POA: Diagnosis not present

## 2020-05-19 DIAGNOSIS — Z515 Encounter for palliative care: Secondary | ICD-10-CM | POA: Diagnosis not present

## 2020-05-19 DIAGNOSIS — E871 Hypo-osmolality and hyponatremia: Secondary | ICD-10-CM | POA: Diagnosis present

## 2020-05-19 LAB — CBC
HCT: 33.5 % — ABNORMAL LOW (ref 36.0–46.0)
Hemoglobin: 10.9 g/dL — ABNORMAL LOW (ref 12.0–15.0)
MCH: 30.9 pg (ref 26.0–34.0)
MCHC: 32.5 g/dL (ref 30.0–36.0)
MCV: 94.9 fL (ref 80.0–100.0)
Platelets: 202 10*3/uL (ref 150–400)
RBC: 3.53 MIL/uL — ABNORMAL LOW (ref 3.87–5.11)
RDW: 14.6 % (ref 11.5–15.5)
WBC: 3.7 10*3/uL — ABNORMAL LOW (ref 4.0–10.5)
nRBC: 0 % (ref 0.0–0.2)

## 2020-05-19 LAB — BASIC METABOLIC PANEL
Anion gap: 10 (ref 5–15)
BUN: 12 mg/dL (ref 8–23)
CO2: 27 mmol/L (ref 22–32)
Calcium: 8.5 mg/dL — ABNORMAL LOW (ref 8.9–10.3)
Chloride: 96 mmol/L — ABNORMAL LOW (ref 98–111)
Creatinine, Ser: 0.77 mg/dL (ref 0.44–1.00)
GFR, Estimated: >60 mL/min (ref >60)
Glucose, Bld: 102 mg/dL — ABNORMAL HIGH (ref 70–99)
Potassium: 3.4 mmol/L — ABNORMAL LOW (ref 3.5–5.1)
Sodium: 133 mmol/L — ABNORMAL LOW (ref 135–145)

## 2020-05-19 LAB — SARS CORONAVIRUS 2 (TAT 6-24 HRS): SARS Coronavirus 2: NEGATIVE

## 2020-05-19 LAB — TROPONIN I (HIGH SENSITIVITY): Troponin I (High Sensitivity): 12 ng/L (ref <18)

## 2020-05-19 LAB — MAGNESIUM: Magnesium: 2.1 mg/dL (ref 1.7–2.4)

## 2020-05-19 MED ORDER — POTASSIUM CHLORIDE CRYS ER 20 MEQ PO TBCR
40.0000 meq | EXTENDED_RELEASE_TABLET | Freq: Once | ORAL | Status: AC
  Administered 2020-05-19: 40 meq via ORAL
  Filled 2020-05-19: qty 2

## 2020-05-19 MED ORDER — METHYLPREDNISOLONE SODIUM SUCC 40 MG IJ SOLR
40.0000 mg | Freq: Two times a day (BID) | INTRAMUSCULAR | Status: DC
  Administered 2020-05-19 – 2020-05-20 (×3): 40 mg via INTRAVENOUS
  Filled 2020-05-19 (×3): qty 1

## 2020-05-19 MED ORDER — ALPRAZOLAM 0.25 MG PO TABS
0.2500 mg | ORAL_TABLET | Freq: Two times a day (BID) | ORAL | Status: DC | PRN
  Administered 2020-05-19 – 2020-05-20 (×2): 0.25 mg via ORAL
  Filled 2020-05-19 (×2): qty 1

## 2020-05-19 MED ORDER — TAMOXIFEN CITRATE 10 MG PO TABS
20.0000 mg | ORAL_TABLET | Freq: Every day | ORAL | Status: DC
  Administered 2020-05-19 – 2020-05-21 (×3): 20 mg via ORAL
  Filled 2020-05-19 (×3): qty 2

## 2020-05-19 MED ORDER — AMLODIPINE BESYLATE 10 MG PO TABS
10.0000 mg | ORAL_TABLET | Freq: Every day | ORAL | Status: DC
  Administered 2020-05-19 – 2020-05-21 (×3): 10 mg via ORAL
  Filled 2020-05-19: qty 1
  Filled 2020-05-19: qty 2
  Filled 2020-05-19: qty 1

## 2020-05-19 MED ORDER — ACETAMINOPHEN 325 MG PO TABS
650.0000 mg | ORAL_TABLET | Freq: Four times a day (QID) | ORAL | Status: DC | PRN
  Administered 2020-05-20: 650 mg via ORAL
  Filled 2020-05-19: qty 2

## 2020-05-19 MED ORDER — INFLUENZA VAC A&B SA ADJ QUAD 0.5 ML IM PRSY: 0.5000 mL | PREFILLED_SYRINGE | INTRAMUSCULAR | Status: DC

## 2020-05-19 MED ORDER — ASPIRIN EC 81 MG PO TBEC
81.0000 mg | DELAYED_RELEASE_TABLET | Freq: Every day | ORAL | Status: DC
  Administered 2020-05-19 – 2020-05-21 (×3): 81 mg via ORAL
  Filled 2020-05-19 (×3): qty 1

## 2020-05-19 MED ORDER — IPRATROPIUM-ALBUTEROL 0.5-2.5 (3) MG/3ML IN SOLN
3.0000 mL | Freq: Four times a day (QID) | RESPIRATORY_TRACT | Status: DC
  Administered 2020-05-19 – 2020-05-20 (×3): 3 mL via RESPIRATORY_TRACT
  Filled 2020-05-19 (×3): qty 3

## 2020-05-19 MED ORDER — LISINOPRIL 20 MG PO TABS
20.0000 mg | ORAL_TABLET | Freq: Every day | ORAL | Status: DC
  Administered 2020-05-19 – 2020-05-21 (×3): 20 mg via ORAL
  Filled 2020-05-19: qty 1
  Filled 2020-05-19: qty 2
  Filled 2020-05-19: qty 1

## 2020-05-19 MED ORDER — INFLUENZA VAC A&B SA ADJ QUAD 0.5 ML IM PRSY
0.5000 mL | PREFILLED_SYRINGE | INTRAMUSCULAR | Status: DC
  Filled 2020-05-19: qty 0.5

## 2020-05-19 MED ORDER — ACETAMINOPHEN 650 MG RE SUPP: 650.0000 mg | Freq: Four times a day (QID) | RECTAL | Status: DC | PRN

## 2020-05-19 MED ORDER — ATORVASTATIN CALCIUM 20 MG PO TABS
10.0000 mg | ORAL_TABLET | Freq: Every day | ORAL | Status: DC
  Administered 2020-05-19 – 2020-05-20 (×2): 10 mg via ORAL
  Filled 2020-05-19 (×2): qty 1

## 2020-05-19 MED ORDER — ENOXAPARIN SODIUM 40 MG/0.4ML ~~LOC~~ SOLN
40.0000 mg | SUBCUTANEOUS | Status: DC
  Administered 2020-05-19 – 2020-05-20 (×2): 40 mg via SUBCUTANEOUS
  Filled 2020-05-19 (×2): qty 0.4

## 2020-05-19 MED ORDER — FUROSEMIDE 10 MG/ML IJ SOLN
40.0000 mg | Freq: Two times a day (BID) | INTRAMUSCULAR | Status: DC
  Administered 2020-05-19 – 2020-05-21 (×5): 40 mg via INTRAVENOUS
  Filled 2020-05-19 (×5): qty 4

## 2020-05-19 MED ORDER — POLYETHYLENE GLYCOL 3350 17 G PO PACK: 17.0000 g | PACK | Freq: Every day | ORAL | Status: DC | PRN

## 2020-05-19 NOTE — Progress Notes (Signed)
Pt confused and disorientated

## 2020-05-19 NOTE — ED Notes (Signed)
This RN called lab to ask about status of Covid swab that had been previously done. Lab informed this RN that the test had been ordered and sent to [facility] d/t the order that had been placed.

## 2020-05-19 NOTE — Evaluation (Signed)
Physical Therapy Evaluation Patient Details Name: Janice [name] MRN: [id number] DOB: 01-19-1929 Today's Date: 05/19/2020   History of Present Illness  Pt is an 86 y.o. 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  MD assessment includes: Acute diastolic CHF/acute respiratory failure with hypoxia, A-fib, Hyponatremia/hypokalemia, HTN, and normocytic anemia.    Clinical Impression  Pt cleared to participate with PT services by nursing prior to session. Pt was pleasant and motivated to participate during the session but overall was quite limited functionally during the session.  Pt required physical assistance with all functional tasks and presented with significant deficits in strength and stability.  Pt was unsteady during ambulation requiring min A to prevent a LOB and was only able to take several small, effortful steps at the EOB before requiring to return to sitting.  Pt is at a very high risk for falling and would not be safe to return to her prior living situation at this time. Pt will benefit from PT services in a SNF setting upon discharge to safely address deficits listed in patient problem list for decreased caregiver assistance and eventual return to PLOF.     Follow Up Recommendations SNF    Equipment Recommendations  Other (comment) (TBD at next venue of care)    Recommendations for Other Services       Precautions / Restrictions Precautions Precautions: Fall Restrictions Weight Bearing Restrictions: No      Mobility  Bed Mobility Overal bed mobility: Needs Assistance Bed Mobility: Supine to Sit;Sit to Supine     Supine to sit: Min assist Sit to supine: Min assist   General bed  mobility comments: Min A for trunk and BLE control    Transfers Overall transfer level: Needs assistance Equipment used: Rolling walker (2 wheeled) Transfers: Sit to/from Stand Sit to Stand: Min assist;From elevated surface         General transfer comment: Min A for stability and to come to full upright position from an elevated EOB  Ambulation/Gait Ambulation/Gait assistance: Min assist Gait Distance (Feet): 3 Feet Assistive device: Rolling walker (2 wheeled) Gait Pattern/deviations: Decreased step length - right;Decreased step length - left;Trunk flexed;Shuffle Gait velocity: decreased   General Gait Details: Min A for stability with pt only able to take several small, shuffling steps at the [facility]  Stairs            Wheelchair Mobility    Modified Rankin (Stroke Patients Only)       Balance Overall balance assessment: Needs assistance   Sitting balance-Leahy Scale: Fair     Standing balance support: Bilateral upper extremity supported;During functional activity Standing balance-Leahy Scale: Poor Standing balance comment: Min A for stability in standing                             Pertinent Vitals/Pain Pain Assessment: No/denies pain    Home Living Family/patient expects to be discharged to:: Private residence Living Arrangements: Children Available Help at Discharge: Available PRN/intermittently Type of Home: House Home Access: Stairs to enter Entrance Stairs-Rails: Right Entrance Stairs-Number of Steps: 6 Home Layout: One level Home Equipment: Bedside commode;Cane - single point;Walker - 4 wheels      Prior Function  Level of Independence: Needs assistance   Gait / Transfers Assistance Needed: Mod Ind amb in the house with a rollator, w/c for MD appointments, sons assist pt up/down stairs and with car transfers  ADL's / Homemaking Assistance Needed: Some assistance required with ADLs recently        Hand Dominance         Extremity/Trunk Assessment   Upper Extremity Assessment Upper Extremity Assessment: Generalized weakness    Lower Extremity Assessment Lower Extremity Assessment: Generalized weakness       Communication   Communication: No difficulties  Cognition Arousal/Alertness: Awake/alert Behavior During Therapy: WFL for tasks assessed/performed Overall Cognitive Status: Within Functional Limits for tasks assessed                                        General Comments      Exercises Total Joint Exercises Ankle Circles/Pumps: Strengthening;Both;10 reps Quad Sets: Strengthening;Both;10 reps Gluteal Sets: Strengthening;Both;10 reps Heel Slides: AAROM;Both;5 reps Hip ABduction/ADduction: AAROM;Both;10 reps Straight Leg Raises: AAROM;Both;10 reps Long Arc Quad: Strengthening;Both;10 reps Other Exercises Other Exercises: HEP education for BLE APs, QS, and GS x 10 each every 1-2 hours daily   Assessment/Plan    PT Assessment Patient needs continued PT services  PT Problem List Decreased strength;Decreased activity tolerance;Decreased balance;Decreased mobility       PT Treatment Interventions DME instruction;Gait training;Stair training;Functional mobility training;Therapeutic activities;Therapeutic exercise;Balance training;Patient/family education    PT Goals (Current goals can be found in the Care Plan section)  Acute Rehab PT Goals Patient Stated Goal: To get stronger PT Goal Formulation: With patient Time For Goal Achievement: 05/31/20 Potential to Achieve Goals: Fair    Frequency Min 2X/week   Barriers to discharge Inaccessible home environment;Decreased caregiver support      Co-evaluation               AM-PAC PT "6 Clicks" Mobility  Outcome Measure Help needed turning from your back to your side while in a flat bed without using bedrails?: A Little Help needed moving from lying on your back to sitting on the side of a flat bed without using  bedrails?: A Little Help needed moving to and from a bed to a chair (including a wheelchair)?: A Little Help needed standing up from a chair using your arms (e.g., wheelchair or bedside chair)?: A Little Help needed to walk in hospital room?: A Lot Help needed climbing 3-5 steps with a railing? : Total 6 Click Score: 15    End of Session Equipment Utilized During Treatment: Gait belt Activity Tolerance: Patient tolerated treatment well Patient left: in bed;with call bell/phone within reach;Other (comment) (Bilateral rails up in ED as pt was found) Nurse Communication: Mobility status PT Visit Diagnosis: Unsteadiness on feet (R26.81);Muscle weakness (generalized) (M62.81);Difficulty in walking, not elsewhere classified (R26.2)    Time: 1191-4782 PT Time Calculation (min) (ACUTE ONLY): 32 min   Charges:   PT Evaluation $PT Eval Moderate Complexity: 1 Mod PT Treatments $Therapeutic Exercise: 8-22 mins        D. Scott Latoy Labriola PT, DPT 05/19/20, 1:15 PM

## 2020-05-19 NOTE — Progress Notes (Signed)
*  PRELIMINARY RESULTS* Echocardiogram 2D Echocardiogram has been performed.  Janice [name] 05/19/2020, 12:18 PM

## 2020-05-19 NOTE — ED Notes (Signed)
Pt visualized resting in bed. Husband bedside. Pt asked of covid swab results. Will call lab about results that were not yet resulted.  Call bell within reach. No other needs expressed at this time.

## 2020-05-19 NOTE — Progress Notes (Signed)
[facility] PROGRESS NOTE   Janice [name] AST:[id number] DOB: 1928/08/20 DOA: 05/18/2020  PCP: Ezequiel Kayser, MD  Brief History/Interval Summary: 86 y.o. 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  Most of the history was obtained from the patient's son.  Apparently after recent back procedures she had been generally weak and has spent majority of time in her bed.  Evaluation in the emergency department revealed that she was hypoxic.  She was placed on 2 L of oxygen.  Imaging studies suggested vascular congestion in the lungs.  EKG also raise suspicion for new onset atrial fibrillation versus atrial flutter.  No infectious process was noted.  No PE was noted on CT scan.  Patient was hospitalized for further management.   Reason for Visit: Acute congestive heart failure possibly diastolic.  Consultants: Cardiologist from [place] clinic: Dr. Clayborn Bigness  Procedures: None yet  Antibiotics: Anti-infectives (From admission, onward)   None      Subjective/Interval History: Patient seems to be mildly distracted.  Her son is at the bedside.  She denies any chest pain.  Has had some difficulty breathing.  Some symptoms suggestive of orthopnea is present.  No nausea vomiting.  No abdominal pain.  Has had a cough with clear expectoration.    Assessment/Plan:  Acute diastolic CHF/acute respiratory failure with hypoxia Her hypoxia appears to be most likely due to congestive heart failure.  No definite pulmonary edema noted on imaging studies however she was found to have vascular congestion.  Noted to have lower extremity edema as well.  No other reason for her respiratory failure noted.  PE was ruled  out as well as pneumonia.  Previous echocardiogram from June 2021 showed normal systolic function.  Repeat echocardiogram has been ordered.  Patient started on diuretics.  Continue to monitor strict ins and outs and daily weights.  Patient previously seen by cardiology with [facility] clinic.    Abnormal cardiac rhythm Apparently she had a Holter monitor due to irregular heartbeat.  Apparently no conclusive evidence for atrial fibrillation was found.  Current EKG and telemetry does show irregular rhythm suspicious for either atrial fibrillation or atrial flutter.  I have consulted cardiology.  Hyponatremia and hypokalemia Replete potassium.  Monitor sodium levels while she is getting diuresed.  Magnesium was 2.1.  Generalized weakness/physical deconditioning with frequent falls She recently underwent kyphoplasty T9 T12 as well as sacral plasty in June.  She has been progressively getting weaker per her son.  PT and OT evaluation.  Might need to go to skilled nursing facility for short-term rehab.  Essential hypertension Monitor blood pressures closely.  Noted to be on amlodipine and lisinopril.  History of non-Hodgkin's lymphoma status post R-CHOP in 2004/history of colon cancer status post resection in 2007/history of right breast DCIS status post right lumpectomy Follows with oncology, Dr. Mike Gip.  Continue with home medications including tamoxifen.  Hyperlipidemia Continue atorvastatin  Normocytic anemia No evidence of overt bleeding.    DVT Prophylaxis: Lovenox Code Status: DNR Family Communication: Discussed with the patient's son Disposition Plan: To be determined.  PT and OT evaluation.  Status is: Observation  The patient will require care spanning > 2 midnights and should be moved to inpatient because: Ongoing diagnostic testing needed not  appropriate for outpatient work up, IV treatments appropriate due to intensity of illness or inability to take PO and Inpatient level of  care appropriate due to severity of illness  Dispo: The patient is from: Home              Anticipated d/c is to: SNF              Anticipated d/c date is: 2 days              Patient currently is not medically stable to d/c.    Medications:  Scheduled: . amLODipine  10 mg Oral Daily  . atorvastatin  10 mg Oral q1800  . enoxaparin (LOVENOX) injection  40 mg Subcutaneous Q24H  . furosemide  40 mg Intravenous Q12H  . lisinopril  20 mg Oral Daily  . tamoxifen  20 mg Oral Daily   Continuous:  EXH:[id number] **OR** acetaminophen   Objective:  Vital Signs  Vitals:   05/19/20 0400 05/19/20 0500 05/19/20 0600 05/19/20 0820  BP: 131/65 (!) 142/68 (!) 146/67 131/82  Pulse: 79 81 86 85  Resp:   18 18  Temp:    97.9 F (36.6 C)  TempSrc:    Oral  SpO2: 90% 94% 97% 95%  Weight:      Height:        Intake/Output Summary (Last 24 hours) at 05/19/2020 0922 Last data filed at 05/19/2020 0716 Gross per 24 hour  Intake --  Output 1000 ml  Net -1000 ml   Filed Weights   05/18/20 1532  Weight: 72.6 kg    General appearance: Awake alert.  In no distress.  Mildly distracted Resp: Mildly tachypneic at rest.  No use of accessory muscles.  Crackles bilateral bases.  No wheezing or rhonchi. Cardio: S1-S2 is noted to be irregular.  No S3-S4.  No rubs murmurs or bruit GI: Abdomen is soft.  Nontender nondistended.  Bowel sounds are present normal.  No masses organomegaly Extremities: Mild edema.  Able to move her legs.  Noted to be deconditioned. Neurologic: .  No focal neurological deficits.    Lab Results:  Data Reviewed: I have personally reviewed following labs and imaging studies  CBC: Recent Labs  Lab 05/18/20 1536 05/19/20 0640  WBC 4.8 3.7*  NEUTROABS 3.4  --   HGB 11.9* 10.9*  HCT 37.3 33.5*  MCV 95.4 94.9  PLT 199 175    Basic Metabolic Panel: Recent Labs  Lab 05/18/20 1536 05/19/20 0640  NA 136 133*  K 3.9 3.4*  CL 99 96*  CO2 26 27  GLUCOSE 124*  102*  BUN 12 12  CREATININE 0.86 0.77  CALCIUM 8.8* 8.5*  MG  --  2.1    GFR: Estimated Creatinine Clearance: 46.6 mL/min (by C-G formula based on SCr of 0.77 mg/dL).  Liver Function Tests: Recent Labs  Lab 05/18/20 1536  AST 17  ALT 11  ALKPHOS 58  BILITOT 0.8  PROT 7.2  ALBUMIN 3.7     Recent Results (from the past 240 hour(s))  SARS CORONAVIRUS 2 (TAT 6-24 HRS) Nasopharyngeal Nasopharyngeal Swab     Status: None   Collection Time: 05/18/20  2:19 PM   Specimen: Nasopharyngeal Swab  Result Value Ref Range Status   SARS Coronavirus 2 NEGATIVE NEGATIVE Final    Comment: (NOTE) SARS-CoV-2 target nucleic acids are NOT DETECTED.  The SARS-CoV-2 RNA is generally detectable in upper and lower respiratory specimens during the acute phase of infection. Negative results do  not preclude SARS-CoV-2 infection, do not rule out co-infections with other pathogens, and should not be used as the sole basis for treatment or other patient management decisions. Negative results must be combined with clinical observations, patient history, and epidemiological information. The expected result is Negative.  Fact Sheet for Patients: SugarRoll.be  Fact Sheet for Healthcare Providers: https://www.woods-mathews.com/  This test is not yet approved or cleared by the [place] FDA and  has been authorized for detection and/or diagnosis of SARS-CoV-2 by FDA under an Emergency Use Authorization (EUA). This EUA will remain  in effect (meaning this test can be used) for the duration of the COVID-19 declaration under Se ction 564(b)(1) of the Act, 21 U.S.C. section 360bbb-3(b)(1), unless the authorization is terminated or revoked sooner.  Performed at [facility], [place] [address], [place], [place] [address]       Radiology Studies: DG Chest 2 View  Result Date: 05/18/2020 CLINICAL DATA:  Cough.  Increased respiratory rate. EXAM: CHEST -  2 VIEW COMPARISON:  01/04/2020 FINDINGS: Heart is enlarged. There is dense mitral calcification. There is minimal subsegmental atelectasis at the lung bases. No focal consolidations or pleural effusions. Remote 2 level vertebroplasty. IMPRESSION: Cardiomegaly. Electronically Signed   By: Nolon Nations M.D.   On: 05/18/2020 14:45   CT Angio Chest PE W and/or Wo Contrast  Result Date: 05/18/2020 CLINICAL DATA:  Suspected pulmonary embolus. EXAM: CT ANGIOGRAPHY CHEST WITH CONTRAST TECHNIQUE: Multidetector CT imaging of the chest was performed using the standard protocol during bolus administration of intravenous contrast. Multiplanar CT image reconstructions and MIPs were obtained to evaluate the vascular anatomy. CONTRAST:  11mL OMNIPAQUE IOHEXOL 350 MG/ML SOLN COMPARISON:  January 04, 2020 FINDINGS: Cardiovascular: Satisfactory opacification of the pulmonary arteries to the segmental level. No evidence of pulmonary embolism. Enlarged heart. Calcific atherosclerotic disease of the coronary arteries and aorta. Mitral valve annular calcifications. Mediastinum/Nodes: No enlarged mediastinal, hilar, or axillary lymph nodes. Thyroid gland, trachea, and esophagus demonstrate no significant findings. Small hiatal hernia. Lungs/Pleura: Mild pulmonary vascular congestion. No focal airspace consolidation Upper Abdomen: No acute abnormality. Musculoskeletal: Compression deformities post vertebroplasty of T9 and T12 vertebral bodies. Review of the MIP images confirms the above findings. IMPRESSION: 1. No evidence of pulmonary embolism. 2. Enlarged heart with mild pulmonary vascular congestion. 3. Calcific atherosclerotic disease of the coronary arteries and aorta. 4. No evidence of consolidation in the lungs. 5. Small hiatal hernia. 6. Compression deformities post vertebroplasty of T9 and T12 vertebral bodies. Aortic Atherosclerosis (ICD10-I70.0). Electronically Signed   By: Fidela Salisbury M.D.   On: 05/18/2020 21:20        LOS: 0 days   [facility] Pager on [email or web address]  05/19/2020, 9:22 AM

## 2020-05-19 NOTE — Consult Note (Signed)
CARDIOLOGY CONSULT NOTE               Patient ID: Janice [name] MRN: [id number] DOB/AGE: Sep 16, 1928 86 y.o.  Admit date: 05/18/2020 Referring Physician Dr Bonnielee Haff hospitalist Primary 28 Dr Ezequiel Kayser primary Primary Cardiologist Dr. Lawerance Cruel PA Reason for Consultation atrial fibrillation possible heart failure hypoxemia  HPI: Patient is a 86 year old white female who presents with shortness of breath congestion cough shortness of breath hypoxemia.  Patient is complain of generalized weakness fatigue lack of activity denies any leg swelling no sputum production no chest pain no previous cardiac history.  Patient has been seen and evaluated by cardiology in the past for possible arrhythmias including monitoring which of all been unremarkable patient now presents with respiratory failure hypoxemia .  Patient was brought in because of generalized fatigue weakness and hypoxemia.  Denies any fever chills or sweats no nausea vomiting or diarrhea.  Patient denies any palpitations tachycardia or chest pain.  Not been evaluated for shortness of breath dyspnea shortness of breath.  Review of systems complete and found to be negative unless listed above     Past Medical History:  Diagnosis Date  . Breast cancer ([place]) 2017   DCIS of right breast  . Colon cancer ([place]) 2003   adenocarcinoma of the ascending colon, 2007  . Environmental allergies   . High cholesterol   . Hypertension   . Large cell lymphoma ([place]) 2007   Diffuse large cell lymphoma, completed CHOP and Rituxan in March 2004  . Non Hodgkin's lymphoma ([place]) 2003  . Personal history of chemotherapy 2003   Non Hodgkins  . Skin cancer     Past Surgical History:  Procedure Laterality Date  . BREAST BIOPSY Right 03/18/2016   DCIS  . BREAST BIOPSY Right 08/19/2017   ribbon marker, Fibrocystic changes- neg  . BREAST LUMPECTOMY Right 04/24/2016   DCIS clear margins. SN biopsy-negative. Declined rad tx.    Marland Kitchen BREAST LUMPECTOMY WITH NEEDLE LOCALIZATION Right 04/24/2016   Procedure: BREAST LUMPECTOMY WITH NEEDLE LOCALIZATION;  Surgeon: Jules Husbands, MD;  Location: [facility];  Service: General;  Laterality: Right;  . COLON SURGERY  11/24/05   Cancer surgery  . COLONOSCOPY     2012  . COLONOSCOPY N/A 08/28/2015   Procedure: COLONOSCOPY;  Surgeon: Hulen Luster, MD;  Location: [facility];  Service: Gastroenterology;  Laterality: N/A;  . DIAGNOSTIC MAMMOGRAM     May 2014  . KYPHOPLASTY N/A 03/28/2020   Procedure: T9 AND T12 KYPHOPLASTY;  Surgeon: Hessie Knows, MD;  Location: [facility];  Service: Orthopedics;  Laterality: N/A;  . LYMPH NODE DISSECTION  2003   neck - non-hodgkin's lymphoma  . SACROPLASTY N/A 01/04/2020   Procedure: SACROPLASTY;  Surgeon: Hessie Knows, MD;  Location: [facility];  Service: Orthopedics;  Laterality: N/A;  . SENTINEL NODE BIOPSY Right 04/24/2016   Procedure: SENTINEL NODE BIOPSY;  Surgeon: Jules Husbands, MD;  Location: [facility];  Service: General;  Laterality: Right;    (Not in a hospital admission)  Social History   Socioeconomic History  . Marital status: Widowed    Spouse name: Not on file  . Number of children: Not on file  . Years of education: Not on file  . Highest education level: Not on file  Occupational History  . Not on file  Tobacco Use  . Smoking status: Never Smoker  . Smokeless tobacco: Never Used  Vaping Use  . Vaping Use: Never used  Substance and Sexual Activity  . Alcohol use: No  . Drug use: No  . Sexual activity: Never  Other Topics Concern  . Not on file  Social History Narrative  . Not on file   Social Determinants of Health   Financial Resource Strain:   . Difficulty of Paying Living Expenses: Not on file  Food Insecurity:   . Worried About Charity fundraiser in the Last Year: Not on file  . Ran Out of Food in the Last Year: Not on file  Transportation Needs:   . Lack of Transportation (Medical): Not on file  . Lack of  Transportation (Non-Medical): Not on file  Physical Activity:   . Days of Exercise per Week: Not on file  . Minutes of Exercise per Session: Not on file  Stress:   . Feeling of Stress : Not on file  Social Connections:   . Frequency of Communication with Friends and Family: Not on file  . Frequency of Social Gatherings with Friends and Family: Not on file  . Attends Religious Services: Not on file  . Active Member of Clubs or Organizations: Not on file  . Attends Archivist Meetings: Not on file  . Marital Status: Not on file  Intimate Partner Violence:   . Fear of Current or Ex-Partner: Not on file  . Emotionally Abused: Not on file  . Physically Abused: Not on file  . Sexually Abused: Not on file    Family History  Problem Relation Age of Onset  . Breast cancer Sister 71       half sister  . Cancer Other        family history of colon cancer, breast cancer, lung cancer  . Cancer Paternal Uncle        colon  . Colon cancer Maternal Uncle   . Stomach cancer Maternal Grandmother   . Heart attack Mother 50  . Hypertension Mother   . Other Father        "old age"      Review of systems complete and found to be negative unless listed above      PHYSICAL EXAM  General: Well developed, well nourished, in no acute distress HEENT:  Normocephalic and atramatic Neck:  No JVD.  Lungs: Clear bilaterally to auscultation and percussion. Heart: HRRR . Normal S1 and S2 without gallops or murmurs.  Abdomen: Bowel sounds are positive, abdomen soft and non-tender  Msk:  Back normal, normal gait. Normal strength and tone for age. Extremities: No clubbing, cyanosis or edema.   Neuro: Alert and oriented X 3. Psych:  Good affect, responds appropriately  Labs:   Lab Results  Component Value Date   WBC 3.7 (L) 05/19/2020   HGB 10.9 (L) 05/19/2020   HCT 33.5 (L) 05/19/2020   MCV 94.9 05/19/2020   PLT 202 05/19/2020    Recent Labs  Lab 05/18/20 1536 05/18/20 1536  05/19/20 0640  NA 136   < > 133*  K 3.9   < > 3.4*  CL 99   < > 96*  CO2 26   < > 27  BUN 12   < > 12  CREATININE 0.86   < > 0.77  CALCIUM 8.8*   < > 8.5*  PROT 7.2  --   --   BILITOT 0.8  --   --   ALKPHOS 58  --   --   ALT 11  --   --   AST 17  --   --  GLUCOSE 124*   < > 102*   < > = values in this interval not displayed.   No results found for: CKTOTAL, CKMB, CKMBINDEX, TROPONINI No results found for: CHOL No results found for: HDL No results found for: LDLCALC No results found for: TRIG No results found for: CHOLHDL No results found for: LDLDIRECT    Radiology: DG Chest 2 View  Result Date: 05/18/2020 CLINICAL DATA:  Cough.  Increased respiratory rate. EXAM: CHEST - 2 VIEW COMPARISON:  01/04/2020 FINDINGS: Heart is enlarged. There is dense mitral calcification. There is minimal subsegmental atelectasis at the lung bases. No focal consolidations or pleural effusions. Remote 2 level vertebroplasty. IMPRESSION: Cardiomegaly. Electronically Signed   By: Nolon Nations M.D.   On: 05/18/2020 14:45   CT Angio Chest PE W and/or Wo Contrast  Result Date: 05/18/2020 CLINICAL DATA:  Suspected pulmonary embolus. EXAM: CT ANGIOGRAPHY CHEST WITH CONTRAST TECHNIQUE: Multidetector CT imaging of the chest was performed using the standard protocol during bolus administration of intravenous contrast. Multiplanar CT image reconstructions and MIPs were obtained to evaluate the vascular anatomy. CONTRAST:  21mL OMNIPAQUE IOHEXOL 350 MG/ML SOLN COMPARISON:  January 04, 2020 FINDINGS: Cardiovascular: Satisfactory opacification of the pulmonary arteries to the segmental level. No evidence of pulmonary embolism. Enlarged heart. Calcific atherosclerotic disease of the coronary arteries and aorta. Mitral valve annular calcifications. Mediastinum/Nodes: No enlarged mediastinal, hilar, or axillary lymph nodes. Thyroid gland, trachea, and esophagus demonstrate no significant findings. Small hiatal hernia.  Lungs/Pleura: Mild pulmonary vascular congestion. No focal airspace consolidation Upper Abdomen: No acute abnormality. Musculoskeletal: Compression deformities post vertebroplasty of T9 and T12 vertebral bodies. Review of the MIP images confirms the above findings. IMPRESSION: 1. No evidence of pulmonary embolism. 2. Enlarged heart with mild pulmonary vascular congestion. 3. Calcific atherosclerotic disease of the coronary arteries and aorta. 4. No evidence of consolidation in the lungs. 5. Small hiatal hernia. 6. Compression deformities post vertebroplasty of T9 and T12 vertebral bodies. Aortic Atherosclerosis (ICD10-I70.0). Electronically Signed   By: Fidela Salisbury M.D.   On: 05/18/2020 21:20    EKG: Atrial fibrillation rapid ventricular response rate of 110 nonspecific findings  ASSESSMENT AND PLAN:  Acute hypoxic respiratory failure Hypoxemia Shortness of breath Acute bronchitis Hypertension Atrial fibrillation new Hyperlipidemia History of breast cancer Generalized weakness History of non-Hodgkin's lymphoma Recurrent falls . Plan Agree with admission with telemetry Recommend aggressive pulmonary therapy inhalers possible steroids consider pulmonary consult Continue therapy for hypertension amlodipine lisinopril Short-term anticoagulation for A. fib and DVT prophylaxis Will determine long-term anticoagulation for A. fib prior to discharge Recommend physical therapy for generalized weakness Recommend echocardiogram for further evaluation left ventricular function valvular evaluation Continue statin therapy for lipid management Supplemental oxygen therapy for hypoxemia Will consider low-dose diuretic therapy for mild heart failure   Signed: Yolonda Kida MD 05/19/2020, 9:46 AM

## 2020-05-19 NOTE — Plan of Care (Signed)
  Problem: Education: Goal: Knowledge of General Education information will improve Description: Including pain rating scale, medication(s)/side effects and non-pharmacologic comfort measures Outcome: Progressing   Problem: Health Behavior/Discharge Planning: Goal: Ability to manage health-related needs will improve Outcome: Progressing   Problem: Clinical Measurements: Goal: Ability to maintain clinical measurements within normal limits will improve Outcome: Progressing Goal: Will remain free from infection Outcome: Progressing Goal: Diagnostic test results will improve Outcome: Progressing Goal: Respiratory complications will improve Outcome: Progressing Goal: Cardiovascular complication will be avoided Outcome: Progressing   Problem: Activity: Goal: Risk for activity intolerance will decrease Outcome: Progressing   Problem: Nutrition: Goal: Adequate nutrition will be maintained Outcome: Progressing   Problem: Coping: Goal: Level of anxiety will decrease Outcome: Progressing   Problem: Elimination: Goal: Will not experience complications related to bowel motility Outcome: Progressing Goal: Will not experience complications related to urinary retention Outcome: Progressing   Problem: Pain Managment: Goal: General experience of comfort will improve Outcome: Progressing   Problem: Safety: Goal: Ability to remain free from injury will improve Outcome: Progressing   Problem: Skin Integrity: Goal: Risk for impaired skin integrity will decrease Outcome: Progressing   Patient oriented to self and time, disoriented to place and situation. Patient stating that she is scared and keeps asking if staff is there to rob her or kill her and states she doesn't understand how we got in her home. Patient remains confused even after being reoriented. RN called her son Vicente Serene this evening and patient seemed to be a little better after speaking with her son but as the night continues  she still remains fearful. Staff continues to reassure that patient is safe here.  Call bell within reach, will continue with plan of care and frequent rounding.

## 2020-05-19 NOTE — ED Notes (Signed)
Pt assisted to commode with another RN, pt very weak and unsteady, labored breathing  upon return to bed.  O2 maintained 90-91% RA.

## 2020-05-19 NOTE — ED Notes (Signed)
Advised nurse that patient has assigned bed 

## 2020-05-20 DIAGNOSIS — R410 Disorientation, unspecified: Secondary | ICD-10-CM

## 2020-05-20 DIAGNOSIS — Z7189 Other specified counseling: Secondary | ICD-10-CM

## 2020-05-20 DIAGNOSIS — Z515 Encounter for palliative care: Secondary | ICD-10-CM

## 2020-05-20 DIAGNOSIS — D0511 Intraductal carcinoma in situ of right breast: Secondary | ICD-10-CM

## 2020-05-20 LAB — TSH: TSH: 0.918 u[IU]/mL (ref 0.350–4.500)

## 2020-05-20 LAB — BASIC METABOLIC PANEL
Anion gap: 11 (ref 5–15)
BUN: 17 mg/dL (ref 8–23)
CO2: 26 mmol/L (ref 22–32)
Calcium: 8.5 mg/dL — ABNORMAL LOW (ref 8.9–10.3)
Chloride: 96 mmol/L — ABNORMAL LOW (ref 98–111)
Creatinine, Ser: 0.8 mg/dL (ref 0.44–1.00)
GFR, Estimated: >60 mL/min (ref >60)
Glucose, Bld: 140 mg/dL — ABNORMAL HIGH (ref 70–99)
Potassium: 4.2 mmol/L (ref 3.5–5.1)
Sodium: 133 mmol/L — ABNORMAL LOW (ref 135–145)

## 2020-05-20 LAB — CBC
HCT: 32 % — ABNORMAL LOW (ref 36.0–46.0)
Hemoglobin: 10.6 g/dL — ABNORMAL LOW (ref 12.0–15.0)
MCH: 31.1 pg (ref 26.0–34.0)
MCHC: 33.1 g/dL (ref 30.0–36.0)
MCV: 93.8 fL (ref 80.0–100.0)
Platelets: 208 10*3/uL (ref 150–400)
RBC: 3.41 MIL/uL — ABNORMAL LOW (ref 3.87–5.11)
RDW: 14.2 % (ref 11.5–15.5)
WBC: 4.4 10*3/uL (ref 4.0–10.5)
nRBC: 0 % (ref 0.0–0.2)

## 2020-05-20 LAB — ECHOCARDIOGRAM COMPLETE
AR max vel: 1.67 cm2
AV Area VTI: 1.66 cm2
AV Area mean vel: 1.41 cm2
AV Mean grad: 7 mmHg
AV Peak grad: 13.1 mmHg
Ao pk vel: 1.81 m/s
Area-P 1/2: 4.63 cm2
Height: 64 in
S' Lateral: 3.28 cm
Weight: 2560 oz

## 2020-05-20 MED ORDER — ENOXAPARIN SODIUM 40 MG/0.4ML ~~LOC~~ SOLN
40.0000 mg | SUBCUTANEOUS | Status: DC
  Administered 2020-05-21: 40 mg via SUBCUTANEOUS
  Filled 2020-05-20: qty 0.4

## 2020-05-20 MED ORDER — RISPERIDONE 1 MG PO TBDP
0.5000 mg | ORAL_TABLET | Freq: Two times a day (BID) | ORAL | Status: DC
  Administered 2020-05-20 – 2020-05-21 (×2): 0.5 mg via ORAL
  Filled 2020-05-20 (×3): qty 0.5

## 2020-05-20 MED ORDER — ENOXAPARIN SODIUM 30 MG/0.3ML ~~LOC~~ SOLN: 30.0000 mg | SUBCUTANEOUS | Status: DC

## 2020-05-20 MED ORDER — METHYLPREDNISOLONE SODIUM SUCC 40 MG IJ SOLR: 40.0000 mg | Freq: Every day | INTRAMUSCULAR | Status: DC

## 2020-05-20 MED ORDER — HALOPERIDOL LACTATE 5 MG/ML IJ SOLN
1.0000 mg | Freq: Four times a day (QID) | INTRAMUSCULAR | Status: DC | PRN
  Administered 2020-05-20: 1 mg via INTRAVENOUS
  Filled 2020-05-20: qty 1

## 2020-05-20 MED ORDER — IPRATROPIUM-ALBUTEROL 0.5-2.5 (3) MG/3ML IN SOLN
3.0000 mL | Freq: Two times a day (BID) | RESPIRATORY_TRACT | Status: DC
  Filled 2020-05-20: qty 3

## 2020-05-20 NOTE — Evaluation (Signed)
Occupational Therapy Evaluation Patient Details Name: Janice [name] MRN: [id number] DOB: [birth year] Today's Date: 05/20/2020    History of Present Illness Pt is an 86 y.o. 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  MD assessment includes: Acute diastolic CHF/acute respiratory failure with hypoxia, A-fib, Hyponatremia/hypokalemia, HTN, and normocytic anemia.   Clinical Impression   Ms Tift was seen for OT evaluation this date. Prior to hospital admission, pt was MOD I for household mobility and requiring assist for ADLs. Pt lives c son available PRN in house c 6 STE. Pt initially answers orientation questions appropriately however STM deficits noted and makes confused comments "why is there an ocean there's no ocean in [place]" "it's weird that there is no traffic and no lights on in that vacant building." Pt is anxious stating she does not know where she is, however calms considerably once assisted to call son. Pt presents to acute OT demonstrating impaired ADL performance and functional mobility 2/2 decreased safety awareness, functional strength/balance deficits, and decreased activity tolerance.   Pt currently requires MIN A for BSC t/f and perihygiene, MOD A for clothing mgmt in standing - assist for balance, pulling underwear over rear, and VCs for safety. SETUP self-feeding seated in chair. SpO2 96% on 4.5L [place], stable t/o session. Pt left reclined in chair c chair alarm set, call bell in reach, all needs met, and RN aware. Pt would benefit from skilled OT to address noted impairments and functional limitations (see below for any additional details) in order to maximize safety and independence while  minimizing falls risk and caregiver burden. Upon hospital discharge, recommend STR to maximize pt safety and return to PLOF.     Follow Up Recommendations  SNF    Equipment Recommendations  Other (comment) (TBD)    Recommendations for Other Services       Precautions / Restrictions Precautions Precautions: Fall Precaution Comments: Delirium PCNs Restrictions Weight Bearing Restrictions: No      Mobility Bed Mobility Overal bed mobility: Needs Assistance Bed Mobility: Supine to Sit     Supine to sit: Min assist     General bed mobility comments: assist trunk control and sequencing     Transfers Overall transfer level: Needs assistance Equipment used: 1 person hand held assist Transfers: Sit to/from Stand Sit to Stand: Min assist;From elevated surface       Balance Overall balance assessment: Needs assistance Sitting-balance support: Feet supported;No upper extremity supported Sitting balance-Leahy Scale: Fair     Standing balance support: Bilateral upper extremity supported;During functional activity Standing balance-Leahy Scale: Poor        ADL either performed or assessed with clinical judgement   ADL Overall ADL's : Needs assistance/impaired      General ADL Comments: MIN A for BSC t/f and perihygiene and clothing mgmt in standing - assist for balance, pulling lcothes over rear, and VCs for safety. SETUP self-feeding seated in chair. MOD A for LBD in sitting                  Pertinent Vitals/Pain Pain Assessment: No/denies pain     Hand Dominance Right   Extremity/Trunk Assessment Upper Extremity Assessment Upper Extremity Assessment: Generalized weakness   Lower Extremity Assessment Lower Extremity Assessment: Generalized  weakness       Communication Communication Communication: No difficulties   Cognition Arousal/Alertness: Awake/alert Behavior During Therapy: Anxious;WFL for tasks assessed/performed Overall Cognitive Status: No  family/caregiver present to determine baseline cognitive functioning            General Comments: Answers orientation questions appropriately however STM deficits noted and comments "why is there an ocean there's no ocean in [place]" "it's weird that there is no traffic and no lights on in that vacant building." Pt is anxious until son called who reassures her.    General Comments  SpO2 96% on 4.5L [place], stable t/o session    Exercises Exercises: Other exercises Other Exercises Other Exercises: Pt educated re: OT role, DME recs, d/c recs, falls prevention, re-orietnation to situation/place, ECS Other Exercises: LBD, toileting, sup>sit, sit<>stand, SPT, sitting/standing balance/tolerance   Shoulder Instructions      Home Living Family/patient expects to be discharged to:: Private residence Living Arrangements: Children Available Help at Discharge: Available PRN/intermittently Type of Home: House Home Access: Stairs to enter [employer] of Steps: 6 Entrance Stairs-Rails: Right Home Layout: One level      Home Equipment: Bedside commode;Cane - single point;Walker - 4 wheels          Prior Functioning/Environment Level of Independence: Needs assistance  Gait / Transfers Assistance Needed: Mod Ind amb in the house with a rollator, w/c for MD appointments, sons assist pt up/down stairs and with car transfers ADL's / Homemaking Assistance Needed: Some assistance required with ADLs recently            OT Problem List: Decreased strength;Decreased activity tolerance;Impaired balance (sitting and/or standing);Decreased safety awareness;Decreased cognition;Decreased knowledge of use of DME or AE      OT Treatment/Interventions: Self-care/ADL training;Therapeutic exercise;Energy conservation;DME and/or AE instruction;Therapeutic activities;Patient/family education;Balance training;Cognitive remediation/compensation    OT Goals(Current goals can be found in the care  plan section) Acute Rehab OT Goals Patient Stated Goal: To get stronger OT Goal Formulation: With patient Time For Goal Achievement: 06/03/20 Potential to Achieve Goals: Good ADL Goals Pt Will Perform Grooming: with modified independence;sitting Pt Will Perform Lower Body Dressing: with supervision;sit to/from stand (c LRAD PRN) Pt Will Transfer to Toilet: with supervision;ambulating;bedside commode (c LRAD PRN)  OT Frequency: Min 1X/week   Barriers to D/C: Inaccessible home environment             AM-PAC OT "6 Clicks" Daily Activity     Outcome Measure Help from another person eating meals?: None Help from another person taking care of personal grooming?: A Little Help from another person toileting, which includes using toliet, bedpan, or urinal?: A Little Help from another person bathing (including washing, rinsing, drying)?: A Lot Help from another person to put on and taking off regular upper body clothing?: A Little Help from another person to put on and taking off regular lower body clothing?: A Lot 6 Click Score: 17   End of Session Equipment Utilized During Treatment: Oxygen Nurse Communication: Mobility status  Activity Tolerance: Patient tolerated treatment well Patient left: in chair;with call bell/phone within reach;with chair alarm set  OT Visit Diagnosis: Other abnormalities of gait and mobility (R26.89);Muscle weakness (generalized) (M62.81)                Time: 4585-9292 OT Time Calculation (min): 32 min Charges:  OT General Charges $OT Visit: 1 Visit OT Evaluation $OT Eval Moderate Complexity: 1 Mod OT Treatments $Self Care/Home Management : 23-37 mins  Dessie Coma, M.S. OTR/L  05/20/20, 9:36 AM  ascom [phone]

## 2020-05-20 NOTE — Evaluation (Signed)
Clinical/Bedside Swallow Evaluation Patient Details  Name: Janice [name] MRN: [id number] Date of Birth: 10/01/1928  Today's Date: 05/20/2020 Time: SLP Start Time (ACUTE ONLY): 32 SLP Stop Time (ACUTE ONLY): 1427 SLP Time Calculation (min) (ACUTE ONLY): 32 min  Past Medical History:  Past Medical History:  Diagnosis Date  . Breast cancer ([place]) 2017   DCIS of right breast  . Colon cancer ([place]) 2003   adenocarcinoma of the ascending colon, 2007  . Environmental allergies   . High cholesterol   . Hypertension   . Large cell lymphoma ([place]) 2007   Diffuse large cell lymphoma, completed CHOP and Rituxan in March 2004  . Non Hodgkin's lymphoma ([place]) 2003  . Personal history of chemotherapy 2003   Non Hodgkins  . Skin cancer    Past Surgical History:  Past Surgical History:  Procedure Laterality Date  . BREAST BIOPSY Right 03/18/2016   DCIS  . BREAST BIOPSY Right 08/19/2017   ribbon marker, Fibrocystic changes- neg  . BREAST LUMPECTOMY Right 04/24/2016   DCIS clear margins. SN biopsy-negative. Declined rad tx.   Mar[facility] Kitchen BREAST LUMPECTOMY WITH NEEDLE LOCALIZATION Right 04/24/2016   Procedure: BREAST LUMPECTOMY WITH NEEDLE LOCALIZATION;  Surgeon: Jules Husbands, MD;  Location: [facility];  Service: General;  Laterality: Right;  . COLON SURGERY  11/24/05   Cancer surgery  . COLONOSCOPY     2012  . COLONOSCOPY N/A 08/28/2015   Procedure: COLONOSCOPY;  Surgeon: Hulen Luster, MD;  Location: [facility];  Service: Gastroenterology;  Laterality: N/A;  . DIAGNOSTIC MAMMOGRAM     May 2014  . KYPHOPLASTY N/A 03/28/2020   Procedure: T9 AND T12 KYPHOPLASTY;  Surgeon: Hessie Knows, MD;  Location: [facility];  Service: Orthopedics;  Laterality: N/A;  . LYMPH NODE DISSECTION  2003   neck - non-hodgkin's lymphoma  . SACROPLASTY N/A 01/04/2020   Procedure: SACROPLASTY;  Surgeon: Hessie Knows, MD;  Location: [facility];  Service: Orthopedics;  Laterality: N/A;  . SENTINEL NODE BIOPSY Right 04/24/2016    Procedure: SENTINEL NODE BIOPSY;  Surgeon: Jules Husbands, MD;  Location: [facility];  Service: General;  Laterality: Right;   HPI:  Per admitting H and P "Janice [name] is a 86 y.o. 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 Bedside swallowing evaluation today revealed functional swallowing abilities with no overt s/s of aspiration. Oral mech exam revealed structures to be functioning adequately with no apparent weakness. Pt tolerated sips of thin liquid by cup and straw without difficulty. Slight oral transit delay with dry solids with mild oral residue which Pt cleared with alternating sips of thin liquid. Vocal quality remained clear, laryngeal elevation appeared adequate. Rec continue with heart healthy diet as Pt tolerated this diet well at lunch today. ST to follow up with toleration of diet and alter if needed. Precautions posted above bed. SLP Visit Diagnosis: Dysphagia, oral phase (R13.11)    Aspiration Risk  Mild aspiration risk    Diet Recommendation Regular   Liquid Administration via: Cup;Straw Medication Administration:  Whole meds with liquid Supervision: Patient able to self feed Compensations: Slow rate;Small sips/bites;Minimize environmental distractions Postural Changes: Seated upright at 90 degrees;Remain upright for at least 30 minutes after po intake    Other  Recommendations   N/A  Follow up Recommendations   ST to follow up with toleration of diet      Frequency and Duration min 1 x/week  1 week       Prognosis Prognosis for Safe Diet Advancement: Good      Swallow Study   General Date of Onset: 05/18/20 HPI: Per admitting H and P "Janice [name] is a 86 y.o. 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 currently on tamoxifen, sacral fracture s/p sacral plasty 01/04/2020, s/p kyphoplasty T9 T12 on 03/28/2020 who presents to the ED for evaluation of cough and hypoxia history somewhat limited from patient and otherwise supplemented by EDP, chart review, and son at bedside." Type of Study: Bedside Swallow Evaluation Diet Prior to this Study: Regular Temperature Spikes Noted: No Respiratory Status: Nasal cannula History of Recent Intubation: No Behavior/Cognition: Alert;Cooperative;Pleasant mood Oral Cavity Assessment: Within Functional Limits Oral Care Completed by SLP: No Oral Cavity - Dentition: Adequate natural dentition Vision: Functional for self-feeding Self-Feeding Abilities: Able to feed self Patient Positioning: Upright in bed Baseline Vocal Quality: Normal Volitional Cough: Strong    Oral/Motor/Sensory Function Overall Oral Motor/Sensory Function: Within functional limits   Ice Chips Ice chips: Within functional limits   Thin Liquid Thin Liquid: Within functional limits Presentation: Cup;Spoon;Self Fed;Straw    Nectar Thick Nectar Thick Liquid: Not tested   Honey Thick Honey Thick Liquid: Not tested   Puree Puree: Within functional limits Presentation: Spoon   Solid     Solid: Impaired Presentation: Self Fed Oral Phase Functional Implications: Prolonged oral transit;Oral residue      Janice [name] 05/20/2020,2:29 PM

## 2020-05-20 NOTE — Progress Notes (Addendum)
[facility] PROGRESS NOTE   Janice [name] VVO:[id number] DOB: 11/15/1928 DOA: 05/18/2020  PCP: Ezequiel Kayser, MD  Brief History/Interval Summary: 86 y.o. 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  Most of the history was obtained from the patient's son.  Apparently after recent back procedures she had been generally weak and has spent majority of time in her bed.  Evaluation in the emergency department revealed that she was hypoxic.  She was placed on 2 L of oxygen.  Imaging studies suggested vascular congestion in the lungs.  EKG also raise suspicion for new onset atrial fibrillation versus atrial flutter.  No infectious process was noted.  No PE was noted on CT scan.  Patient was hospitalized for further management.   Reason for Visit: Acute congestive heart failure possibly diastolic.  Consultants: Cardiologist from [place] clinic: Dr. Clayborn Bigness  Procedures: None yet  Antibiotics: Anti-infectives (From admission, onward)   None      Subjective/Interval History:  Nursing reports agitation and confusion especially overnight. When I talked to patient she shares -"I feel deserted. Can you call my son". She does not feel well but could not describe more  Assessment/Plan:  Acute diastolic CHF/acute respiratory failure with hypoxia Her hypoxia appears to be most likely due to congestive heart failure.  No definite pulmonary edema noted on imaging studies however she was found to have vascular congestion.  Noted to have lower extremity edema as well.  No other reason for her respiratory failure noted.  PE was ruled out as well as pneumonia.  Previous echocardiogram from June 2021 showed  normal systolic function.  Repeat echocardiogram has been ordered.  Patient started on diuretics.  Continue to monitor strict ins and outs and daily weights.  Patient previously seen by cardiology with [facility].    Acute delirium Likely due to being in a new place. Steroids and benzos could make this worse I will stop steroids and Xanax. Frequent reorientation and family visits would be helpful. Discussed with son I have added Haldol. We will add Risperdal 0.5 mg p.o. twice daily, could increase to 1 mg p.o. twice daily tomorrow if she improves  Paroxysmal A. fib Appreciate cardiology input Heart rate is well controlled at this time. Continue monitoring on off unit telemetry  Hyponatremia and hypokalemia Repleted and resolved  Generalized weakness/physical deconditioning with frequent falls She recently underwent kyphoplasty T9 T12 as well as sacral plasty in June.  She has been progressively getting weaker per her son.  PT and OT recommends SNF.    Essential hypertension Controlled on amlodipine and lisinopril.  History of non-Hodgkin's lymphoma status post R-CHOP in 2004/history of colon cancer status post resection in 2007/history of right breast DCIS status post right lumpectomy Follows with oncology, Dr. Mike Gip.  Continue with home medications including tamoxifen.  Hyperlipidemia Continue atorvastatin  Normocytic anemia No evidence of overt bleeding.    DVT Prophylaxis: Lovenox Code Status: DNR Family Communication: Discussed with the patient's son Vicente Serene at [phone] Disposition Plan: To be determined. SNF  Status is: Inpatient  Remains inpatient appropriate because:Unsafe d/c plan   Dispo:  Patient From: Home  Planned Disposition: [place]  Expected discharge date: 05/22/20  Medically stable for discharge: No gets very confused  and agitated. Still waiting for insurance authorization for SNF placement. Waiting for palliative care  evaluation.     Medications:  Scheduled: . amLODipine  10 mg Oral Daily  . aspirin EC  81 mg Oral Daily  . atorvastatin  10 mg Oral q1800  . [START ON 05/21/2020] enoxaparin (LOVENOX) injection  40 mg Subcutaneous Q24H  . furosemide  40 mg Intravenous Q12H  . influenza vaccine adjuvanted  0.5 mL Intramuscular Tomorrow-1000  . ipratropium-albuterol  3 mL Nebulization BID  . lisinopril  20 mg Oral Daily  . methylPREDNISolone (SOLU-MEDROL) injection  40 mg Intravenous Q12H  . tamoxifen  20 mg Oral Daily   Continuous:  YTK:[id number] **OR** acetaminophen, ALPRAZolam, haloperidol lactate, polyethylene glycol   Objective:  Vital Signs  Vitals:   05/20/20 0827 05/20/20 1000 05/20/20 1158 05/20/20 1508  BP: 136/66  (!) 116/52 131/60  Pulse: 94  86 81  Resp: 20  16 20   Temp: 98 F (36.7 C)  98.3 F (36.8 C) 98.7 F (37.1 C)  TempSrc: Oral  Oral Oral  SpO2: 92%  96% 99%  Weight:  68.3 kg    Height:        Intake/Output Summary (Last 24 hours) at 05/20/2020 1539 Last data filed at 05/20/2020 1300 Gross per 24 hour  Intake 240 ml  Output 300 ml  Net -60 ml   Filed Weights   05/18/20 1532 05/20/20 1000  Weight: 72.6 kg 68.3 kg    General appearance: Awake alert.  In no distress.  Mildly distracted Resp: Mildly tachypneic at rest.  No use of accessory muscles.  Crackles bilateral bases.  No wheezing or rhonchi. Cardio: S1-S2 is noted to be irregular.  No S3-S4.  No rubs murmurs or bruit GI: Abdomen is soft.  Nontender nondistended.  Bowel sounds are present normal.  No masses organomegaly Extremities: Mild edema.  Able to move her legs.  Noted to be deconditioned. Neurologic: .  No focal neurological deficits.    Lab Results:  Data Reviewed: I have personally reviewed following labs and imaging studies  CBC: Recent Labs  Lab 05/18/20 1536 05/19/20 0640 05/20/20 0549  WBC 4.8 3.7* 4.4  NEUTROABS 3.4  --   --   HGB 11.9* 10.9* 10.6*  HCT 37.3 33.5* 32.0*    MCV 95.4 94.9 93.8  PLT 199 202 001    Basic Metabolic Panel: Recent Labs  Lab 05/18/20 1536 05/19/20 0640 05/20/20 0549  NA 136 133* 133*  K 3.9 3.4* 4.2  CL 99 96* 96*  CO2 26 27 26   GLUCOSE 124* 102* 140*  BUN 12 12 17   CREATININE 0.86 0.77 0.80  CALCIUM 8.8* 8.5* 8.5*  MG  --  2.1  --     GFR: Estimated Creatinine Clearance: 45.2 mL/min (by C-G formula based on SCr of 0.8 mg/dL).  Liver Function Tests: Recent Labs  Lab 05/18/20 1536  AST 17  ALT 11  ALKPHOS 58  BILITOT 0.8  PROT 7.2  ALBUMIN 3.7     Recent Results (from the past 240 hour(s))  SARS CORONAVIRUS 2 (TAT 6-24 HRS) Nasopharyngeal Nasopharyngeal Swab     Status: None   Collection Time: 05/18/20  2:19 PM   Specimen: Nasopharyngeal Swab  Result Value Ref Range Status   SARS Coronavirus 2 NEGATIVE NEGATIVE Final    Comment: (NOTE) SARS-CoV-2 target nucleic acids are NOT DETECTED.  The SARS-CoV-2 RNA is generally detectable in upper and lower respiratory specimens during the acute phase of infection. Negative  results do not preclude SARS-CoV-2 infection, do not rule out co-infections with other pathogens, and should not be used as the sole basis for treatment or other patient management decisions. Negative results must be combined with clinical observations, patient history, and epidemiological information. The expected result is Negative.  Fact Sheet for Patients: SugarRoll.be  Fact Sheet for Healthcare Providers: https://www.woods-mathews.com/  This test is not yet approved or cleared by the [place] FDA and  has been authorized for detection and/or diagnosis of SARS-CoV-2 by FDA under an Emergency Use Authorization (EUA). This EUA will remain  in effect (meaning this test can be used) for the duration of the COVID-19 declaration under Se ction 564(b)(1) of the Act, 21 U.S.C. section 360bbb-3(b)(1), unless the authorization is terminated  or revoked sooner.  Performed at [facility], [place] [address] [place], [place] [address]       Radiology Studies: CT Angio Chest PE W and/or Wo Contrast  Result Date: 05/18/2020 CLINICAL DATA:  Suspected pulmonary embolus. EXAM: CT ANGIOGRAPHY CHEST WITH CONTRAST TECHNIQUE: Multidetector CT imaging of the chest was performed using the standard protocol during bolus administration of intravenous contrast. Multiplanar CT image reconstructions and MIPs were obtained to evaluate the vascular anatomy. CONTRAST:  34mL OMNIPAQUE IOHEXOL 350 MG/ML SOLN COMPARISON:  January 04, 2020 FINDINGS: Cardiovascular: Satisfactory opacification of the pulmonary arteries to the segmental level. No evidence of pulmonary embolism. Enlarged heart. Calcific atherosclerotic disease of the coronary arteries and aorta. Mitral valve annular calcifications. Mediastinum/Nodes: No enlarged mediastinal, hilar, or axillary lymph nodes. Thyroid gland, trachea, and esophagus demonstrate no significant findings. Small hiatal hernia. Lungs/Pleura: Mild pulmonary vascular congestion. No focal airspace consolidation Upper Abdomen: No acute abnormality. Musculoskeletal: Compression deformities post vertebroplasty of T9 and T12 vertebral bodies. Review of the MIP images confirms the above findings. IMPRESSION: 1. No evidence of pulmonary embolism. 2. Enlarged heart with mild pulmonary vascular congestion. 3. Calcific atherosclerotic disease of the coronary arteries and aorta. 4. No evidence of consolidation in the lungs. 5. Small hiatal hernia. 6. Compression deformities post vertebroplasty of T9 and T12 vertebral bodies. Aortic Atherosclerosis (ICD10-I70.0). Electronically Signed   By: Fidela Salisbury M.D.   On: 05/18/2020 21:20       LOS: 1 day   [facility] Pager on [email or web address]  05/20/2020, 3:39 PM

## 2020-05-20 NOTE — Progress Notes (Signed)
Pt son Vicente Serene made aware that the patient is moving to [address].

## 2020-05-20 NOTE — Consult Note (Signed)
Consultation Note Date: 05/20/2020   Patient Name: Janice [name]  DOB: [birth year]  MRN: [id number]  Age / Sex: 86 y.o., female  PCP: Ezequiel Kayser, MD Referring Physician: Max Sane, MD  Reason for Consultation: Establishing goals of care  HPI/Patient Profile: 86 y.o. female  with past medical history of hypertension, hyperlipidemia, non-Hodgkin's lymphoma 2004, colon cancer s/p resection 2007, right breast cancer s/p lumpectomy 2017 on tamoxifen, sacral fracture s/p sacral plasty June 2021, s/p kyphoplasty T9 T10 admitted on 05/18/2020 with cough and acute diastolic CHF, delirium.   Clinical Assessment and Goals of Care: I met today with Janice [name] who is awake and alert but somewhat confused. She speaks appropriately mostly but then also seems to repeat herself. She knows she is in the hospital and that she was very confused. She tells me that her 2 sons live with her and she has had many problems with her back. Overall she is feeling better.   I called and spoke with son, Janice [name]. He reports that Janice [name] typically has a good appetite but is mostly bed-bound at home. She does get up to bedside commode and walks with walker into bathroom on occasion and occasionally to the kitchen but otherwise in bed. He gets frustrated as he tries to encourage her to be active and exercise but she is not receptive. We pondered if she is afraid of falling and this is why she stays in bed and also if cognitive decline is causing her to forget or be less responsive to these encouragements but it is difficult to say for sure.   Ultimately Janice [name] shares that he wants his mother to have the best quality of life as possible and this is his main goal. He also feels if she were able to do more she would have improved quality. He also does not want her to suffer as she likely has limited time left to live at her age. We discussed palliative care  and hospice services. She is not eligible for hospice facility. There could be potential for home hospice in the near future and dependent on goals but Janice [name] reports that his mother has had a hoarding problem and will not allow people into the home. We discussed best option at this time to pursue short term rehab placement with palliative to follow and make further recommendations based on progress.   All questions/concerns addressed. Emotional support provided.   Primary Decision Maker NEXT OF KIN adult sons    SUMMARY OF RECOMMENDATIONS   - Pursue SNF rehab with palliative to follow - Family open to hospice in the future - QOL is ultimate goal  Code Status/Advance Care Planning:  DNR   Symptom Management:   Per primary  Palliative Prophylaxis:   Aspiration, Bowel Regimen, Delirium Protocol, Frequent Pain Assessment and Turn Reposition  Psycho-social/Spiritual:   Desire for further Chaplaincy support:no  Additional Recommendations: Caregiving  Support/Resources and Education on Hospice  Prognosis:   Overall prognosis poor with poor functional status and generalized decline over the past  several months.   Discharge Planning: [place] for rehab with Palliative care service follow-up      Primary Diagnoses: Present on Admission: . Acute respiratory failure with hypoxia ([place]) . HTN (hypertension) . Hypercholesterolemia . Ductal carcinoma in situ (DCIS) of right breast   I have reviewed the medical record, interviewed the patient and family, and examined the patient. The following aspects are pertinent.  Past Medical History:  Diagnosis Date  . Breast cancer ([place]) 2017   DCIS of right breast  . Colon cancer ([place]) 2003   adenocarcinoma of the ascending colon, 2007  . Environmental allergies   . High cholesterol   . Hypertension   . Large cell lymphoma ([place]) 2007   Diffuse large cell lymphoma, completed CHOP and Rituxan in March 2004  . Non  Hodgkin's lymphoma ([place]) 2003  . Personal history of chemotherapy 2003   Non Hodgkins  . Skin cancer    Social History   Socioeconomic History  . Marital status: Widowed    Spouse name: Not on file  . Number of children: Not on file  . Years of education: Not on file  . Highest education level: Not on file  Occupational History  . Not on file  Tobacco Use  . Smoking status: Never Smoker  . Smokeless tobacco: Never Used  Vaping Use  . Vaping Use: Never used  Substance and Sexual Activity  . Alcohol use: No  . Drug use: No  . Sexual activity: Never  Other Topics Concern  . Not on file  Social History Narrative  . Not on file   Social Determinants of Health   Financial Resource Strain:   . Difficulty of Paying Living Expenses: Not on file  Food Insecurity:   . Worried About Charity fundraiser in the Last Year: Not on file  . Ran Out of Food in the Last Year: Not on file  Transportation Needs:   . Lack of Transportation (Medical): Not on file  . Lack of Transportation (Non-Medical): Not on file  Physical Activity:   . Days of Exercise per Week: Not on file  . Minutes of Exercise per Session: Not on file  Stress:   . Feeling of Stress : Not on file  Social Connections:   . Frequency of Communication with Friends and Family: Not on file  . Frequency of Social Gatherings with Friends and Family: Not on file  . Attends Religious Services: Not on file  . Active Member of Clubs or Organizations: Not on file  . Attends Archivist Meetings: Not on file  . Marital Status: Not on file   Family History  Problem Relation Age of Onset  . Breast cancer Sister 10       half sister  . Cancer Other        family history of colon cancer, breast cancer, lung cancer  . Cancer Paternal Uncle        colon  . Colon cancer Maternal Uncle   . Stomach cancer Maternal Grandmother   . Heart attack Mother 76  . Hypertension Mother   . Other Father        "old age"    Scheduled Meds: . amLODipine  10 mg Oral Daily  . aspirin EC  81 mg Oral Daily  . atorvastatin  10 mg Oral q1800  . [START ON 05/21/2020] enoxaparin (LOVENOX) injection  40 mg Subcutaneous Q24H  . furosemide  40 mg Intravenous Q12H  . influenza  vaccine adjuvanted  0.5 mL Intramuscular Tomorrow-1000  . ipratropium-albuterol  3 mL Nebulization BID  . lisinopril  20 mg Oral Daily  . methylPREDNISolone (SOLU-MEDROL) injection  40 mg Intravenous Q12H  . tamoxifen  20 mg Oral Daily   Continuous Infusions: PRN Meds:.acetaminophen **OR** acetaminophen, ALPRAZolam, haloperidol lactate, polyethylene glycol No Known Allergies Review of Systems  Unable to perform ROS: Other  Neurological: Positive for weakness.    Physical Exam Vitals and nursing note reviewed.  Constitutional:      General: She is awake. She is not in acute distress.    Appearance: She is ill-appearing.  Cardiovascular:     Rate and Rhythm: Normal rate.  Pulmonary:     Effort: No tachypnea, accessory muscle usage or respiratory distress.     Comments: Shortness of breath with even movement in bed per RN Abdominal:     General: Abdomen is flat.     Palpations: Abdomen is soft.  Neurological:     Mental Status: She is alert. She is confused.     Vital Signs: BP 131/60 (BP Location: Left Arm)   Pulse 81   Temp 98.7 F (37.1 C) (Oral)   Resp 20   Ht _0  (1.626 m)   Wt 68.3 kg   SpO2 99%   BMI 25.85 kg/m  Pain Scale: 0-10   Pain Score: 0-No pain   SpO2: SpO2: 99 % O2 Device:SpO2: 99 % O2 Flow Rate: .O2 Flow Rate (L/min): 4.5 L/min  IO: Intake/output summary:   Intake/Output Summary (Last 24 hours) at 05/20/2020 1537 Last data filed at 05/20/2020 1300 Gross per 24 hour  Intake 240 ml  Output 300 ml  Net -60 ml    LBM: Last BM Date: 05/19/20 Baseline Weight: Weight: 72.6 kg Most recent weight: Weight: 68.3 kg     Palliative Assessment/Data:     Time In: 1615 Time Out: 1725 Time Total: 70  min Greater than 50%  of this time was spent counseling and coordinating care related to the above assessment and plan.  Signed by: Vinie Sill, NP Palliative Medicine Team Pager # [phone] (M-F 8a-5p) Team Phone # [phone] (Nights/Weekends)

## 2020-05-21 DIAGNOSIS — Z7189 Other specified counseling: Secondary | ICD-10-CM

## 2020-05-21 DIAGNOSIS — R531 Weakness: Secondary | ICD-10-CM

## 2020-05-21 LAB — CBC
HCT: 32.9 % — ABNORMAL LOW (ref 36.0–46.0)
Hemoglobin: 10.9 g/dL — ABNORMAL LOW (ref 12.0–15.0)
MCH: 31 pg (ref 26.0–34.0)
MCHC: 33.1 g/dL (ref 30.0–36.0)
MCV: 93.5 fL (ref 80.0–100.0)
Platelets: 230 10*3/uL (ref 150–400)
RBC: 3.52 MIL/uL — ABNORMAL LOW (ref 3.87–5.11)
RDW: 14.2 % (ref 11.5–15.5)
WBC: 8 10*3/uL (ref 4.0–10.5)
nRBC: 0 % (ref 0.0–0.2)

## 2020-05-21 LAB — BASIC METABOLIC PANEL
Anion gap: 11 (ref 5–15)
BUN: 32 mg/dL — ABNORMAL HIGH (ref 8–23)
CO2: 26 mmol/L (ref 22–32)
Calcium: 8.4 mg/dL — ABNORMAL LOW (ref 8.9–10.3)
Chloride: 92 mmol/L — ABNORMAL LOW (ref 98–111)
Creatinine, Ser: 1.16 mg/dL — ABNORMAL HIGH (ref 0.44–1.00)
GFR, Estimated: 45 mL/min — ABNORMAL LOW (ref >60)
Glucose, Bld: 111 mg/dL — ABNORMAL HIGH (ref 70–99)
Potassium: 4 mmol/L (ref 3.5–5.1)
Sodium: 129 mmol/L — ABNORMAL LOW (ref 135–145)

## 2020-05-21 MED ORDER — INFLUENZA VAC A&B SA ADJ QUAD 0.5 ML IM PRSY: 0.5000 mL | PREFILLED_SYRINGE | INTRAMUSCULAR | Status: DC

## 2020-05-21 MED ORDER — RISPERIDONE 0.5 MG PO TBDP: 0.5000 mg | ORAL_TABLET | Freq: Two times a day (BID) | ORAL | Status: AC

## 2020-05-21 NOTE — Progress Notes (Signed)
Palliative:  HPI: 86 y.o. female  with past medical history of hypertension, hyperlipidemia, non-Hodgkin's lymphoma 2004, colon cancer s/p resection 2007, right breast cancer s/p lumpectomy 2017 on tamoxifen, sacral fracture s/p sacral plasty June 2021, s/p kyphoplasty T9 T10 admitted on 05/18/2020 with cough and acute diastolic CHF, delirium.   I met today at Ms. Danh's bedside. No family present. She is alert and somewhat oriented. She was able to agree for plan for short term rehab (although does express she would rather go home). She agrees for rehab if this is what is felt she needs. She does confirm that she could not allow caregivers into her home as her son told me. Overall she is doing well for now.  Complicated situation as she could be nearing need for hospice assistance at home but due to hoarding no caregivers or persons desired to come into home. May need long term care. Consider hospice facility when appropriate but not a candidate at this time.  Exam: Ill-appearing. Frail, elderly. Awake and oriented to person and somewhat to situation. No distress. Breathing regular, unlabored. Appetite is good.   Plan: - SNF rehab with palliative follow up recommended.   15 min  Vinie Sill, NP Palliative Medicine Team Pager [phone] (Please see [email or web address] for schedule) Team Phone [phone]    Greater than 50%  of this time was spent counseling and coordinating care related to the above assessment and plan

## 2020-05-21 NOTE — TOC Transition Note (Signed)
Transition of Care [facility]) - CM/SW Discharge Note   Patient Details  Name: Janice [name] MRN: [id number] Date of Birth: 11-May-1929  Transition of Care [facility]) CM/SW Contact:  Shelbie Hutching, RN Phone Number: 05/21/2020, 12:40 PM   Clinical Narrative:     Patient is medically cleared for discharge to SNF at [facility] in [place].  RNCM attempted to call son with no answer and voicemail is not set up.  Patient has a bed at [facility], insurance authorization is approved from today 11/2-11/4Everlene Balls ID [id number].    1254:  Patient's son returned call and is updated on plan of care to discharge to [facility].  Son does have questions about what the cardiology recommendations are, MD messaged to update son.  \  Bedside RN will call report to [phone].  RNCM will arrange EMS transport once discharge has been completed.   Final next level of care: Skilled Nursing Facility Barriers to Discharge: Barriers Resolved   Patient Goals and CMS Choice   CMS Medicare.gov Compare Post Acute Care list provided to:: Patient Represenative (must comment) Choice offered to / list presented to : Adult Children  Discharge Placement                       Discharge Plan and Services   Discharge Planning Services: CM Consult Post Acute Care Choice: [facility]            DME Agency: NA       HH Arranged: NA          Social Determinants of Health (SDOH) Interventions     Readmission Risk Interventions No flowsheet data found.

## 2020-05-21 NOTE — NC FL2 (Signed)
[place] LEVEL OF CARE SCREENING TOOL     IDENTIFICATION  Patient Name: Janice [name] Birthdate: [birth year] Sex: female Admission Date (Current Location): 05/18/2020  [place] and [place] Number:  Engineering geologist and Address:  [facility], [address], [place], [place] [address]      Provider Number: [id number]  Attending Physician Name and Address:  Max Sane, MD  Relative Name and Phone Number:  Maryetta Shafer [phone]    Current Level of Care: Hospital Recommended Level of Care: [place] Prior Approval Number:    Date Approved/Denied:   PASRR Number: [id number]  Discharge Plan: SNF    Current Diagnoses: Patient Active Problem List   Diagnosis Date Noted  . Goals of care, counseling/discussion   . Palliative care by specialist   . Generalized weakness 05/19/2020  . Acute CHF (congestive heart failure) ([place]) 05/19/2020  . Acute respiratory failure with hypoxia ([place]) 05/18/2020  . Abnormal bone marrow examination 04/17/2020  . Hypoxia   . Closed sacral fracture ([place]) 01/02/2020  . Inadequate pain control   . Breast mass, right   . Ductal carcinoma in situ (DCIS) of right breast 03/26/2016  . History of non-Hodgkin's lymphoma 07/19/2015  . Bronchitis 11/23/2013  . History of colon cancer 11/23/2013  . HTN (hypertension) 11/23/2013  . Hypercholesterolemia 11/23/2013    Orientation RESPIRATION BLADDER Height & Weight     Self, Place  Normal (4.5L [place]) Continent Weight: 68.3 kg Height:  5\' 4"  (162.6 cm)  BEHAVIORAL SYMPTOMS/MOOD NEUROLOGICAL BOWEL NUTRITION STATUS      Incontinent Diet (Heart healthy)  AMBULATORY STATUS COMMUNICATION OF NEEDS Skin   Limited Assist Verbally Normal                       Personal Care Assistance Level of Assistance  Bathing, Feeding, Dressing Bathing Assistance: Limited assistance Feeding assistance: Limited assistance Dressing Assistance: Limited  assistance Total Care Assistance: Maximum assistance   Functional Limitations Info             SPECIAL CARE FACTORS FREQUENCY  PT (By licensed PT), OT (By licensed OT)     PT Frequency: 5 times per week OT Frequency: 5 time per week            Contractures Contractures Info: Not present    Additional Factors Info  Code Status, Allergies Code Status Info: DNR Allergies Info: NKA           Current Medications (05/21/2020):  This is the current hospital active medication list Current Facility-Administered Medications  Medication Dose Route Frequency Provider Last Rate Last Admin  . acetaminophen (TYLENOL) tablet 650 mg  650 mg Oral Q6H PRN Lenore Cordia, MD   650 mg at 05/20/20 2024   Or  . acetaminophen (TYLENOL) suppository 650 mg  650 mg Rectal Q6H PRN Zada Finders R, MD      . amLODipine (NORVASC) tablet 10 mg  10 mg Oral Daily Lenore Cordia, MD   10 mg at 05/21/20 3710  . aspirin EC tablet 81 mg  81 mg Oral Daily Bonnielee Haff, MD   81 mg at 05/21/20 6269  . atorvastatin (LIPITOR) tablet 10 mg  10 mg Oral q1800 Lenore Cordia, MD   10 mg at 05/20/20 1700  . enoxaparin (LOVENOX) injection 40 mg  40 mg Subcutaneous Q24H Oswald Hillock, RPH   40 mg at 05/21/20 4854  . haloperidol lactate (HALDOL) injection 1  mg  1 mg Intravenous Q6H PRN Max Sane, MD   1 mg at 05/20/20 0829  . [START ON 05/22/2020] influenza vaccine adjuvanted (FLUAD) injection 0.5 mL  0.5 mL Intramuscular Tomorrow-1000 Manuella Ghazi, Vipul, MD      . ipratropium-albuterol (DUONEB) 0.5-2.5 (3) MG/3ML nebulizer solution 3 mL  3 mL Nebulization BID Manuella Ghazi, Vipul, MD      . lisinopril (ZESTRIL) tablet 20 mg  20 mg Oral Daily Lenore Cordia, MD   20 mg at 05/21/20 8185  . polyethylene glycol (MIRALAX / GLYCOLAX) packet 17 g  17 g Oral Daily PRN Bonnielee Haff, MD      . risperiDONE (RISPERDAL M-TABS) disintegrating tablet 0.5 mg  0.5 mg Oral BID Max Sane, MD   0.5 mg at 05/21/20 6314  . tamoxifen  (NOLVADEX) tablet 20 mg  20 mg Oral Daily Lenore Cordia, MD   20 mg at 05/21/20 9702     Discharge Medications: Please see discharge summary for a list of discharge medications.  Relevant Imaging Results:  Relevant Lab Results:   Additional Information SS# [id number]  Shelbie Hutching, RN

## 2020-05-21 NOTE — Discharge Summary (Signed)
[facility] [place] NAME: Janice [name]    MR#:  [id number]  DATE OF BIRTH:  [birth year]  DATE OF ADMISSION:  05/18/2020   ADMITTING PHYSICIAN: Bonnielee Haff, MD  DATE OF DISCHARGE: 05/21/2020  PRIMARY CARE PHYSICIAN: Ezequiel Kayser, MD   ADMISSION DIAGNOSIS:  Cough [R05.9] Hypoxia [R09.02] Acute CHF (congestive heart failure) (HCC) [I50.9] Acute respiratory failure with hypoxia (HCC) [J96.01] Congestive heart failure, unspecified HF chronicity, unspecified heart failure type ([place]) [I50.9] DISCHARGE DIAGNOSIS:  Principal Problem:   Acute respiratory failure with hypoxia (HCC) Active Problems:   Ductal carcinoma in situ (DCIS) of right breast   HTN (hypertension)   Hypercholesterolemia   Generalized weakness   Acute CHF (congestive heart failure) (HCC)   Goals of care, counseling/discussion   Palliative care by specialist  SECONDARY DIAGNOSIS:   Past Medical History:  Diagnosis Date  . Breast cancer ([place]) 2017   DCIS of right breast  . Colon cancer ([place]) 2003   adenocarcinoma of the ascending colon, 2007  . Environmental allergies   . High cholesterol   . Hypertension   . Large cell lymphoma ([place]) 2007   Diffuse large cell lymphoma, completed CHOP and Rituxan in March 2004  . Non Hodgkin's lymphoma ([place]) 2003  . Personal history of chemotherapy 2003   Non Hodgkins  . Skin cancer    HOSPITAL COURSE:  86 year old female with a history of hypertension, hyperlipidemia, non-Hodgkin's lymphoma status post R-CHOP, colon cancer status post resection, right breast DCIS status post right lumpectomy on tamoxifen, sacral fracture status post sacral plasty, kyphoplasty is admitted for shortness of breath.  Acute diastolic CHF/acute hypoxic respiratory failure Present on admission Diuresed well  Acute delirium Improved with stopping steroids and benzos.  Responded well to Risperdal  Paroxysmal A. Fib Heart rate well controlled.  Not a  candidate for anticoagulation due to high risk for fall  Hyponatremia and hypokalemia Repleted and resolved  Generalized weakness/physical deconditioning with frequent falls/failure to thrive This will be an ongoing challenge -needs palliative care to follow her while at the facility with consideration of transition to hospice if and when she qualifies and appropriate  Essential hypertension Continue amlodipine and lisinopril  Non-Hodgkin's lymphoma Follow-up with Dr. Mike Gip  Hyperlipidemia I am stopping her statin as the risk seem to outweigh benefit at her age and with frequent falls  Normocytic anemia Stable H&H   DISCHARGE CONDITIONS:  Fair CONSULTS OBTAINED:   DRUG ALLERGIES:  No Known Allergies DISCHARGE MEDICATIONS:   Allergies as of 05/21/2020   No Known Allergies     Medication List    STOP taking these medications   atorvastatin 10 MG tablet Commonly known as: LIPITOR   HYDROcodone-acetaminophen 5-325 MG tablet Commonly known as: NORCO/VICODIN   senna 8.6 MG Tabs tablet Commonly known as: SENOKOT     TAKE these medications   acetaminophen 500 MG tablet Commonly known as: TYLENOL Take 500 mg by mouth every 6 (six) hours as needed.   alendronate 70 MG tablet Commonly known as: FOSAMAX Take 70 mg by mouth once a week.   amLODipine 10 MG tablet Commonly known as: NORVASC Take 10 mg by mouth daily.   aspirin 81 MG tablet Take 81 mg by mouth daily.   lisinopril 20 MG tablet Commonly known as: ZESTRIL Take 20 mg by mouth daily.   NYQUIL PO Take 30 mLs by mouth daily as needed (cough).   polyethylene glycol 17 g  packet Commonly known as: MIRALAX / GLYCOLAX Take 17 g by mouth daily as needed for mild constipation.   risperiDONE 0.5 MG disintegrating tablet Commonly known as: RISPERDAL M-TABS Take 1 tablet (0.5 mg total) by mouth 2 (two) times daily.   tamoxifen 20 MG tablet Commonly known as: NOLVADEX Take 1 tablet (20 mg total) by mouth  daily. Patient MUST make an appointment to be seen before another refill will be approved   Vitamin D (Ergocalciferol) 1.25 MG (50000 UNIT) Caps capsule Commonly known as: DRISDOL Take 50,000 Units by mouth every 7 (seven) days.      DISCHARGE INSTRUCTIONS:   DIET:  Cardiac diet DISCHARGE CONDITION:  Fair ACTIVITY:  Activity as tolerated OXYGEN:  Home Oxygen: Yes.    Oxygen Delivery: 2-3 liters/min via Patient connected to nasal cannula oxygen DISCHARGE LOCATION:  nursing home with palliative care to follow.  Strongly recommend transition to hospice if and when she qualifies  If you experience worsening of your admission symptoms, develop shortness of breath, life threatening emergency, suicidal or homicidal thoughts you must seek medical attention immediately by calling 911 or calling your MD immediately  if symptoms less severe.  You Must read complete instructions/literature along with all the possible adverse reactions/side effects for all the Medicines you take and that have been prescribed to you. Take any new Medicines after you have completely understood and accpet all the possible adverse reactions/side effects.   Please note  You were cared for by a hospitalist during your hospital stay. If you have any questions about your discharge medications or the care you received while you were in the hospital after you are discharged, you can call the unit and asked to speak with the hospitalist on call if the hospitalist that took care of you is not available. Once you are discharged, your primary care physician will handle any further medical issues. Please note that NO REFILLS for any discharge medications will be authorized once you are discharged, as it is imperative that you return to your primary care physician (or establish a relationship with a primary care physician if you do not have one) for your aftercare needs so that they can reassess your need for medications and monitor  your lab values.    On the day of Discharge:  VITAL SIGNS:  Blood pressure (!) 115/59, pulse 93, temperature 98.1 F (36.7 C), temperature source Oral, resp. rate 16, height 5\' 4"  (1.626 m), weight 68.3 kg, SpO2 94 %. PHYSICAL EXAMINATION:  GENERAL:  86 y.o.-year-old patient lying in the bed with no acute distress.  EYES: Pupils equal, round, reactive to light and accommodation. No scleral icterus. Extraocular muscles intact.  HEENT: Head atraumatic, normocephalic. Oropharynx and nasopharynx clear.  NECK:  Supple, no jugular venous distention. No thyroid enlargement, no tenderness.  LUNGS: Normal breath sounds bilaterally, no wheezing, rales,rhonchi or crepitation. No use of accessory muscles of respiration.  CARDIOVASCULAR: S1, S2 normal. No murmurs, rubs, or gallops.  ABDOMEN: Soft, non-tender, non-distended. Bowel sounds present. No organomegaly or mass.  EXTREMITIES: No pedal edema, cyanosis, or clubbing.  NEUROLOGIC: Cranial nerves II through XII are intact. Muscle strength 5/5 in all extremities. Sensation intact. Gait not checked.  PSYCHIATRIC: The patient is alert and oriented x 3.  SKIN: No obvious rash, lesion, or ulcer.  DATA REVIEW:   CBC Recent Labs  Lab 05/21/20 0416  WBC 8.0  HGB 10.9*  HCT 32.9*  PLT 230    Chemistries  Recent Labs  Lab 05/18/20  1536 05/18/20 1536 05/19/20 0640 05/20/20 0549 05/21/20 0416  NA 136   < > 133*   < > 129*  K 3.9   < > 3.4*   < > 4.0  CL 99   < > 96*   < > 92*  CO2 26   < > 27   < > 26  GLUCOSE 124*   < > 102*   < > 111*  BUN 12   < > 12   < > 32*  CREATININE 0.86   < > 0.77   < > 1.16*  CALCIUM 8.8*   < > 8.5*   < > 8.4*  MG  --   --  2.1  --   --   AST 17  --   --   --   --   ALT 11  --   --   --   --   ALKPHOS 58  --   --   --   --   BILITOT 0.8  --   --   --   --    < > = values in this interval not displayed.     Outpatient follow-up  Contact information for follow-up providers    [facility] Follow up on 05/28/2020.   Specialty: Cardiology Why: at 12:30pm. Enter through the [place] entrance Contact information: [place] [phone]           Contact information for after-discharge care    [facility] .   Service: Skilled Nursing Contact information: [place] [phone]                   Management plans discussed with the patient, family and they are in agreement.  CODE STATUS: DNR   TOTAL TIME TAKING CARE OF THIS PATIENT: 45 minutes.    Max Sane M.D on 05/21/2020 at 12:19 PM  [facility]   CC: Primary care physician; Ezequiel Kayser, MD   Note: This dictation was prepared with Dragon dictation along with smaller phrase technology. Any transcriptional errors that result from this process are unintentional.

## 2020-05-21 NOTE — Progress Notes (Signed)
Physical Therapy Treatment Patient Details Name: MAYSIE PARKHILL MRN: [id number] DOB: March 15, 1929 Today's Date: 05/21/2020    History of Present Illness Pt is an 86 y.o. 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  MD assessment includes: Acute diastolic CHF/acute respiratory failure with hypoxia, A-fib, Hyponatremia/hypokalemia, HTN, and normocytic anemia.    PT Comments    Pt in bed upon entry, reports to feel poorly, perseverative on her current condition and recent course of illness/debility. Pt offered encouraging words. ModA to EOB, ModA to STS from EOB, then minA from elevated EOB with chair or RW. Pt able to remain standing 3x30wsec, but has significant Right thorax pain under Rt break when coming to standing. Pt able to pivot to chair. O2 turned down from 4 - 2L, sats remain 94%. Pt up to chair at EOS, diet cole on ice issued as requested, NA in room.    Follow Up Recommendations  SNF     Equipment Recommendations  None recommended by PT    Recommendations for Other Services       Precautions / Restrictions Precautions Precautions: Fall Restrictions Weight Bearing Restrictions: No    Mobility  Bed Mobility Overal bed mobility: Needs Assistance Bed Mobility: Supine to Sit     Supine to sit: Mod assist        Transfers Overall transfer level: Needs assistance Equipment used: Rolling walker (2 wheeled) Transfers: Sit to/from [employer] Transfers Sit to Stand: Min assist;From elevated surface Stand pivot transfers: Min assist          Ambulation/Gait                 Stairs             Wheelchair Mobility    Modified Rankin (Stroke Patients Only)        Balance                                            Cognition Arousal/Alertness: Awake/alert Behavior During Therapy: Anxious;WFL for tasks assessed/performed Overall Cognitive Status: Within Functional Limits for tasks assessed                                 General Comments: perseverative      Exercises Other Exercises Other Exercises: Dependent STS from EOB, ModA 1x3 Other Exercises: MinA STS from elevated EOB with chairback UE support 3x30sec Other Exercises: STS form EOB x2 c minA    General Comments        Pertinent Vitals/Pain Pain Assessment: No/denies pain    Home Living                      Prior Function            PT Goals (current goals can now be found in the care plan section) Acute Rehab PT Goals Patient Stated Goal: To get stronger PT Goal Formulation: With patient Time For Goal Achievement: 05/31/20 Potential to Achieve Goals: Fair Progress towards PT goals: Progressing toward goals    Frequency  Min 2X/week      PT Plan Current plan remains appropriate    Co-evaluation              AM-PAC PT "6 Clicks" Mobility   Outcome Measure  Help needed turning from your back to your side while in a flat bed without using bedrails?: A Little Help needed moving from lying on your back to sitting on the side of a flat bed without using bedrails?: A Little Help needed moving to and from a bed to a chair (including a wheelchair)?: A Little Help needed standing up from a chair using your arms (e.g., wheelchair or bedside chair)?: A Little Help needed to walk in hospital room?: A Lot Help needed climbing 3-5 steps with a railing? : Total 6 Click Score: 15    End of Session   Activity Tolerance: Patient tolerated treatment well;No increased pain Patient left: in bed;with call bell/phone within reach;Other (comment) Nurse Communication: Mobility status PT Visit Diagnosis: Unsteadiness on feet  (R26.81);Muscle weakness (generalized) (M62.81);Difficulty in walking, not elsewhere classified (R26.2)     Time: 1610-9604 PT Time Calculation (min) (ACUTE ONLY): 26 min  Charges:  $Therapeutic Exercise: 23-37 mins                     1:13 PM, 05/21/20 Etta Grandchild, PT, DPT Physical Therapist - [facility]  [phone] ([place])     Nolberto Cheuvront C 05/21/2020, 1:09 PM

## 2020-05-21 NOTE — TOC Progression Note (Signed)
Transition of Care [facility]) - Progression Note    Patient Details  Name: Janice [name] MRN: [id number] Date of Birth: 1929-04-08  Transition of Care [facility]) CM/SW Contact  Meriel Flavors, LCSW Phone Number: 05/21/2020, 8:30 AM  Clinical Narrative:    CSW called son, Vicente Serene to discuss discharge plan and recommendation for skilled nursing for rehab. Vicente Serene stated he would need to share info with other family members and call CSW Tuesday morning. CSW ask if it would be okay to go ahead with the SNF work-up and insurance authorization process as it can take several days and they always have the option to decline any offers. Vicente Serene agreed and said their "First choice" would be [facility] in [place]. CSW confirmed and made note.         Expected Discharge Plan and Services                                                 Social Determinants of Health (SDOH) Interventions    Readmission Risk Interventions No flowsheet data found.

## 2020-05-21 NOTE — Progress Notes (Signed)
Report called to [place], [place], and patient will be going to room 601.  Patient left the unit with EMS via stretcher.  AVS given to EMS transport.

## 2020-05-21 NOTE — TOC Transition Note (Signed)
Transition of Care [facility]) - CM/SW Discharge Note   Patient Details  Name: HEVIN JEFFCOAT MRN: [id number] Date of Birth: 02-Dec-1928  [facility]) CM/SW Contact:  Shelbie Hutching, RN Phone Number: 05/21/2020, 2:06 PM   Clinical Narrative:    [facility] transport has been arranged by this RNCM.  Patient is 2nd on the list for pickup.  Bedside RN calling report.    Final next level of care: Skilled Nursing Facility Barriers to Discharge: Barriers Resolved   Patient Goals and CMS Choice   CMS Medicare.gov Compare Post Acute Care list provided to:: Patient Represenative (must comment) Choice offered to / list presented to : Adult Children  Discharge Placement                       Discharge Plan and Services   Discharge Planning Services: CM Consult Post Acute Care Choice: [place]            DME Agency: NA       HH Arranged: NA          Social Determinants of Health (SDOH) Interventions     Readmission Risk Interventions No flowsheet data found.

## 2020-05-22 ENCOUNTER — Telehealth: Payer: Self-pay | Admitting: Family

## 2020-05-22 NOTE — Telephone Encounter (Signed)
Spoke to staff at [facility] who said patient is doing great since discharged to their facility. They are checking daily weights, shes taking her medications as prescribed, following a low sodium diet with no complaints from patient at this time. They confirmed her new patient CHF Clinic appointment for 11/9.   Lindberg Zenon, NT

## 2020-05-28 ENCOUNTER — Ambulatory Visit: Payer: Medicare Other | Attending: Family | Admitting: Family

## 2020-05-28 ENCOUNTER — Other Ambulatory Visit: Payer: Self-pay

## 2020-05-28 ENCOUNTER — Encounter: Payer: Self-pay | Admitting: Family

## 2020-05-28 VITALS — BP 140/77 | HR 82 | Resp 18 | Wt 174.0 lb

## 2020-05-28 DIAGNOSIS — R0902 Hypoxemia: Secondary | ICD-10-CM

## 2020-05-28 DIAGNOSIS — Z79899 Other long term (current) drug therapy: Secondary | ICD-10-CM | POA: Diagnosis not present

## 2020-05-28 DIAGNOSIS — Z7982 Long term (current) use of aspirin: Secondary | ICD-10-CM | POA: Diagnosis not present

## 2020-05-28 DIAGNOSIS — E7800 Pure hypercholesterolemia, unspecified: Secondary | ICD-10-CM | POA: Diagnosis not present

## 2020-05-28 DIAGNOSIS — I509 Heart failure, unspecified: Secondary | ICD-10-CM | POA: Diagnosis present

## 2020-05-28 DIAGNOSIS — I5032 Chronic diastolic (congestive) heart failure: Secondary | ICD-10-CM

## 2020-05-28 DIAGNOSIS — I110 Hypertensive heart disease with heart failure: Secondary | ICD-10-CM | POA: Insufficient documentation

## 2020-05-28 NOTE — Progress Notes (Signed)
   Subjective:    Patient ID: Janice [name], female    DOB: 09-07-1928, 86 y.o.   MRN: [id number]  HPI    Review of Systems     Objective:   Physical Exam    Prior to Admission medications   Medication Sig Start Date End Date Taking? Authorizing Provider  acetaminophen (TYLENOL) 500 MG tablet Take 500 mg by mouth every 6 (six) hours as needed.    [provider]  alendronate (FOSAMAX) 70 MG tablet Take 70 mg by mouth once a week.  01/06/19   [provider]  amLODipine (NORVASC) 10 MG tablet Take 10 mg by mouth daily.  01/06/19   [provider]  aspirin 81 MG tablet Take 81 mg by mouth daily.    [provider]  lisinopril (PRINIVIL,ZESTRIL) 20 MG tablet Take 20 mg by mouth daily.    [provider]  polyethylene glycol (MIRALAX / GLYCOLAX) 17 g packet Take 17 g by mouth daily as needed for mild constipation. 01/05/20   [name], MD  Pseudoeph-Doxylamine-DM-APAP (NYQUIL PO) Take 30 mLs by mouth daily as needed (cough).    [provider]  risperiDONE (RISPERDAL M-TABS) 0.5 MG disintegrating tablet Take 1 tablet (0.5 mg total) by mouth 2 (two) times daily. 05/21/20   Max Sane, MD  tamoxifen (NOLVADEX) 20 MG tablet Take 1 tablet (20 mg total) by mouth daily. Patient MUST make an appointment to be seen before another refill will be approved 04/22/20   Lequita Asal, MD  Vitamin D, Ergocalciferol, (DRISDOL) 1.25 MG (50000 UT) CAPS capsule Take 50,000 Units by mouth every 7 (seven) days.  01/06/19   [provider]     Lab Results  Component Value Date   CREATININE 1.16 (H) 05/21/2020   CREATININE 0.80 05/20/2020   CREATININE 0.77 05/19/2020   Today's Vitals   05/28/20 1234  BP: 140/77  Pulse: 82  SpO2: 95%  Weight: 174 lb (78.9 kg)   Body mass index is 29.87 kg/m.       Assessment & Plan:

## 2020-05-28 NOTE — Progress Notes (Signed)
Subjective:    Patient ID: Janice [name], female    DOB: 15-May-1929, 86 y.o.   MRN: [id number]  HPI  Janice [name] is a 86 year old caucasian female who presents to the clinic today as a new patient. Patient has a pertinent hx of HTN, Acute CHF, Hypoxia, and multiple falls.   Patient was recently seen in the ER and hospitalized on 05/18/20 for shortness of breath  and discharged on 05/21/20.   Patient had an echocardiogram 05/19/20 that showed EF of 50-55%. This is decreased from echo on 01/05/20 which showed an EF of 60-65%.  Patient reports today that she has been having shortness of breath for months and when it worsened she was admitted. Patient reports increased fatigue, sleep disturbance, and lethargy. Patient reports she was placed on oxygen in the hospital and has been on it since. Patient reports she does not feel like she has a "swelling or fluid" problem. Patient is repetitious when speaking stating "They said my heart was fine. Now i'm being put through all this. I just want to go home." Patient is a poor historian and son reports his brother is normally the one who attends appointments with his mother.   Past Medical History:  Diagnosis Date  . Breast cancer ([place]) 2017   DCIS of right breast  . Colon cancer ([place]) 2003   adenocarcinoma of the ascending colon, 2007  . Environmental allergies   . High cholesterol   . Hypertension   . Large cell lymphoma ([place]) 2007   Diffuse large cell lymphoma, completed CHOP and Rituxan in March 2004  . Non Hodgkin's lymphoma ([place]) 2003  . Personal history of chemotherapy 2003   Non Hodgkins  . Skin cancer    Past Surgical History:  Procedure Laterality Date  . BREAST BIOPSY Right 03/18/2016   DCIS  . BREAST BIOPSY Right 08/19/2017   ribbon marker, Fibrocystic changes- neg  . BREAST LUMPECTOMY Right 04/24/2016   DCIS clear margins. SN biopsy-negative. Declined rad tx.   Marland Kitchen BREAST LUMPECTOMY WITH NEEDLE LOCALIZATION Right 04/24/2016    Procedure: BREAST LUMPECTOMY WITH NEEDLE LOCALIZATION;  Surgeon: Jules Husbands, MD;  Location: [facility] ORS;  Service: General;  Laterality: Right;  . COLON SURGERY  11/24/05   Cancer surgery  . COLONOSCOPY     2012  . COLONOSCOPY N/A 08/28/2015   Procedure: COLONOSCOPY;  Surgeon: Hulen Luster, MD;  Location: [place];  Service: Gastroenterology;  Laterality: N/A;  . DIAGNOSTIC MAMMOGRAM     May 2014  . KYPHOPLASTY N/A 03/28/2020   Procedure: T9 AND T12 KYPHOPLASTY;  Surgeon: Hessie Knows, MD;  Location: [facility] ORS;  Service: Orthopedics;  Laterality: N/A;  . LYMPH NODE DISSECTION  2003   neck - non-hodgkin's lymphoma  . SACROPLASTY N/A 01/04/2020   Procedure: SACROPLASTY;  Surgeon: Hessie Knows, MD;  Location: [facility] ORS;  Service: Orthopedics;  Laterality: N/A;  . SENTINEL NODE BIOPSY Right 04/24/2016   Procedure: SENTINEL NODE BIOPSY;  Surgeon: Jules Husbands, MD;  Location: [facility] ORS;  Service: General;  Laterality: Right;   Family History  Problem Relation Age of Onset  . Breast cancer Sister 70       half sister  . Cancer Other        family history of colon cancer, breast cancer, lung cancer  . Cancer Paternal Uncle        colon  . Colon cancer Maternal Uncle   . Stomach cancer Maternal Grandmother   .  Heart attack Mother 39  . Hypertension Mother   . Other Father        "old age"   Social History   Tobacco Use  . Smoking status: Never Smoker  . Smokeless tobacco: Never Used  Substance Use Topics  . Alcohol use: No   No Known Allergies Prior to Admission medications   Medication Sig Start Date End Date Taking? Authorizing Provider  acetaminophen (TYLENOL) 500 MG tablet Take 500 mg by mouth every 6 (six) hours as needed.    [provider]  alendronate (FOSAMAX) 70 MG tablet Take 70 mg by mouth once a week.  01/06/19   [provider]  amLODipine (NORVASC) 10 MG tablet Take 10 mg by mouth daily.  01/06/19   [provider]  aspirin 81 MG tablet  Take 81 mg by mouth daily.    [provider]  lisinopril (PRINIVIL,ZESTRIL) 20 MG tablet Take 20 mg by mouth daily.    [provider]  polyethylene glycol (MIRALAX / GLYCOLAX) 17 g packet Take 17 g by mouth daily as needed for mild constipation. 01/05/20   [name], MD  Pseudoeph-Doxylamine-DM-APAP (NYQUIL PO) Take 30 mLs by mouth daily as needed (cough).    [provider]  risperiDONE (RISPERDAL M-TABS) 0.5 MG disintegrating tablet Take 1 tablet (0.5 mg total) by mouth 2 (two) times daily. 05/21/20   Max Sane, MD  tamoxifen (NOLVADEX) 20 MG tablet Take 1 tablet (20 mg total) by mouth daily. Patient MUST make an appointment to be seen before another refill will be approved 04/22/20   Lequita Asal, MD  Vitamin D, Ergocalciferol, (DRISDOL) 1.25 MG (50000 UT) CAPS capsule Take 50,000 Units by mouth every 7 (seven) days.  01/06/19   [provider]    Review of Systems  Constitutional: Negative for activity change and appetite change.  HENT: Negative for congestion, postnasal drip and sore throat.   Eyes: Negative.   Respiratory: Positive for shortness of breath (With exertion). Negative for cough, choking, chest tightness, wheezing and stridor.   Cardiovascular: Positive for leg swelling. Negative for chest pain and palpitations.  Gastrointestinal: Negative for abdominal distention, abdominal pain, constipation, diarrhea, nausea and vomiting.  Endocrine: Negative.   Genitourinary: Negative.   Musculoskeletal: Positive for arthralgias (From falls, elbow and legs), back pain and gait problem.  Skin: Negative.   Allergic/Immunologic: Negative.   Neurological: Positive for weakness. Negative for dizziness, light-headedness and numbness.  Hematological: Negative for adenopathy. Bruises/bleeds easily.  Psychiatric/Behavioral: Positive for confusion (Repetative comments) and sleep disturbance (Sleeping on 2 pillows). Negative for agitation,  hallucinations and suicidal ideas.       Objective:   Physical Exam Constitutional:      General: She is not in acute distress.    Appearance: Normal appearance. She is not ill-appearing, toxic-appearing or diaphoretic.  Cardiovascular:     Rate and Rhythm: Normal rate. Rhythm irregular.  Pulmonary:     Effort: Pulmonary effort is normal. No respiratory distress.     Breath sounds: No wheezing, rhonchi or rales.  Abdominal:     General: Abdomen is flat. There is no distension.     Palpations: Abdomen is soft.     Tenderness: There is no rebound.  Musculoskeletal:        General: Tenderness and signs of injury (Bruises to right shin from fall) present. No deformity.     Right lower leg: Edema (Trace swelling) present.     Left lower leg: Edema (Trace Swelling)  present.  Neurological:     Mental Status: She is alert.     Motor: Weakness present.     Lab Results  Component Value Date   CREATININE 1.16 (H) 05/21/2020   CREATININE 0.80 05/20/2020   CREATININE 0.77 05/19/2020   Vitals with BMI 05/28/2020 05/21/2020 05/21/2020  Height - - -  Weight 174 lbs - -  BMI 83.37 - -  Systolic 445 146 047  Diastolic 77 59 70  Pulse 82 93 78        Assessment & Plan:   1: Chronic Heart Failure with preserved ejection fraction- - NYHA Class II -Euvolemic today with trace edema in bilateral legs -Not weighing daily currently, encouraged patient to weigh daily when discharged from [facility] family reports they have scales at home -Explained that if patient gains more than 2lbs overnight or 5lbs in a week to call -Showed family member how to check for swelling in the legs and what this looks like -Patient reports she adds salt to food and like to eat it on eggs and tomato sandwiches -Encouraged patient to make salt-conscience food choices and try to follow DASH diet.  -Patients son reports he is concerned at the idea of trying to manage sodium intake as there "is  salt in everything." Encouraged him to read food labels and make the best effort they can -Patient reports she does not know how much water she drinks daily -BMP on 05/21/20 showed Sodium 129, Potassium 4.0, Creatinine 1.16, and GFR 45 -BNP on 05/18/20 showed 531.3, increased from 01/04/20 when it was 300.3  2: Hypoxia: - Patient is currently on 2L [place] -Son is unsure how long she needs to be on oxygen -Saw PCP Raechel Ache) 01/29/20 -Saw Cardiology ([place]) in office on 01/16/20  -Patient is currently getting PT at [facility] and reportedly is set to be released on 06/05/20 -Patient to go back to living with her sons.    Medication list reviewed with family and patient. Unable to verify specific medications. Encouraged family to bring lists to appointments.   Follow up PRN and call for worsening fluid retention.

## 2020-05-28 NOTE — Patient Instructions (Signed)
DASH Eating Plan °DASH stands for "Dietary Approaches to Stop Hypertension." The DASH eating plan is a healthy eating plan that has been shown to reduce high blood pressure (hypertension). It may also reduce your risk for type 2 diabetes, heart disease, and stroke. The DASH eating plan may also help with weight loss. °What are tips for following this plan? ° °General guidelines °· Avoid eating more than 2,300 mg (milligrams) of salt (sodium) a day. If you have hypertension, you may need to reduce your sodium intake to 1,500 mg a day. °· Limit alcohol intake to no more than 1 drink a day for nonpregnant women and 2 drinks a day for men. One drink equals 12 oz of beer, 5 oz of wine, or 1½ oz of hard liquor. °· Work with your health care provider to maintain a healthy body weight or to lose weight. Ask what an ideal weight is for you. °· Get at least 30 minutes of exercise that causes your heart to beat faster (aerobic exercise) most days of the week. Activities may include walking, swimming, or biking. °· Work with your health care provider or diet and nutrition specialist (dietitian) to adjust your eating plan to your individual calorie needs. °Reading food labels ° °· Check food labels for the amount of sodium per serving. Choose foods with less than 5 percent of the Daily Value of sodium. Generally, foods with less than 300 mg of sodium per serving fit into this eating plan. °· To find whole grains, look for the word "whole" as the first word in the ingredient list. °Shopping °· Buy products labeled as "low-sodium" or "no salt added." °· Buy fresh foods. Avoid canned foods and premade or frozen meals. °Cooking °· Avoid adding salt when cooking. Use salt-free seasonings or herbs instead of table salt or sea salt. Check with your health care provider or pharmacist before using salt substitutes. °· Do not fry foods. Cook foods using healthy methods such as baking, boiling, grilling, and broiling instead. °· Cook with  heart-healthy oils, such as olive, canola, soybean, or sunflower oil. °Meal planning °· Eat a balanced diet that includes: °? 5 or more servings of fruits and vegetables each day. At each meal, try to fill half of your plate with fruits and vegetables. °? Up to 6-8 servings of whole grains each day. °? Less than 6 oz of lean meat, poultry, or fish each day. A 3-oz serving of meat is about the same size as a deck of cards. One egg equals 1 oz. °? 2 servings of low-fat dairy each day. °? A serving of nuts, seeds, or beans 5 times each week. °? Heart-healthy fats. Healthy fats called Omega-3 fatty acids are found in foods such as flaxseeds and coldwater fish, like sardines, salmon, and mackerel. °· Limit how much you eat of the following: °? Canned or prepackaged foods. °? Food that is high in trans fat, such as fried foods. °? Food that is high in saturated fat, such as fatty meat. °? Sweets, desserts, sugary drinks, and other foods with added sugar. °? Full-fat dairy products. °· Do not salt foods before eating. °· Try to eat at least 2 vegetarian meals each week. °· Eat more home-cooked food and less restaurant, buffet, and fast food. °· When eating at a restaurant, ask that your food be prepared with less salt or no salt, if possible. °What foods are recommended? °The items listed may not be a complete list. Talk with your dietitian about   what dietary choices are best for you. °Grains °Whole-grain or whole-wheat bread. Whole-grain or whole-wheat pasta. Brown rice. Oatmeal. Quinoa. Bulgur. Whole-grain and low-sodium cereals. Pita bread. Low-fat, low-sodium crackers. Whole-wheat flour tortillas. °Vegetables °Fresh or frozen vegetables (raw, steamed, roasted, or grilled). Low-sodium or reduced-sodium tomato and vegetable juice. Low-sodium or reduced-sodium tomato sauce and tomato paste. Low-sodium or reduced-sodium canned vegetables. °Fruits °All fresh, dried, or frozen fruit. Canned fruit in natural juice (without  added sugar). °Meat and other protein foods °Skinless chicken or turkey. Ground chicken or turkey. Pork with fat trimmed off. Fish and seafood. Egg whites. Dried beans, peas, or lentils. Unsalted nuts, nut butters, and seeds. Unsalted canned beans. Lean cuts of beef with fat trimmed off. Low-sodium, lean deli meat. °Dairy °Low-fat (1%) or fat-free (skim) milk. Fat-free, low-fat, or reduced-fat cheeses. Nonfat, low-sodium ricotta or cottage cheese. Low-fat or nonfat yogurt. Low-fat, low-sodium cheese. °Fats and oils °Soft margarine without trans fats. Vegetable oil. Low-fat, reduced-fat, or light mayonnaise and salad dressings (reduced-sodium). Canola, safflower, olive, soybean, and sunflower oils. Avocado. °Seasoning and other foods °Herbs. Spices. Seasoning mixes without salt. Unsalted popcorn and pretzels. Fat-free sweets. °What foods are not recommended? °The items listed may not be a complete list. Talk with your dietitian about what dietary choices are best for you. °Grains °Baked goods made with fat, such as croissants, muffins, or some breads. Dry pasta or rice meal packs. °Vegetables °Creamed or fried vegetables. Vegetables in a cheese sauce. Regular canned vegetables (not low-sodium or reduced-sodium). Regular canned tomato sauce and paste (not low-sodium or reduced-sodium). Regular tomato and vegetable juice (not low-sodium or reduced-sodium). Pickles. Olives. °Fruits °Canned fruit in a light or heavy syrup. Fried fruit. Fruit in cream or butter sauce. °Meat and other protein foods °Fatty cuts of meat. Ribs. Fried meat. Bacon. Sausage. Bologna and other processed lunch meats. Salami. Fatback. Hotdogs. Bratwurst. Salted nuts and seeds. Canned beans with added salt. Canned or smoked fish. Whole eggs or egg yolks. Chicken or turkey with skin. °Dairy °Whole or 2% milk, cream, and half-and-half. Whole or full-fat cream cheese. Whole-fat or sweetened yogurt. Full-fat cheese. Nondairy creamers. Whipped toppings.  Processed cheese and cheese spreads. °Fats and oils °Butter. Stick margarine. Lard. Shortening. Ghee. Bacon fat. Tropical oils, such as coconut, palm kernel, or palm oil. °Seasoning and other foods °Salted popcorn and pretzels. Onion salt, garlic salt, seasoned salt, table salt, and sea salt. Worcestershire sauce. Tartar sauce. Barbecue sauce. Teriyaki sauce. Soy sauce, including reduced-sodium. Steak sauce. Canned and packaged gravies. Fish sauce. Oyster sauce. Cocktail sauce. Horseradish that you find on the shelf. Ketchup. Mustard. Meat flavorings and tenderizers. Bouillon cubes. Hot sauce and Tabasco sauce. Premade or packaged marinades. Premade or packaged taco seasonings. Relishes. Regular salad dressings. °Where to find more information: °· National Heart, Lung, and Blood Institute: www.nhlbi.nih.gov °· American Heart Association: www.heart.org °Summary °· The DASH eating plan is a healthy eating plan that has been shown to reduce high blood pressure (hypertension). It may also reduce your risk for type 2 diabetes, heart disease, and stroke. °· With the DASH eating plan, you should limit salt (sodium) intake to 2,300 mg a day. If you have hypertension, you may need to reduce your sodium intake to 1,500 mg a day. °· When on the DASH eating plan, aim to eat more fresh fruits and vegetables, whole grains, lean proteins, low-fat dairy, and heart-healthy fats. °· Work with your health care provider or diet and nutrition specialist (dietitian) to adjust your eating plan to your   individual calorie needs. This information is not intended to replace advice given to you by your health care provider. Make sure you discuss any questions you have with your health care provider. Document Revised: 06/18/2017 Document Reviewed: 06/29/2016 Elsevier Patient Education  [place].   Heart Failure, Diagnosis  Heart failure means that your heart is not able to pump blood in the right [place]. This makes it hard for  your body to work well. Heart failure is usually a long-term (chronic) condition. You must take good care of yourself and follow your treatment plan from your doctor. What are the causes? This condition may be caused by:  High blood pressure.  Build up of cholesterol and fat in the arteries.  Heart attack. This injures the heart muscle.  Heart valves that do not open and close properly.  Damage of the heart muscle. This is also called cardiomyopathy.  Lung disease.  Abnormal heart rhythms. What increases the risk? The risk of heart failure goes up as a person ages. This condition is also more likely to develop in people who:  Are overweight.  Are female.  Smoke or chew tobacco.  Abuse alcohol or illegal drugs.  Have taken medicines that can damage the heart.  Have diabetes.  Have abnormal heart rhythms.  Have thyroid problems.  Have low blood counts (anemia). What are the signs or symptoms? Symptoms of this condition include:  Shortness of breath.  Coughing.  Swelling of the feet, ankles, legs, or belly.  Losing weight for no reason.  Trouble breathing.  Waking from sleep because of the need to sit up and get more air.  Rapid heartbeat.  Being very tired.  Feeling dizzy, or feeling like you may pass out (faint).  Having no desire to eat.  Feeling like you may vomit (nauseous).  Peeing (urinating) more at night.  Feeling confused. How is this treated?     This condition may be treated with:  Medicines. These can be given to treat blood pressure and to make the heart muscles stronger.  Changes in your daily life. These may include eating a healthy diet, staying at a healthy body weight, quitting tobacco and illegal drug use, or doing exercises.  Surgery. Surgery can be done to open blocked valves, or to put devices in the heart, such as pacemakers.  A donor heart (heart transplant). You will receive a healthy heart from a donor. Follow these  instructions at home:  Treat other conditions as told by your doctor. These may include high blood pressure, diabetes, thyroid disease, or abnormal heart rhythms.  Learn as much as you can about heart failure.  Get support as you need it.  Keep all follow-up visits as told by your doctor. This is important. Summary  Heart failure means that your heart is not able to pump blood in the right [place].  This condition is caused by high blood pressure, heart attack, or damage of the heart muscle.  Symptoms of this condition include shortness of breath and swelling of the feet, ankles, legs, or belly. You may also feel very tired or feel like you may vomit.  You may be treated with medicines, surgery, or changes in your daily life.  Treat other health conditions as told by your doctor. This information is not intended to replace advice given to you by your health care provider. Make sure you discuss any questions you have with your health care provider. Document Revised: 09/23/2018 Document Reviewed: 09/23/2018 Elsevier Patient Education  2020 Elsevier Inc.   

## 2020-06-20 ENCOUNTER — Emergency Department
Admission: EM | Admit: 2020-06-20 | Discharge: 2020-06-21 | Disposition: A | Discharge status: Home / Self Care | Payer: Medicare Other | Attending: Emergency Medicine | Admitting: Emergency Medicine

## 2020-06-20 ENCOUNTER — Other Ambulatory Visit: Payer: Self-pay

## 2020-06-20 DIAGNOSIS — Z7982 Long term (current) use of aspirin: Secondary | ICD-10-CM | POA: Insufficient documentation

## 2020-06-20 DIAGNOSIS — I110 Hypertensive heart disease with heart failure: Secondary | ICD-10-CM | POA: Insufficient documentation

## 2020-06-20 DIAGNOSIS — Z79899 Other long term (current) drug therapy: Secondary | ICD-10-CM | POA: Insufficient documentation

## 2020-06-20 DIAGNOSIS — Z85828 Personal history of other malignant neoplasm of skin: Secondary | ICD-10-CM | POA: Insufficient documentation

## 2020-06-20 DIAGNOSIS — Z85038 Personal history of other malignant neoplasm of large intestine: Secondary | ICD-10-CM | POA: Insufficient documentation

## 2020-06-20 DIAGNOSIS — F329 Major depressive disorder, single episode, unspecified: Secondary | ICD-10-CM | POA: Insufficient documentation

## 2020-06-20 DIAGNOSIS — F320 Major depressive disorder, single episode, mild: Secondary | ICD-10-CM | POA: Insufficient documentation

## 2020-06-20 DIAGNOSIS — F03A0 Unspecified dementia, mild, without behavioral disturbance, psychotic disturbance, mood disturbance, and anxiety: Secondary | ICD-10-CM

## 2020-06-20 DIAGNOSIS — R4182 Altered mental status, unspecified: Secondary | ICD-10-CM | POA: Diagnosis not present

## 2020-06-20 DIAGNOSIS — I509 Heart failure, unspecified: Secondary | ICD-10-CM | POA: Diagnosis not present

## 2020-06-20 DIAGNOSIS — Z853 Personal history of malignant neoplasm of breast: Secondary | ICD-10-CM | POA: Diagnosis not present

## 2020-06-20 DIAGNOSIS — F32A Depression, unspecified: Secondary | ICD-10-CM

## 2020-06-20 LAB — COMPREHENSIVE METABOLIC PANEL
ALT: 56 U/L — ABNORMAL HIGH (ref 0–44)
AST: 26 U/L (ref 15–41)
Albumin: 3.2 g/dL — ABNORMAL LOW (ref 3.5–5.0)
Alkaline Phosphatase: 50 U/L (ref 38–126)
Anion gap: 9 (ref 5–15)
BUN: 20 mg/dL (ref 8–23)
CO2: 33 mmol/L — ABNORMAL HIGH (ref 22–32)
Calcium: 8.6 mg/dL — ABNORMAL LOW (ref 8.9–10.3)
Chloride: 91 mmol/L — ABNORMAL LOW (ref 98–111)
Creatinine, Ser: 0.95 mg/dL (ref 0.44–1.00)
GFR, Estimated: 57 mL/min — ABNORMAL LOW (ref >60)
Glucose, Bld: 112 mg/dL — ABNORMAL HIGH (ref 70–99)
Potassium: 4.2 mmol/L (ref 3.5–5.1)
Sodium: 133 mmol/L — ABNORMAL LOW (ref 135–145)
Total Bilirubin: 0.7 mg/dL (ref 0.3–1.2)
Total Protein: 6.2 g/dL — ABNORMAL LOW (ref 6.5–8.1)

## 2020-06-20 LAB — CBC
HCT: 33.9 % — ABNORMAL LOW (ref 36.0–46.0)
Hemoglobin: 10.5 g/dL — ABNORMAL LOW (ref 12.0–15.0)
MCH: 30 pg (ref 26.0–34.0)
MCHC: 31 g/dL (ref 30.0–36.0)
MCV: 96.9 fL (ref 80.0–100.0)
Platelets: 292 10*3/uL (ref 150–400)
RBC: 3.5 MIL/uL — ABNORMAL LOW (ref 3.87–5.11)
RDW: 15.4 % (ref 11.5–15.5)
WBC: 5.8 10*3/uL (ref 4.0–10.5)
nRBC: 0 % (ref 0.0–0.2)

## 2020-06-20 LAB — URINALYSIS, ROUTINE W REFLEX MICROSCOPIC
Bilirubin Urine: NEGATIVE
Glucose, UA: NEGATIVE mg/dL
Hgb urine dipstick: NEGATIVE
Ketones, ur: NEGATIVE mg/dL
Nitrite: NEGATIVE
Protein, ur: >=300 mg/dL — AB
Specific Gravity, Urine: 1.032 — ABNORMAL HIGH (ref 1.005–1.030)
WBC, UA: >50 WBC/hpf — ABNORMAL HIGH (ref 0–5)
pH: 5 (ref 5.0–8.0)

## 2020-06-20 LAB — CBG MONITORING, ED: Glucose-Capillary: 100 mg/dL — ABNORMAL HIGH (ref 70–99)

## 2020-06-20 MED ORDER — SODIUM CHLORIDE 0.9 % IV BOLUS
500.0000 mL | Freq: Once | INTRAVENOUS | Status: AC
  Administered 2020-06-20: 500 mL via INTRAVENOUS

## 2020-06-20 MED ORDER — FOSFOMYCIN TROMETHAMINE 3 G PO PACK
3.0000 g | PACK | Freq: Once | ORAL | Status: AC
  Administered 2020-06-21: 3 g via ORAL
  Filled 2020-06-20: qty 3

## 2020-06-20 NOTE — ED Notes (Signed)
Pt arrives in recliner and helped on stretcher and undressed and placed in gown, removed her depend and cleaned her up with warm soapy washloth.  Pt depend which has been on her for nearly 9 hours is dry.  Pt is crying softly and states that she feels miserable. Per her son she has been crying all night.

## 2020-06-20 NOTE — ED Notes (Signed)
Brought pt a diet coke and helped her with it.  Pt was able

## 2020-06-20 NOTE — ED Provider Notes (Signed)
[facility] Emergency Department Provider Note   ____________________________________________   First MD Initiated Contact with Patient 06/20/20 2055     (approximate)  I have reviewed the triage vital signs and the nursing notes.   HISTORY  Chief Complaint Altered Mental Status    HPI Janice [name] is a 86 y.o. female with a past medical history of hypertension, diffuse large cell lymphoma, and CHF who presents for depressive symptoms.  Patient was sent from [facility] clinic cardiology with her son who states that patient has been softly crying for the last few months intermittently.  Also states that she has been increasingly withdrawn and unable to get herself out of bed.  Son also states that patient has been decreasing her p.o. intake and he is concerned that she is "wasting away".  Son does state patient has a history of depression but has not been on medication for a long time.  Patient states that she "feels miserable all over" but will not give me any more information         Past Medical History:  Diagnosis Date  . Breast cancer ([place]) 2017   DCIS of right breast  . Colon cancer ([place]) 2003   adenocarcinoma of the ascending colon, 2007  . Environmental allergies   . High cholesterol   . Hypertension   . Large cell lymphoma ([place]) 2007   Diffuse large cell lymphoma, completed CHOP and Rituxan in March 2004  . Non Hodgkin's lymphoma ([place]) 2003  . Personal history of chemotherapy 2003   Non Hodgkins  . Skin cancer     Patient Active Problem List   Diagnosis Date Noted  . Goals of care, counseling/discussion   . Palliative care by specialist   . Generalized weakness 05/19/2020  . Acute CHF (congestive heart failure) ([place]) 05/19/2020  . Acute respiratory failure with hypoxia ([place]) 05/18/2020  . Abnormal bone marrow examination 04/17/2020  . Hypoxia   . Closed sacral fracture ([place]) 01/02/2020  . Inadequate pain control   . Breast mass,  right   . Ductal carcinoma in situ (DCIS) of right breast 03/26/2016  . History of non-Hodgkin's lymphoma 07/19/2015  . Bronchitis 11/23/2013  . History of colon cancer 11/23/2013  . HTN (hypertension) 11/23/2013  . Hypercholesterolemia 11/23/2013    Past Surgical History:  Procedure Laterality Date  . BREAST BIOPSY Right 03/18/2016   DCIS  . BREAST BIOPSY Right 08/19/2017   ribbon marker, Fibrocystic changes- neg  . BREAST LUMPECTOMY Right 04/24/2016   DCIS clear margins. SN biopsy-negative. Declined rad tx.   Marland Kitchen BREAST LUMPECTOMY WITH NEEDLE LOCALIZATION Right 04/24/2016   Procedure: BREAST LUMPECTOMY WITH NEEDLE LOCALIZATION;  Surgeon: Jules Husbands, MD;  Location: [facility] ORS;  Service: General;  Laterality: Right;  . COLON SURGERY  11/24/05   Cancer surgery  . COLONOSCOPY     2012  . COLONOSCOPY N/A 08/28/2015   Procedure: COLONOSCOPY;  Surgeon: Hulen Luster, MD;  Location: [facility];  Service: Gastroenterology;  Laterality: N/A;  . DIAGNOSTIC MAMMOGRAM     May 2014  . KYPHOPLASTY N/A 03/28/2020   Procedure: T9 AND T12 KYPHOPLASTY;  Surgeon: Hessie Knows, MD;  Location: [facility] ORS;  Service: Orthopedics;  Laterality: N/A;  . LYMPH NODE DISSECTION  2003   neck - non-hodgkin's lymphoma  . SACROPLASTY N/A 01/04/2020   Procedure: SACROPLASTY;  Surgeon: Hessie Knows, MD;  Location: [facility] ORS;  Service: Orthopedics;  Laterality: N/A;  . SENTINEL NODE BIOPSY Right 04/24/2016  Procedure: SENTINEL NODE BIOPSY;  Surgeon: Jules Husbands, MD;  Location: [facility] ORS;  Service: General;  Laterality: Right;    Prior to Admission medications   Medication Sig Start Date End Date Taking? Authorizing Provider  acetaminophen (TYLENOL) 500 MG tablet Take 500 mg by mouth every 6 (six) hours as needed.    [provider]  alendronate (FOSAMAX) 70 MG tablet Take 70 mg by mouth once a week.  01/06/19   [provider]  amLODipine (NORVASC) 10 MG tablet Take 10 mg by mouth daily.  01/06/19    [provider]  aspirin 81 MG tablet Take 81 mg by mouth daily.    [provider]  lisinopril (PRINIVIL,ZESTRIL) 20 MG tablet Take 20 mg by mouth daily.    [provider]  polyethylene glycol (MIRALAX / GLYCOLAX) 17 g packet Take 17 g by mouth daily as needed for mild constipation. 01/05/20   [name], MD  Pseudoeph-Doxylamine-DM-APAP (NYQUIL PO) Take 30 mLs by mouth daily as needed (cough).    [provider]  risperiDONE (RISPERDAL M-TABS) 0.5 MG disintegrating tablet Take 1 tablet (0.5 mg total) by mouth 2 (two) times daily. 05/21/20   Max Sane, MD  tamoxifen (NOLVADEX) 20 MG tablet Take 1 tablet (20 mg total) by mouth daily. Patient MUST make an appointment to be seen before another refill will be approved 04/22/20   Lequita Asal, MD  Vitamin D, Ergocalciferol, (DRISDOL) 1.25 MG (50000 UT) CAPS capsule Take 50,000 Units by mouth every 7 (seven) days.  01/06/19   [provider]    Allergies Patient has no known allergies.  Family History  Problem Relation Age of Onset  . Breast cancer Sister 59       half sister  . Cancer Other        family history of colon cancer, breast cancer, lung cancer  . Cancer Paternal Uncle        colon  . Colon cancer Maternal Uncle   . Stomach cancer Maternal Grandmother   . Heart attack Mother 13  . Hypertension Mother   . Other Father        "old age"    Social History Social History   Tobacco Use  . Smoking status: Never Smoker  . Smokeless tobacco: Never Used  Vaping Use  . Vaping Use: Never used  Substance Use Topics  . Alcohol use: No  . Drug use: No    Review of Systems Unable to assess ____________________________________________   PHYSICAL EXAM:  VITAL SIGNS: ED Triage Vitals  Enc Vitals Group     BP 06/20/20 1357 123/78     Pulse Rate 06/20/20 1357 90     Resp 06/20/20 1357 16     Temp 06/20/20 1357 98.9 F (37.2 C)     Temp Source 06/20/20 1357 Oral      SpO2 06/20/20 1357 99 %     Weight 06/20/20 1356 163 lb (73.9 kg)     Height 06/20/20 1356 5\' 4"  (1.626 m)     Head Circumference --      Peak Flow --      Pain Score 06/20/20 1850 6     Pain Loc --      Pain Edu? --      Excl. in [place]? --    Constitutional: Alert and oriented. Well appearing and in no acute distress. Eyes: Conjunctivae are normal. PERRL. Head: Atraumatic. Nose: No congestion/rhinnorhea. Mouth/Throat: Mucous membranes are moist. Neck:  No stridor Cardiovascular: Grossly normal heart sounds.  Good peripheral circulation. Respiratory: Normal respiratory effort.  No retractions. Gastrointestinal: Soft and nontender. No distention. Musculoskeletal: No obvious deformities Neurologic:  Normal speech and language. No gross focal neurologic deficits are appreciated. Skin:  Skin is warm and dry. No rash noted. Psychiatric: Mood is depressed and patient is tearful.  Speech is shaking and behavior is cooperative  ____________________________________________   LABS (all labs ordered are listed, but only abnormal results are displayed)  Labs Reviewed  COMPREHENSIVE METABOLIC PANEL - Abnormal; Notable for the following components:      Result Value   Sodium 133 (*)    Chloride 91 (*)    CO2 33 (*)    Glucose, Bld 112 (*)    Calcium 8.6 (*)    Total Protein 6.2 (*)    Albumin 3.2 (*)    ALT 56 (*)    GFR, Estimated 57 (*)    All other components within normal limits  CBC - Abnormal; Notable for the following components:   RBC 3.50 (*)    Hemoglobin 10.5 (*)    HCT 33.9 (*)    All other components within normal limits  URINALYSIS, ROUTINE W REFLEX MICROSCOPIC - Abnormal; Notable for the following components:   Color, Urine AMBER (*)    APPearance CLOUDY (*)    Specific Gravity, Urine 1.032 (*)    Protein, ur >=300 (*)    Leukocytes,Ua TRACE (*)    WBC, UA >50 (*)    Bacteria, UA RARE (*)    All other components within normal limits  CBG MONITORING, ED - Abnormal;  Notable for the following components:   Glucose-Capillary 100 (*)    All other components within normal limits   ____________________________________________  EKG  ED ECG REPORT I, Naaman Plummer, the attending physician, personally viewed and interpreted this ECG.  Date: 06/20/2020 EKG Time: 1359 Rate: 83 Rhythm: Atrial fibrillation QRS Axis: normal Intervals: normal ST/T Wave abnormalities: normal Narrative Interpretation: no evidence of acute ischemia   PROCEDURES  Procedure(s) performed (including Critical Care):  .1-3 Lead EKG Interpretation Performed by: Naaman Plummer, MD Authorized by: Naaman Plummer, MD     Interpretation: abnormal     ECG rate:  87   ECG rate assessment: normal     Rhythm: atrial fibrillation     Ectopy: none     Conduction: normal       ____________________________________________   INITIAL IMPRESSION / ASSESSMENT AND PLAN / ED COURSE  As part of my medical decision making, I reviewed the following data within the [place] notes reviewed and incorporated, Labs reviewed, EKG interpreted, Old chart reviewed, Radiograph reviewed and Notes from prior ED visits reviewed and incorporated        Thoughts are linear and organized, and patient has no SI, AH, VH, or HI. Prior suicide attempt: Denies Prior Psychiatric Hospitalizations: Denies  Clinically patient displays no overt toxidrome; they are well appearing, with low suspicion for toxic ingestion given history and exam. Thoughts unlikely 2/2 anemia, hypothyroidism, infection, or ICH.  Consult: Psychiatry to evaluate patient for potential hold for danger to self. Disposition: Care of this patient will be signed out to the oncoming physician at the end of my shift.  All pertinent patient information conveyed and all questions answered.  All further care and disposition decisions will be made by the oncoming physician.       ____________________________________________   FINAL CLINICAL IMPRESSION(S) /  ED DIAGNOSES  Final diagnoses:  Altered mental status, unspecified altered mental status type  Depression, unspecified depression type     ED Discharge Orders    None       Note:  This document was prepared using Dragon voice recognition software and may include unintentional dictation errors.   Naaman Plummer, MD 06/20/20 [phone]

## 2020-06-20 NOTE — ED Triage Notes (Addendum)
Pt here with son from [facility] cardiology.   Per son, pt has been softly crying, and had progressive altered mental status. Issues first started in June when she had a back surgery. Both of pt's sons take care of pt and live with her. Per son, she doesn't sleep at all at night.    Pt on 3L [place] chronic.

## 2020-06-21 DIAGNOSIS — R4182 Altered mental status, unspecified: Secondary | ICD-10-CM | POA: Diagnosis present

## 2020-06-21 DIAGNOSIS — F320 Major depressive disorder, single episode, mild: Secondary | ICD-10-CM

## 2020-06-21 DIAGNOSIS — F03A0 Unspecified dementia, mild, without behavioral disturbance, psychotic disturbance, mood disturbance, and anxiety: Secondary | ICD-10-CM

## 2020-06-21 MED ORDER — TAMOXIFEN CITRATE 10 MG PO TABS
20.0000 mg | ORAL_TABLET | Freq: Every day | ORAL | Status: DC
  Administered 2020-06-21: 20 mg via ORAL
  Filled 2020-06-21: qty 2

## 2020-06-21 MED ORDER — MIRTAZAPINE 15 MG PO TABS: 7.5000 mg | ORAL_TABLET | Freq: Every day | ORAL | Status: DC

## 2020-06-21 MED ORDER — MIRTAZAPINE 7.5 MG PO TABS: 7.5000 mg | ORAL_TABLET | Freq: Every day | ORAL | 1 refills | Status: AC

## 2020-06-21 MED ORDER — RISPERIDONE 0.5 MG PO TBDP
0.5000 mg | ORAL_TABLET | Freq: Two times a day (BID) | ORAL | Status: DC
  Administered 2020-06-21: 0.5 mg via ORAL
  Filled 2020-06-21 (×2): qty 1

## 2020-06-21 MED ORDER — DILTIAZEM HCL ER COATED BEADS 120 MG PO CP24
120.0000 mg | ORAL_CAPSULE | Freq: Every day | ORAL | Status: DC
  Administered 2020-06-21: 120 mg via ORAL
  Filled 2020-06-21: qty 1

## 2020-06-21 MED ORDER — LISINOPRIL 10 MG PO TABS
20.0000 mg | ORAL_TABLET | Freq: Every day | ORAL | Status: DC
  Administered 2020-06-21: 20 mg via ORAL
  Filled 2020-06-21: qty 2

## 2020-06-21 MED ORDER — ASPIRIN EC 81 MG PO TBEC
81.0000 mg | DELAYED_RELEASE_TABLET | Freq: Every day | ORAL | Status: DC
  Administered 2020-06-21: 81 mg via ORAL
  Filled 2020-06-21: qty 1

## 2020-06-21 NOTE — TOC Transition Note (Addendum)
Transition of Care [facility]) - CM/SW Discharge Note   Patient Details  Name: [name] MRN: [id number] Date of Birth: Aug 02, 1928  [facility]) CM/SW Contact:  Ova Freshwater Phone Number: [phone] 06/21/2020, 4:34 PM   Clinical Narrative:     Patient will d/c home w/ sons. ETA for pick up 5:30-7:00PM.  EDP/ED Staff updated.   CSW spoke with Sylvie, [place] (Son) [phone] about different resources and options including home health and reaching out to the patient's insurance company for counselor or therapist referrals.  Mr. 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 Gainey to allow the home health PT decide whether they would be able to work with the patient at the home and he said he did not want anyone to come to the house as it was.  CSW verbalized understanding.  CSW also suggested long term care. But Mr. Valvano stated he did think his mother would be eligible for Medicaid and he did not want to "just give someone else all the money we worked so hard for."  CSW encouraged they speak with an Forensic scientist to figure out her assets and and possibly find either private home health aid or a long term care facility.  Mr. Pierrelouis verbalized understanding. TOC consult complete.    Barriers to Discharge: Barriers Resolved   Patient Goals and CMS Choice        Discharge Placement                       Discharge Plan and Services                                     Social Determinants of Health (SDOH) Interventions     Readmission Risk Interventions No flowsheet data found.

## 2020-06-21 NOTE — ED Notes (Signed)
Report received from Ellen RN. Patient care assumed. Patient/RN introduction complete. Will continue to monitor.  

## 2020-06-21 NOTE — ED Notes (Signed)
Hourly rounding revealed patient in room. No complaints, stable and in no acute distress. Q15 minute rounds and monitoring via Engineer, drilling to continue.

## 2020-06-21 NOTE — ED Notes (Signed)
Unable to obtain vitals at this time due to Pt sleeping. Will obtain when Pt wakes.

## 2020-06-21 NOTE — ED Notes (Signed)
Came to check on pt and found that that she had pulled her IV that was covered in a securing coban dressing out.  Gown and blankets have blood stains on them, changed both.  Pt continues to whimper softly but is still alert and able to communicate her needs with me.

## 2020-06-21 NOTE — ED Notes (Signed)
Pt drank a second diet coke and with verbal permission from Dr. Beather Arbour pt was also given a chocolate ensure at this time.  Pt drank about half of the ensure.  Pt continues her whimpering/soft sobbing at this time between taking sips and speaking with me.  She is expressing thanks for the care she is receiving both her and from her sons and states that she is sorry and thinks "it has become a habit"  I let her know that it does not bother [place] but if she needs anything to please let me know.

## 2020-06-21 NOTE — ED Notes (Signed)
E-signature not working at this time. Pt and son verbalized understanding of D/C instructions, prescriptions and follow up care with no further questions at this time. Pt in NAD and wheeled to sons car at time of D/C. Pt assisted into son's car by this RN, and olivia, NT.

## 2020-06-21 NOTE — ED Provider Notes (Signed)
Emergency Medicine Observation Re-evaluation Note  Janice [name] is a 86 y.o. female, seen on rounds today.  Pt initially presented to the ED for complaints of Altered Mental Status Currently, the patient is resting, voices no medical complaint.  Physical Exam  BP (!) 163/102   Pulse 90   Temp (!) 97.4 F (36.3 C) (Oral)   Resp 16   Ht 5\' 4"  (1.626 m)   Wt 73.9 kg   SpO2 94%   BMI 27.97 kg/m  Physical Exam General: Resting in no acute distress Cardiac: No cyanosis Lungs: Equal rise and fall Psych: Not agitated  ED Course / MDM  EKG:EKG Interpretation  Date/Time:  Thursday June 20 2020 13:59:27 EST Ventricular Rate:  83 PR Interval:    QRS Duration: 118 QT Interval:  400 QTC Calculation: 470 R Axis:   -50 Text Interpretation: Atrial fibrillation with premature ventricular or aberrantly conducted complexes Left anterior fascicular block Left ventricular hypertrophy with QRS widening ( R in aVL , Cornell product , Romhilt-Estes ) Abnormal ECG Confirmed by UNCONFIRMED, DOCTOR ([id number]), editor Mel Almond, Tammy [phone]) on 06/20/2020 2:38:34 PM    I have reviewed the labs performed to date as well as medications administered while in observation.  Recent changes in the last 24 hours include continued crying overnight.  Crying lessened after patient had large bowel movement and ate a sandwich tray.  I ordered patient's home medications; this is based off of her most recent office visit from 06/11/2020 where amlodipine was changed to diltiazem CD 120 mg, and her cholesterol medicine was discontinued.  Plan  Current plan is for reassessment in the morning per psychiatric NP recommendations. Patient is not under full IVC at this time.   Paulette Blanch, MD 06/21/20 [phone]

## 2020-06-21 NOTE — ED Notes (Signed)
Pt incontinent of large soft stool, peri care done and depends in place.

## 2020-06-21 NOTE — ED Notes (Signed)
Pt given sandwich tray 

## 2020-06-21 NOTE — Consult Note (Signed)
[facility] Face-to-Face Psychiatry Consult   Reason for Consult: AMS Referring Physician: Dr. Beather Arbour Patient Identification: Janice [name] MRN:  [id number] Principal Diagnosis: <principal problem not specified> Diagnosis:  Active Problems:   AMS (altered mental status)   Total Time spent with patient: 20 minutes  Subjective: "If I can just go to the bedside commode I will be okay." Janice [name] is a 86 y.o. female patient presented to [facility] ED with her son at her side. Per the ED triage nurse not, the patient's son has been softly crying and had progressive altered mental status. Issues first started in June when she had back surgery. Both of pt's sons take care of pt and live with her. Per son, she doesn't sleep at all at night.   Pt on 3L  chronic.  The patient's son reported that the patient's behavior became concerning when she kept crying, and she wouldn't stop. This behavior began last June.  Her son, the patient's problems, discussed it.  He was concerned after her back surgery. The patient discloses that both of her sons live with her and take care of her in their house.  The patient was seen face-to-face by this provider; the chart was reviewed and consulted with Dr. Beather Arbour on 06/21/2020 due to the patient's care. It was discussed with the EDP that the patient would remain under observation and reassessed in the a.m. to determine if she meets the criteria for senior psychiatric inpatient admission or she could be discharged back home.  During the patient assessment, she is alert and oriented x3, crying and cooperative, and mood-congruent with affect.  The patient does appear to be responding to internal and external stimuli. Neither is the patient presenting with any delusional thinking. The patient denies auditory or visual hallucinations. The patient denies any suicidal, homicidal, or self-harm ideations. The patient denies any current psychiatrist or outpatient treatment but has a history of  depression and was seeing a psychiatrist in the past per her sons. She reports living with her two sons "they are very kind to me   HPI:    Past Psychiatric History:   Risk to Self:  No Risk to Others:   No Prior Inpatient Therapy:   No Prior Outpatient Therapy:  No  Past Medical History:  Past Medical History:  Diagnosis Date  . 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 Colon cancer ([place]) 2003   adenocarcinoma of the ascending colon, 2007  . Environmental allergies   . High cholesterol   . Hypertension   . Large cell lymphoma ([place]) 2007   Diffuse large cell lymphoma, completed CHOP and Rituxan in March 2004  . Non Hodgkin's lymphoma ([place]) 2003  . Personal history of chemotherapy 2003   Non Hodgkins  . Skin cancer     Past Surgical History:  Procedure Laterality Date  . BREAST BIOPSY Right 03/18/2016   DCIS  . BREAST BIOPSY Right 08/19/2017   ribbon marker, Fibrocystic changes- neg  . BREAST LUMPECTOMY Right 04/24/2016   DCIS clear margins. SN biopsy-negative. Declined rad tx.   Marland Kitchen BREAST LUMPECTOMY WITH NEEDLE LOCALIZATION Right 04/24/2016   Procedure: BREAST LUMPECTOMY WITH NEEDLE LOCALIZATION;  Surgeon: Jules Husbands, MD;  Location: [facility] ORS;  Service: General;  Laterality: Right;  . COLON SURGERY  11/24/05   Cancer surgery  . COLONOSCOPY     2012  . COLONOSCOPY N/A 08/28/2015   Procedure: COLONOSCOPY;  Surgeon: Hulen Luster, MD;  Location: [facility];  Service: Gastroenterology;  Laterality: N/A;  . DIAGNOSTIC MAMMOGRAM     May 2014  . KYPHOPLASTY N/A 03/28/2020   Procedure: T9 AND T12 KYPHOPLASTY;  Surgeon: Hessie Knows, MD;  Location: [facility] ORS;  Service: Orthopedics;  Laterality: N/A;  . LYMPH NODE DISSECTION  2003   neck - non-hodgkin's lymphoma  . SACROPLASTY N/A 01/04/2020   Procedure: SACROPLASTY;  Surgeon: Hessie Knows, MD;  Location: [facility] ORS;  Service: Orthopedics;  Laterality: N/A;  . SENTINEL NODE BIOPSY Right 04/24/2016   Procedure: SENTINEL  NODE BIOPSY;  Surgeon: Jules Husbands, MD;  Location: [facility] ORS;  Service: General;  Laterality: Right;   Family History:  Family History  Problem Relation Age of Onset  . Breast cancer Sister 23       half sister  . Cancer Other        family history of colon cancer, breast cancer, lung cancer  . Cancer Paternal Uncle        colon  . Colon cancer Maternal Uncle   . Stomach cancer Maternal Grandmother   . Heart attack Mother 51  . Hypertension Mother   . Other Father        "old age"   Family Psychiatric  History:  Social History:  Social History   Substance and Sexual Activity  Alcohol Use No     Social History   Substance and Sexual Activity  Drug Use No    Social History   Socioeconomic History  . Marital status: Widowed    Spouse name: Not on file  . Number of children: Not on file  . Years of education: Not on file  . Highest education level: Not on file  Occupational History  . Not on file  Tobacco Use  . Smoking status: Never Smoker  . Smokeless tobacco: Never Used  Vaping Use  . Vaping Use: Never used  Substance and Sexual Activity  . Alcohol use: No  . Drug use: No  . Sexual activity: Never  Other Topics Concern  . Not on file  Social History Narrative  . Not on file   Social Determinants of Health   Financial Resource Strain:   . Difficulty of Paying Living Expenses: Not on file  Food Insecurity:   . Worried About Charity fundraiser in the Last Year: Not on file  . Ran Out of Food in the Last Year: Not on file  Transportation Needs:   . Lack of Transportation (Medical): Not on file  . Lack of Transportation (Non-Medical): Not on file  Physical Activity:   . Days of Exercise per Week: Not on file  . Minutes of Exercise per Session: Not on file  Stress:   . Feeling of Stress : Not on file  Social Connections:   . Frequency of Communication with Friends and Family: Not on file  . Frequency of Social Gatherings with Friends and Family: Not  on file  . Attends Religious Services: Not on file  . Active Member of Clubs or Organizations: Not on file  . Attends Archivist Meetings: Not on file  . Marital Status: Not on file   Additional Social History:    Allergies:  No Known Allergies  Labs:  Results for orders placed or performed during the hospital encounter of 06/20/20 (from the past 48 hour(s))  Urinalysis, Routine w reflex microscopic     Status: Abnormal   Collection Time: 06/20/20  1:58 PM  Result Value Ref Range  Color, Urine AMBER (A) YELLOW    Comment: BIOCHEMICALS MAY BE AFFECTED BY COLOR   APPearance CLOUDY (A) CLEAR   Specific Gravity, Urine 1.032 (H) 1.005 - 1.030   pH 5.0 5.0 - 8.0   Glucose, UA NEGATIVE NEGATIVE mg/dL   Hgb urine dipstick NEGATIVE NEGATIVE   Bilirubin Urine NEGATIVE NEGATIVE   Ketones, ur NEGATIVE NEGATIVE mg/dL   Protein, ur >=300 (A) NEGATIVE mg/dL   Nitrite NEGATIVE NEGATIVE   Leukocytes,Ua TRACE (A) NEGATIVE   RBC / HPF 11-20 0 - 5 RBC/hpf   WBC, UA >50 (H) 0 - 5 WBC/hpf   Bacteria, UA RARE (A) NONE SEEN   Squamous Epithelial / LPF 21-50 0 - 5   WBC Clumps PRESENT    Mucus PRESENT    Hyaline Casts, UA PRESENT     Comment: Performed at [facility], [address] [place], [place] [address]  Comprehensive metabolic panel     Status: Abnormal   Collection Time: 06/20/20  2:04 PM  Result Value Ref Range   Sodium 133 (L) 135 - 145 mmol/L   Potassium 4.2 3.5 - 5.1 mmol/L   Chloride 91 (L) 98 - 111 mmol/L   CO2 33 (H) 22 - 32 mmol/L   Glucose, Bld 112 (H) 70 - 99 mg/dL    Comment: Glucose reference range applies only to samples taken after fasting for at least 8 hours.   BUN 20 8 - 23 mg/dL   Creatinine, Ser 0.95 0.44 - 1.00 mg/dL   Calcium 8.6 (L) 8.9 - 10.3 mg/dL   Total Protein 6.2 (L) 6.5 - 8.1 g/dL   Albumin 3.2 (L) 3.5 - 5.0 g/dL   AST 26 15 - 41 U/L   ALT 56 (H) 0 - 44 U/L   Alkaline Phosphatase 50 38 - 126 U/L   Total Bilirubin 0.7 0.3 - 1.2  mg/dL   GFR, Estimated 57 (L) >60 mL/min    Comment: (NOTE) Calculated using the CKD-EPI Creatinine Equation (2021)    Anion gap 9 5 - 15    Comment: Performed at [facility], [address] [place], [place] [address]  CBC     Status: Abnormal   Collection Time: 06/20/20  2:04 PM  Result Value Ref Range   WBC 5.8 4.0 - 10.5 K/uL   RBC 3.50 (L) 3.87 - 5.11 MIL/uL   Hemoglobin 10.5 (L) 12.0 - 15.0 g/dL   HCT 33.9 (L) 36 - 46 %   MCV 96.9 80.0 - 100.0 fL   MCH 30.0 26.0 - 34.0 pg   MCHC 31.0 30.0 - 36.0 g/dL   RDW 15.4 11.5 - 15.5 %   Platelets 292 150 - 400 K/uL   nRBC 0.0 0.0 - 0.2 %    Comment: Performed at [facility], [address] [place],  [address]  CBG monitoring, ED     Status: Abnormal   Collection Time: 06/20/20  2:09 PM  Result Value Ref Range   Glucose-Capillary 100 (H) 70 - 99 mg/dL    Comment: Glucose reference range applies only to samples taken after fasting for at least 8 hours.   Comment 1 Notify RN    Comment 2 Document in Chart     Current Facility-Administered Medications  Medication Dose Route Frequency Provider Last Rate Last Admin  . aspirin EC tablet 81 mg  81 mg Oral Daily Paulette Blanch, MD      . diltiazem (CARDIZEM CD) 24 hr capsule 120  mg  120 mg Oral Daily Paulette Blanch, MD      . lisinopril (ZESTRIL) tablet 20 mg  20 mg Oral Daily Paulette Blanch, MD      . risperiDONE (RISPERDAL M-TABS) disintegrating tablet 0.5 mg  0.5 mg Oral BID Paulette Blanch, MD   0.5 mg at 06/21/20 0145  . tamoxifen (NOLVADEX) tablet 20 mg  20 mg Oral Daily Paulette Blanch, MD       Current Outpatient Medications  Medication Sig Dispense Refill  . acetaminophen (TYLENOL) 500 MG tablet Take 500 mg by mouth every 6 (six) hours as needed.    Marland Kitchen alendronate (FOSAMAX) 70 MG tablet Take 70 mg by mouth once a week.     Marland Kitchen amLODipine (NORVASC) 10 MG tablet Take 10 mg by mouth daily.     Marland Kitchen aspirin 81 MG tablet Take 81 mg by mouth daily.    Marland Kitchen lisinopril  (PRINIVIL,ZESTRIL) 20 MG tablet Take 20 mg by mouth daily.    . polyethylene glycol (MIRALAX / GLYCOLAX) 17 g packet Take 17 g by mouth daily as needed for mild constipation. 14 each 0  . Pseudoeph-Doxylamine-DM-APAP (NYQUIL PO) Take 30 mLs by mouth daily as needed (cough).    . risperiDONE (RISPERDAL M-TABS) 0.5 MG disintegrating tablet Take 1 tablet (0.5 mg total) by mouth 2 (two) times daily.    . tamoxifen (NOLVADEX) 20 MG tablet Take 1 tablet (20 mg total) by mouth daily. Patient MUST make an appointment to be seen before another refill will be approved 90 tablet 0  . Vitamin D, Ergocalciferol, (DRISDOL) 1.25 MG (50000 UT) CAPS capsule Take 50,000 Units by mouth every 7 (seven) days.       Musculoskeletal: Strength & Muscle Tone: decreased Gait & Station: unsteady Patient leans: N/A  Psychiatric Specialty Exam: Physical Exam Vitals and nursing note reviewed.  HENT:     Nose: Nose normal.     Mouth/Throat:     Mouth: Mucous membranes are dry.  Cardiovascular:     Rate and Rhythm: Normal rate.     Pulses: Normal pulses.  Pulmonary:     Effort: Pulmonary effort is normal.  Musculoskeletal:        General: Tenderness present.     Cervical back: Normal range of motion and neck supple.  Neurological:     Mental Status: She is alert.  Psychiatric:        Attention and Perception: She is inattentive.        Mood and Affect: Mood is anxious. Affect is inappropriate.        Speech: Speech is tangential.        Behavior: Behavior is withdrawn. Behavior is cooperative.        Cognition and Memory: Memory is impaired. She exhibits impaired recent memory and impaired remote memory.        Judgment: Judgment is inappropriate.     Review of Systems  Psychiatric/Behavioral: Positive for behavioral problems, confusion and sleep disturbance. The patient is nervous/anxious.   All other systems reviewed and are negative.   Blood pressure 137/72, pulse 83, temperature (!) 97.4 F (36.3 C),  temperature source Oral, resp. rate 16, height 5\' 4"  (1.626 m), weight 73.9 kg, SpO2 93 %.Body mass index is 27.97 kg/m.  General Appearance: Bizarre  Eye Contact:  Fair  Speech:  Garbled and Slow  Volume:  Decreased  Mood:  Anxious, Euphoric and Irritable  Affect:  Appropriate, Congruent and Depressed  Thought Process:  Disorganized  Orientation:  Other:  A &Ox1  Thought Content:  Delusions, Rumination and Tangential  Suicidal Thoughts:  No  Homicidal Thoughts:  No  Memory:  Immediate;   Fair Recent;   Good Remote;   Fair  Judgement:  Poor  Insight:  Lacking  Psychomotor Activity:  Increased  Concentration:  Concentration: Fair and Attention Span: Fair  Recall:  [employer] of Knowledge:  Fair  Language:  Poor  Akathisia:  Negative  Handed:  Right  AIMS (if indicated):     Assets:  Communication Skills Desire for Improvement Physical Health Resilience Social Support  ADL's:  Impaired  Cognition:  Impaired,  Mild  Sleep:        Treatment Plan Summary: Daily contact with patient to assess and evaluate symptoms and progress in treatment, Medication management and Plan The patient remained under observation overnight and will be reassessed in the a.m. to determine if she meets the criteria for psychiatric inpatient admission; she could be discharged back home.  Disposition: Supportive therapy provided about ongoing stressors. The patient remained under observation overnight and will be reassessed in the a.m. to determine if she meets the criteria for psychiatric inpatient admission; she could be discharged back home.  Caroline Sauger, NP 06/21/2020 4:16 AM

## 2020-06-21 NOTE — ED Notes (Signed)
Hourly rounding revealed patient in room. No complaints, stable, in no acute distress. Q15 minute rounds and monitoring via Rover and Officer to continue.  

## 2020-06-21 NOTE — ED Notes (Signed)
Hourly rounding revealed patient in room. No complaints, stable and in no acute stress. Q15 rounds and monitoring via Engineer, drilling to continue.

## 2020-06-21 NOTE — ED Notes (Signed)
Hourly rounding revealed patient in room. No complaints, stable, in no acute distress. Q15 rounds and monitoring via Engineer, drilling to continue.

## 2020-06-21 NOTE — ED Notes (Signed)
Hourly rounding revealed patient in room. No complaints, stable, in no acute stress. Q15 minute rounds and monitoring via Engineer, drilling to continue.

## 2020-06-21 NOTE — BH Assessment (Signed)
Assessment Note  Janice [name] is an 86 y.o. female presenting to [facility] ED voluntarily. Per triage note Pt here with son from [facility] cardiology. Per son, pt has been softly crying, and had progressive altered mental status. Issues first started in June when she had a back surgery. Both of pt's sons take care of pt and live with her. Per son, she doesn't sleep at all at night. During assessment patient appears alert and oriented x3, patient is unaware of where she is and reports that she is in a nursing home. Upon presentation patient is laying in the bed crying and upset she reports "I'm crying because I'm uncomfortable, I want to make a bowel movement, I'm not miserable all the time." Patient denies any current psychiatrist or outpatient treatment, but has a history of depression and was seeing a psychiatrist in the past per her sons. She reports living with her 2 sons "they are very kind to me." Patient denies SI/HI/AH/VH and does not appear to be responding to any internal or external stimuli.  Per Psyc NP Ysidro Evert patient to be observed overnight and reassessed in the morning  Diagnosis: Altered Mental Status  Past Medical History:  Past Medical History:  Diagnosis Date  . 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 Colon cancer ([place]) 2003   adenocarcinoma of the ascending colon, 2007  . Environmental allergies   . High cholesterol   . Hypertension   . Large cell lymphoma ([place]) 2007   Diffuse large cell lymphoma, completed [facility] and Rituxan in March 2004  . Non Hodgkin's lymphoma ([place]) 2003  . Personal history of chemotherapy 2003   Non Hodgkins  . Skin cancer     Past Surgical History:  Procedure Laterality Date  . BREAST BIOPSY Right 03/18/2016   DCIS  . BREAST BIOPSY Right 08/19/2017   ribbon marker, Fibrocystic changes- neg  . BREAST LUMPECTOMY Right 04/24/2016   DCIS clear margins. SN biopsy-negative. Declined rad tx.   Marland Kitchen BREAST LUMPECTOMY WITH NEEDLE LOCALIZATION  Right 04/24/2016   Procedure: BREAST LUMPECTOMY WITH NEEDLE LOCALIZATION;  Surgeon: Jules Husbands, MD;  Location: [facility] ORS;  Service: General;  Laterality: Right;  . COLON SURGERY  11/24/05   Cancer surgery  . COLONOSCOPY     2012  . COLONOSCOPY N/A 08/28/2015   Procedure: COLONOSCOPY;  Surgeon: Hulen Luster, MD;  Location: [place];  Service: Gastroenterology;  Laterality: N/A;  . DIAGNOSTIC MAMMOGRAM     May 2014  . KYPHOPLASTY N/A 03/28/2020   Procedure: T9 AND T12 KYPHOPLASTY;  Surgeon: Hessie Knows, MD;  Location: [facility] ORS;  Service: Orthopedics;  Laterality: N/A;  . LYMPH NODE DISSECTION  2003   neck - non-hodgkin's lymphoma  . SACROPLASTY N/A 01/04/2020   Procedure: SACROPLASTY;  Surgeon: Hessie Knows, MD;  Location: [facility] ORS;  Service: Orthopedics;  Laterality: N/A;  . SENTINEL NODE BIOPSY Right 04/24/2016   Procedure: SENTINEL NODE BIOPSY;  Surgeon: Jules Husbands, MD;  Location: [facility] ORS;  Service: General;  Laterality: Right;    Family History:  Family History  Problem Relation Age of Onset  . Breast cancer Sister 53       half sister  . Cancer Other        family history of colon cancer, breast cancer, lung cancer  . Cancer Paternal Uncle        colon  . Colon cancer Maternal Uncle   . Stomach cancer Maternal Grandmother   .  Heart attack Mother 7  . Hypertension Mother   . Other Father        "old age"    Social History:  reports that she has never smoked. She has never used smokeless tobacco. She reports that she does not drink alcohol and does not use drugs.  Additional Social History:  Alcohol / Drug Use Pain Medications: See MAR Prescriptions: See MAR Over the Counter: See MAR History of alcohol / drug use?: No history of alcohol / drug abuse  CIWA: CIWA-Ar BP: 137/72 Pulse Rate: 83 COWS:    Allergies: No Known Allergies  Home Medications: (Not in a hospital admission)   OB/GYN Status:  No LMP recorded. Patient is postmenopausal.  General  Assessment Data Location of Assessment: [facility] ED TTS Assessment: In system Is this a Tele or Face-to-Face Assessment?: Face-to-Face Is this an Initial Assessment or a Re-assessment for this encounter?: Initial Assessment Patient Accompanied by:: N/A Language Other than English: No Living Arrangements: Other (Comment) (Private Residence) What gender do you identify as?: Female Marital status: Single Pregnancy Status: No Living Arrangements: Children Can pt return to current living arrangement?: Yes Admission Status: Voluntary Is patient capable of signing voluntary admission?: Yes Referral Source: Self/Family/Friend Insurance type: [facility]  Medical Screening Exam ([place]) Medical Exam completed: Yes  Crisis Care Plan Living Arrangements: Children Legal Guardian: Other: (Self) Name of Psychiatrist: None Name of Therapist: None  Education Status Is patient currently in school?: No Is the patient employed, unemployed or receiving disability?: Receiving disability income  Risk to self with the past 6 months Suicidal Ideation: No Has patient been a risk to self within the past 6 months prior to admission? : No Suicidal Intent: No Has patient had any suicidal intent within the past 6 months prior to admission? : No Is patient at risk for suicide?: No Suicidal Plan?: No Has patient had any suicidal plan within the past 6 months prior to admission? : No Access to Means: No What has been your use of drugs/alcohol within the last 12 months?: None Previous Attempts/Gestures: No How many times?: 0 Other Self Harm Risks: None Triggers for Past Attempts: None known Intentional Self Injurious Behavior: None Family Suicide History: No Recent stressful life event(s): Other (Comment) (Physical health issues) Persecutory voices/beliefs?: No Depression: Yes Depression Symptoms: Tearfulness, Feeling worthless/self pity Substance abuse history and/or treatment for substance abuse?:  No Suicide prevention information given to non-admitted patients: Not applicable  Risk to Others within the past 6 months Homicidal Ideation: No Does patient have any lifetime risk of violence toward others beyond the six months prior to admission? : No Thoughts of Harm to Others: No Current Homicidal Intent: No Current Homicidal Plan: No Access to Homicidal Means: No Identified Victim: None History of harm to others?: No Assessment of Violence: None Noted Violent Behavior Description: None Does patient have access to weapons?: No Criminal Charges Pending?: No Does patient have a court date: No Is patient on probation?: No  Psychosis Hallucinations: None noted Delusions: None noted  Mental Status Report Appearance/Hygiene: Unremarkable Eye Contact: Fair Motor Activity: Freedom of movement Speech: Logical/coherent Level of Consciousness: Alert, Crying Mood: Depressed, Sad Affect: Depressed, Sad Anxiety Level: Minimal Thought Processes: Circumstantial Judgement: Partial Orientation: Person, Time, Situation Obsessive Compulsive Thoughts/Behaviors: None  Cognitive Functioning Concentration: Normal Memory: Remote Intact, Recent Intact Is patient IDD: No Insight: Fair Impulse Control: Good Appetite: Fair Have you had any weight changes? : No Change Sleep: Decreased Total Hours of Sleep: 4 Vegetative  Symptoms: None  ADLScreening [facility] Assessment Services) Patient's cognitive ability adequate to safely complete daily activities?: Yes Patient able to express need for assistance with ADLs?: Yes Independently performs ADLs?: No  Prior Inpatient Therapy Prior Inpatient Therapy: No  Prior Outpatient Therapy Prior Outpatient Therapy: Yes Prior Therapy Dates: Unknown Prior Therapy Facilty/Provider(s): Unknown Reason for Treatment: Depression Does patient have an ACCT team?: No Does patient have Intensive In-House Services?  : No Does patient have [facility] services? :  No Does patient have P4CC services?: No  ADL Screening (condition at time of admission) Patient's cognitive ability adequate to safely complete daily activities?: Yes Is the patient deaf or have difficulty hearing?: No Does the patient have difficulty seeing, even when wearing glasses/contacts?: No Does the patient have difficulty concentrating, remembering, or making decisions?: Yes Patient able to express need for assistance with ADLs?: Yes Does the patient have difficulty dressing or bathing?: Yes Independently performs ADLs?: No Communication: Independent Dressing (OT): Needs assistance Grooming: Needs assistance Feeding: Needs assistance Bathing: Needs assistance Toileting: Needs assistance In/Out Bed: Needs assistance Walks in Home: Needs assistance Does the patient have difficulty walking or climbing stairs?: Yes  Home Assistive Devices/Equipment Home Assistive Devices/Equipment: Wheelchair, Environmental consultant (specify type)  Therapy Consults (therapy consults require a physician order) PT Evaluation Needed: No OT Evalulation Needed: No SLP Evaluation Needed: No Abuse/Neglect Assessment (Assessment to be complete while patient is alone) Abuse/Neglect Assessment Can Be Completed: Yes Physical Abuse: Denies Verbal Abuse: Denies Sexual Abuse: Denies Exploitation of patient/patient's resources: Denies Self-Neglect: Denies Values / Beliefs Cultural Requests During Hospitalization: None Spiritual Requests During Hospitalization: None Consults Spiritual Care Consult Needed: No Transition of Care Team Consult Needed: No Advance Directives (For Healthcare) Does Patient Have a Medical Advance Directive?: Yes Type of Advance Directive: Healthcare Power of Attorney          Disposition: Per Psyc NP Ysidro Evert patient to be observed overnight and reassessed in the morning Disposition Initial Assessment Completed for this Encounter: Yes  On Site Evaluation by:   Reviewed with  Physician:    Leonie Douglas MS LCASA 06/21/2020 4:18 AM

## 2020-06-21 NOTE — ED Notes (Signed)
This tech and Gwynn Burly, RN assisted Pt to bathroom, Pt was in distress and crying, stated " I don't know how this could get any worse". Pt was compliant and started to relax once returned to bed. Call light within reach, this tech informed Pt to press the call light if in need of assistance.

## 2020-06-21 NOTE — Discharge Instructions (Signed)
Please seek medical attention for any high fevers, chest pain, shortness of breath, change in behavior, persistent vomiting, bloody stool or any other new or concerning symptoms.  

## 2020-06-21 NOTE — Consult Note (Signed)
[facility] Consult   Reason for Consult: Consult for 86 year old woman with multiple medical problems presenting with some changes in mood and activity level Referring Physician: Paduchowski Patient Identification: Janice [name] MRN:  [id number] Principal Diagnosis: Depression, major, single episode, mild ([place]) Diagnosis:  Principal Problem:   Depression, major, single episode, mild ([place]) Active Problems:   AMS (altered mental status)   Mild dementia ([place])   Total Time spent with patient: 1 hour  Subjective:   Janice [name] is a 86 y.o. female patient admitted with "I just want to cope better".  HPI: Patient seen chart reviewed.  Also spoke with her son by telephone.  36 year old woman brought to the emergency room by family with concerns about progressive loss of function, declining mood and mental acuity.  Son reports that this past year particularly as she has had more medical problems she has become less active and less able to care for herself.  Often now will not get up out of bed on her own.  Son is clearly feeling overwhelmed with the care that he and his brother are needing to provide for their mother.  Patient admits that her mood stays bad a fair bit of the time.  Tends to think negatively when she thinks about her life.  She denies any psychotic symptoms.  She denies any change in appetite.  Admits that sleep can be erratic at times.  She denies any suicidal thoughts or wish.  Patient is not on any antidepressant medicine.  She had been started on low-dose Risperdal with her last hospitalization but son confirms they have not been giving it to her.  Past Psychiatric History: Patient has no previous psychiatric history other than mild dementia.  No previous depression no hospitalizations no suicidal behavior  Risk to Self: Suicidal Ideation: No Suicidal Intent: No Is patient at risk for suicide?: No Suicidal Plan?: No Access to Means: No What has been your use of  drugs/alcohol within the last 12 months?: None How many times?: 0 Other Self Harm Risks: None Triggers for Past Attempts: None known Intentional Self Injurious Behavior: None Risk to Others: Homicidal Ideation: No Thoughts of Harm to Others: No Current Homicidal Intent: No Current Homicidal Plan: No Access to Homicidal Means: No Identified Victim: None History of harm to others?: No Assessment of Violence: None Noted Violent Behavior Description: None Does patient have access to weapons?: No Criminal Charges Pending?: No Does patient have a court date: No Prior Inpatient Therapy: Prior Inpatient Therapy: No Prior Outpatient Therapy: Prior Outpatient Therapy: Yes Prior Therapy Dates: Unknown Prior Therapy Facilty/Provider(s): Unknown Reason for Treatment: Depression Does patient have an ACCT team?: No Does patient have Intensive In-House Services?  : No Does patient have [facility] services? : No Does patient have P4CC services?: No  Past Medical History:  Past Medical History:  Diagnosis Date  . 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 Colon cancer ([place]) 2003   adenocarcinoma of the ascending colon, 2007  . Environmental allergies   . High cholesterol   . Hypertension   . Large cell lymphoma ([place]) 2007   Diffuse large cell lymphoma, completed [facility] and Rituxan in March 2004  . Non Hodgkin's lymphoma ([place]) 2003  . Personal history of chemotherapy 2003   Non Hodgkins  . Skin cancer     Past Surgical History:  Procedure Laterality Date  . BREAST BIOPSY Right 03/18/2016   DCIS  . BREAST BIOPSY Right 08/19/2017  ribbon marker, Fibrocystic changes- neg  . BREAST LUMPECTOMY Right 04/24/2016   DCIS clear margins. SN biopsy-negative. Declined rad tx.   Marland Kitchen BREAST LUMPECTOMY WITH NEEDLE LOCALIZATION Right 04/24/2016   Procedure: BREAST LUMPECTOMY WITH NEEDLE LOCALIZATION;  Surgeon: Jules Husbands, MD;  Location: [facility];  Service: General;  Laterality: Right;  . COLON  SURGERY  11/24/05   Cancer surgery  . COLONOSCOPY     2012  . COLONOSCOPY N/A 08/28/2015   Procedure: COLONOSCOPY;  Surgeon: Hulen Luster, MD;  Location: [facility];  Service: Gastroenterology;  Laterality: N/A;  . DIAGNOSTIC MAMMOGRAM     May 2014  . KYPHOPLASTY N/A 03/28/2020   Procedure: T9 AND T12 KYPHOPLASTY;  Surgeon: Hessie Knows, MD;  Location: [facility];  Service: Orthopedics;  Laterality: N/A;  . LYMPH NODE DISSECTION  2003   neck - non-hodgkin's lymphoma  . SACROPLASTY N/A 01/04/2020   Procedure: SACROPLASTY;  Surgeon: Hessie Knows, MD;  Location: [facility];  Service: Orthopedics;  Laterality: N/A;  . SENTINEL NODE BIOPSY Right 04/24/2016   Procedure: SENTINEL NODE BIOPSY;  Surgeon: Jules Husbands, MD;  Location: [facility];  Service: General;  Laterality: Right;   Family History:  Family History  Problem Relation Age of Onset  . Breast cancer Sister 11       half sister  . Cancer Other        family history of colon cancer, breast cancer, lung cancer  . Cancer Paternal Uncle        colon  . Colon cancer Maternal Uncle   . Stomach cancer Maternal Grandmother   . Heart attack Mother 63  . Hypertension Mother   . Other Father        "old age"   Family Psychiatric  History: None known Social History:  Social History   Substance and Sexual Activity  Alcohol Use No     Social History   Substance and Sexual Activity  Drug Use No    Social History   Socioeconomic History  . Marital status: Widowed    Spouse name: Not on file  . Number of children: Not on file  . Years of education: Not on file  . Highest education level: Not on file  Occupational History  . Not on file  Tobacco Use  . Smoking status: Never Smoker  . Smokeless tobacco: Never Used  Vaping Use  . Vaping Use: Never used  Substance and Sexual Activity  . Alcohol use: No  . Drug use: No  . Sexual activity: Never  Other Topics Concern  . Not on file  Social History Narrative  . Not on file     Social Determinants of Health   Financial Resource Strain:   . Difficulty of Paying Living Expenses: Not on file  Food Insecurity:   . Worried About Charity fundraiser in the Last Year: Not on file  . Ran Out of Food in the Last Year: Not on file  Transportation Needs:   . Lack of Transportation (Medical): Not on file  . Lack of Transportation (Non-Medical): Not on file  Physical Activity:   . Days of Exercise per Week: Not on file  . Minutes of Exercise per Session: Not on file  Stress:   . Feeling of Stress : Not on file  Social Connections:   . Frequency of Communication with Friends and Family: Not on file  . Frequency of Social Gatherings with Friends and Family: Not on file  .  Attends Religious Services: Not on file  . Active Member of Clubs or Organizations: Not on file  . Attends Archivist Meetings: Not on file  . Marital Status: Not on file   Additional Social History:    Allergies:  No Known Allergies  Labs:  Results for orders placed or performed during the hospital encounter of 06/20/20 (from the past 48 hour(s))  Urinalysis, Routine w reflex microscopic     Status: Abnormal   Collection Time: 06/20/20  1:58 PM  Result Value Ref Range   Color, Urine AMBER (A) YELLOW    Comment: BIOCHEMICALS MAY BE AFFECTED BY COLOR   APPearance CLOUDY (A) CLEAR   Specific Gravity, Urine 1.032 (H) 1.005 - 1.030   pH 5.0 5.0 - 8.0   Glucose, UA NEGATIVE NEGATIVE mg/dL   Hgb urine dipstick NEGATIVE NEGATIVE   Bilirubin Urine NEGATIVE NEGATIVE   Ketones, ur NEGATIVE NEGATIVE mg/dL   Protein, ur >=300 (A) NEGATIVE mg/dL   Nitrite NEGATIVE NEGATIVE   Leukocytes,Ua TRACE (A) NEGATIVE   RBC / HPF 11-20 0 - 5 RBC/hpf   WBC, UA >50 (H) 0 - 5 WBC/hpf   Bacteria, UA RARE (A) NONE SEEN   Squamous Epithelial / LPF 21-50 0 - 5   WBC Clumps PRESENT    Mucus PRESENT    Hyaline Casts, UA PRESENT     Comment: Performed at [facility], [address]  [place], [place] [address]  Comprehensive metabolic panel     Status: Abnormal   Collection Time: 06/20/20  2:04 PM  Result Value Ref Range   Sodium 133 (L) 135 - 145 mmol/L   Potassium 4.2 3.5 - 5.1 mmol/L   Chloride 91 (L) 98 - 111 mmol/L   CO2 33 (H) 22 - 32 mmol/L   Glucose, Bld 112 (H) 70 - 99 mg/dL    Comment: Glucose reference range applies only to samples taken after fasting for at least 8 hours.   BUN 20 8 - 23 mg/dL   Creatinine, Ser 0.95 0.44 - 1.00 mg/dL   Calcium 8.6 (L) 8.9 - 10.3 mg/dL   Total Protein 6.2 (L) 6.5 - 8.1 g/dL   Albumin 3.2 (L) 3.5 - 5.0 g/dL   AST 26 15 - 41 U/L   ALT 56 (H) 0 - 44 U/L   Alkaline Phosphatase 50 38 - 126 U/L   Total Bilirubin 0.7 0.3 - 1.2 mg/dL   GFR, Estimated 57 (L) >60 mL/min    Comment: (NOTE) Calculated using the CKD-EPI Creatinine Equation (2021)    Anion gap 9 5 - 15    Comment: Performed at [facility], [address] [place], [place] [address]  CBC     Status: Abnormal   Collection Time: 06/20/20  2:04 PM  Result Value Ref Range   WBC 5.8 4.0 - 10.5 K/uL   RBC 3.50 (L) 3.87 - 5.11 MIL/uL   Hemoglobin 10.5 (L) 12.0 - 15.0 g/dL   HCT 33.9 (L) 36 - 46 %   MCV 96.9 80.0 - 100.0 fL   MCH 30.0 26.0 - 34.0 pg   MCHC 31.0 30.0 - 36.0 g/dL   RDW 15.4 11.5 - 15.5 %   Platelets 292 150 - 400 K/uL   nRBC 0.0 0.0 - 0.2 %    Comment: Performed at [facility], [address] [place], [place] [address]  CBG monitoring, ED     Status: Abnormal   Collection Time: 06/20/20  2:09  PM  Result Value Ref Range   Glucose-Capillary 100 (H) 70 - 99 mg/dL    Comment: Glucose reference range applies only to samples taken after fasting for at least 8 hours.   Comment 1 Notify RN    Comment 2 Document in Chart     Current Facility-Administered Medications  Medication Dose Route Frequency Provider Last Rate Last Admin  . aspirin EC tablet 81 mg  81 mg Oral Daily Paulette Blanch, MD      . diltiazem (CARDIZEM CD) 24 hr capsule  120 mg  120 mg Oral Daily Paulette Blanch, MD      . lisinopril (ZESTRIL) tablet 20 mg  20 mg Oral Daily Paulette Blanch, MD      . mirtazapine (REMERON) tablet 7.5 mg  7.5 mg Oral QHS Breeanne Oblinger T, MD      . tamoxifen (NOLVADEX) tablet 20 mg  20 mg Oral Daily Paulette Blanch, MD       Current Outpatient Medications  Medication Sig Dispense Refill  . acetaminophen (TYLENOL) 500 MG tablet Take 500 mg by mouth every 6 (six) hours as needed.    Marland Kitchen alendronate (FOSAMAX) 70 MG tablet Take 70 mg by mouth once a week.     Marland Kitchen amLODipine (NORVASC) 10 MG tablet Take 10 mg by mouth daily.     Marland Kitchen aspirin 81 MG tablet Take 81 mg by mouth daily.    Marland Kitchen lisinopril (PRINIVIL,ZESTRIL) 20 MG tablet Take 20 mg by mouth daily.    . mirtazapine (REMERON) 7.5 MG tablet Take 1 tablet (7.5 mg total) by mouth at bedtime. 30 tablet 1  . polyethylene glycol (MIRALAX / GLYCOLAX) 17 g packet Take 17 g by mouth daily as needed for mild constipation. 14 each 0  . Pseudoeph-Doxylamine-DM-APAP (NYQUIL PO) Take 30 mLs by mouth daily as needed (cough).    . risperiDONE (RISPERDAL M-TABS) 0.5 MG disintegrating tablet Take 1 tablet (0.5 mg total) by mouth 2 (two) times daily.    . tamoxifen (NOLVADEX) 20 MG tablet Take 1 tablet (20 mg total) by mouth daily. Patient MUST make an appointment to be seen before another refill will be approved 90 tablet 0  . Vitamin D, Ergocalciferol, (DRISDOL) 1.25 MG (50000 UT) CAPS capsule Take 50,000 Units by mouth every 7 (seven) days.       Musculoskeletal: Strength & Muscle Tone: decreased Gait & Station: unable to stand Patient leans: N/A  Psychiatric Specialty Exam: Physical Exam Vitals and nursing note reviewed.  Constitutional:      Appearance: She is well-developed.  HENT:     Head: Normocephalic and atraumatic.  Eyes:     Conjunctiva/sclera: Conjunctivae normal.     Pupils: Pupils are equal, round, and reactive to light.  Cardiovascular:     Heart sounds: Normal heart sounds.    Pulmonary:    Abdominal:     Palpations: Abdomen is soft.  Musculoskeletal:        General: Normal range of motion.     Cervical back: Normal range of motion.  Skin:    General: Skin is warm and dry.  Neurological:     General: No focal deficit present.     Mental Status: She is alert.  Psychiatric:        Attention and Perception: Attention normal.        Mood and Affect: Mood is anxious and depressed.        Speech: Speech normal.  Behavior: Behavior is withdrawn.        Thought Content: Thought content is not paranoid or delusional. Thought content does not include homicidal or suicidal ideation.        Cognition and Memory: Memory is impaired.        Judgment: Judgment normal.     Review of Systems  Constitutional: Positive for activity change and fatigue.  HENT: Negative.   Eyes: Negative.   Respiratory: Negative.   Cardiovascular: Negative.   Gastrointestinal: Negative.   Musculoskeletal: Negative.   Skin: Negative.   Neurological: Negative.   Psychiatric/Behavioral: Positive for dysphoric mood and sleep disturbance. Negative for hallucinations, self-injury and suicidal ideas. The patient is not hyperactive.     Blood pressure 137/72, pulse 83, temperature (!) 97.4 F (36.3 C), temperature source Oral, resp. rate 16, height 5\' 4"  (1.626 m), weight 73.9 kg, SpO2 93 %.Body mass index is 27.97 kg/m.  General Appearance: Disheveled  Eye Contact:  Minimal  Speech:  Slow  Volume:  Decreased  Mood:  Dysphoric  Affect:  Congruent  Thought Process:  Goal Directed  Orientation:  Full (Time, Place, and Person)  Thought Content:  Logical  Suicidal Thoughts:  No  Homicidal Thoughts:  No  Memory:  Immediate;   Fair Recent;   Poor Remote;   Fair  Judgement:  Fair  Insight:  Fair  Psychomotor Activity:  Decreased  Concentration:  Concentration: Poor  Recall:  Poor  Fund of Knowledge:  Fair  Language:  Fair  Akathisia:  No  Handed:  Right  AIMS (if  indicated):     Assets:  Desire for Improvement Housing Resilience Social Support  ADL's:  Impaired  Cognition:  Impaired,  Mild  Sleep:        Treatment Plan Summary: Medication management and Plan 86 year old woman with symptoms consistent with mild to moderate depression.  No indication of suicidal thoughts or behavior.  No evidence of psychosis.  Condition complicated by multiple medical problems including congestive heart failure adding to her weakness and general lack of feeling well.  Patient does not appear to require inpatient hospitalization.  I recommend discontinuing the Risperdal (although it appears it was not being given anyway) and starting low-dose antidepressant starting with mirtazapine 7.5 mg at night.  Situation explained to son who is agreeable to medication changes.  Prescription written.  The more I talk with her son the more clear it became that he and his brother feel somewhat desperate about their living situation with the mother.  I have put in a consult to [facility] to communicate with them to see if perhaps there is a different disposition needed such as rehab or assisted living or skilled nursing.  Case reviewed with emergency room doctor and TTS.  Disposition: Patient does not meet criteria for psychiatric inpatient admission. Supportive therapy provided about ongoing stressors. Discussed crisis plan, support from social network, calling 911, coming to the Emergency Department, and calling Suicide Hotline.  Alethia Berthold, MD 06/21/2020 11:21 AM

## 2020-06-21 NOTE — ED Notes (Signed)
Pt placed in hospital bed for comfort. Bed alarm in place with call bell within reach. Purewick in place and connected to suction.

## 2020-07-25 ENCOUNTER — Other Ambulatory Visit: Payer: Self-pay | Admitting: Hematology and Oncology

## 2020-07-25 DIAGNOSIS — D0511 Intraductal carcinoma in situ of right breast: Secondary | ICD-10-CM

## 2020-08-20 DEATH — deceased

## 2020-10-09 ENCOUNTER — Inpatient Hospital Stay: Admission: RE | Admit: 2020-10-09 | Payer: Medicare Other | Source: Ambulatory Visit

## 2020-10-16 ENCOUNTER — Other Ambulatory Visit: Payer: Medicare Other

## 2020-10-16 ENCOUNTER — Ambulatory Visit: Payer: Medicare Other | Admitting: Hematology and Oncology
